# Patient Record
Sex: Male | Born: 1935 | Race: White | Hispanic: No | Smoking: Never smoker
Health system: Southern US, Community
[De-identification: ages and names within clinical notes are randomized; demographics above are authoritative.]

## PROBLEM LIST (undated history)

## (undated) DIAGNOSIS — I2510 Atherosclerotic heart disease of native coronary artery without angina pectoris: Secondary | ICD-10-CM

## (undated) DIAGNOSIS — I10 Essential (primary) hypertension: Secondary | ICD-10-CM

## (undated) DIAGNOSIS — C61 Malignant neoplasm of prostate: Secondary | ICD-10-CM

## (undated) DIAGNOSIS — N170 Acute kidney failure with tubular necrosis: Secondary | ICD-10-CM

## (undated) DIAGNOSIS — J1282 Pneumonia due to coronavirus disease 2019: Secondary | ICD-10-CM

## (undated) DIAGNOSIS — M109 Gout, unspecified: Secondary | ICD-10-CM

## (undated) DIAGNOSIS — N200 Calculus of kidney: Secondary | ICD-10-CM

## (undated) DIAGNOSIS — R32 Unspecified urinary incontinence: Secondary | ICD-10-CM

## (undated) DIAGNOSIS — N189 Chronic kidney disease, unspecified: Secondary | ICD-10-CM

## (undated) DIAGNOSIS — M5090 Cervical disc disorder, unspecified, unspecified cervical region: Secondary | ICD-10-CM

## (undated) DIAGNOSIS — Z955 Presence of coronary angioplasty implant and graft: Secondary | ICD-10-CM

## (undated) DIAGNOSIS — U071 COVID-19: Secondary | ICD-10-CM

## (undated) DIAGNOSIS — Z950 Presence of cardiac pacemaker: Secondary | ICD-10-CM

## (undated) HISTORY — DX: Presence of coronary angioplasty implant and graft: Z95.5

## (undated) HISTORY — DX: Cervical disc disorder, unspecified, unspecified cervical region: M50.90

## (undated) HISTORY — PX: SPIROMETRY: SHX456

## (undated) HISTORY — DX: Malignant neoplasm of prostate: C61

## (undated) HISTORY — DX: COVID-19: U07.1

## (undated) HISTORY — PX: SACROILIAC JOINT ARTHRODESIS: SUR61

## (undated) HISTORY — DX: Essential (primary) hypertension: I10

## (undated) HISTORY — PX: PROSTATECTOMY: SHX69

## (undated) HISTORY — PX: OTHER SURGICAL HISTORY: SHX169

## (undated) HISTORY — DX: Chronic kidney disease, unspecified: N18.9

## (undated) HISTORY — PX: ANKLE FRACTURE SURGERY: SHX122

## (undated) HISTORY — PX: COLONOSCOPY: SHX174

## (undated) HISTORY — PX: ROTATOR CUFF REPAIR: SHX139

## (undated) HISTORY — PX: EXPLORATORY LAPAROTOMY: SUR591

## (undated) HISTORY — DX: Pneumonia due to coronavirus disease 2019: J12.82

## (undated) HISTORY — DX: Unspecified urinary incontinence: R32

## (undated) HISTORY — DX: Presence of cardiac pacemaker: Z95.0

## (undated) HISTORY — DX: Gout, unspecified: M10.9

## (undated) HISTORY — DX: Calculus of kidney: N20.0

## (undated) HISTORY — DX: Acute kidney failure with tubular necrosis: N17.0

## (undated) HISTORY — DX: Atherosclerotic heart disease of native coronary artery without angina pectoris: I25.10

## (undated) HISTORY — PX: CERVICAL DISCECTOMY: SHX98

## (undated) HISTORY — PX: RADIOFREQUENCY ABLATION: SHX2290

---

## 2019-11-17 ENCOUNTER — Other Ambulatory Visit: Payer: Self-pay

## 2019-11-17 ENCOUNTER — Ambulatory Visit (INDEPENDENT_AMBULATORY_CARE_PROVIDER_SITE_OTHER): Payer: Medicare FFS | Admitting: Cardiology

## 2019-11-17 ENCOUNTER — Encounter: Payer: Self-pay | Admitting: Cardiology

## 2019-11-17 DIAGNOSIS — I10 Essential (primary) hypertension: Secondary | ICD-10-CM | POA: Diagnosis not present

## 2019-11-17 DIAGNOSIS — M109 Gout, unspecified: Secondary | ICD-10-CM | POA: Insufficient documentation

## 2019-11-17 DIAGNOSIS — D510 Vitamin B12 deficiency anemia due to intrinsic factor deficiency: Secondary | ICD-10-CM

## 2019-11-17 DIAGNOSIS — Z950 Presence of cardiac pacemaker: Secondary | ICD-10-CM | POA: Insufficient documentation

## 2019-11-17 DIAGNOSIS — K219 Gastro-esophageal reflux disease without esophagitis: Secondary | ICD-10-CM | POA: Insufficient documentation

## 2019-11-17 DIAGNOSIS — M1990 Unspecified osteoarthritis, unspecified site: Secondary | ICD-10-CM

## 2019-11-17 DIAGNOSIS — E7800 Pure hypercholesterolemia, unspecified: Secondary | ICD-10-CM | POA: Diagnosis not present

## 2019-11-17 NOTE — Patient Instructions (Signed)
Medication Instructions:  Your physician recommends that you continue on your current medications as directed. Please refer to the Current Medication list given to you today.  *If you need a refill on your cardiac medications before your next appointment, please call your pharmacy*  Lab Work: None.  If you have labs (blood work) drawn today and your tests are completely normal, you will receive your results only by: Marland Kitchen MyChart Message (if you have MyChart) OR . A paper copy in the mail If you have any lab test that is abnormal or we need to change your treatment, we will call you to review the results.  Testing/Procedures: None.   Follow-Up: At [facility], you and your health needs are our priority.  As part of our continuing mission to provide you with exceptional heart care, we have created designated Provider Care Teams.  These Care Teams include your primary Cardiologist (physician) and Advanced Practice Providers (APPs -  Physician Assistants and Nurse Practitioners) who all work together to provide you with the care you need, when you need it.  Your next appointment:   6 week(s)  The format for your next appointment:   In Person  Provider:   Jenne Campus, MD  Other Instructions   Please come to office Friday Feb. 19th at 10 am to have device interrogated.

## 2019-11-17 NOTE — Progress Notes (Signed)
Cardiology Consultation:    Date:  11/17/2019   ID:  Steven Rosario, DOB Sep 02, 1929, MRN [id number]  PCP:  Steven Sheriff, MD  Cardiologist:  Steven Campus, MD   Referring MD: Steven Sheriff, MD   No chief complaint on file. I need to be established with cardiologist service  History of Present Illness:    Steven Rosario is a 86 y.o. male who is being seen today for the evaluation of history of pacemaker at the request of Steven Rosario, Steven Blonder, MD.  Very nice gentleman who recently relocated from [place].  His wife of more than 60 years died 2 years ago since that time he is being very lonely decided to move into [place] to be closer to his son.  Cardiac wise seems to be doing well.  In 2018 he received a pacemaker he is not sure exactly why he got it he told me that Dr. Michela Rosario that he needed so he got it.  Since that time he seems to be doing well cardiac wise.  Denies having any myocardial infarction, no shortness of breath. Within last few months however he required 2 abdominal surgeries he is not sure exactly why the surgery was done but eventually he said he got significant portion of his small bowel removed.  He went to surgery with no major issues and seems to be doing well overall.  He purchased a house here in [place] and decided to leave here.  In spite of his advanced age 39 years old he is living alone.  His son is leaving for 1/2 miles away from his house.  History reviewed. No pertinent past medical history.  History reviewed. No pertinent surgical history.  Current Medications: Current Meds  Medication Sig  . allopurinol (ZYLOPRIM) 300 MG tablet Take 300 mg by mouth daily.  Marland Kitchen amiodarone (PACERONE) 200 MG tablet Take 200 mg by mouth daily.  Marland Kitchen amLODipine (NORVASC) 10 MG tablet Take 10 mg by mouth daily.  Marland Kitchen apixaban (ELIQUIS) 2.5 MG TABS tablet Take 2.5 mg by mouth 2 (two) times daily.  . Ascorbic Acid (VITA-C PO) Take 500 mg by mouth daily.  Marland Kitchen aspirin 81  MG EC tablet Take 81 mg by mouth daily.  Marland Kitchen atorvastatin (LIPITOR) 20 MG tablet Take 20 mg by mouth daily.  . bumetanide (BUMEX) 1 MG tablet Take 1 mg by mouth daily.  . Cholecalciferol (VITAMIN D-3 PO) Take 2,000 Units by mouth daily.  Marland Kitchen GLUCOSAMINE-CHONDROITIN PO Take 1 tablet by mouth in the morning and at bedtime. 5 mg twice a day  . metoprolol succinate (TOPROL-XL) 50 MG 24 hr tablet Take 50 mg by mouth daily. Take with or immediately following a meal.  . oxybutynin (DITROPAN-XL) 5 MG 24 hr tablet Take 5 mg by mouth at bedtime.  . pantoprazole (PROTONIX) 20 MG tablet Take 20 mg by mouth daily.  . vitamin B-12 (CYANOCOBALAMIN) 1000 MCG tablet Take 1,000 mcg by mouth daily.     Allergies:   Patient has no allergy information on record.   Social History   Socioeconomic History  . Marital status: Unknown    Spouse name: Not on file  . Number of children: Not on file  . Years of education: Not on file  . Highest education level: Not on file  Occupational History  . Not on file  Tobacco Use  . Smoking status: Never Smoker  . Smokeless tobacco: Never Used  Substance and Sexual Activity  . Alcohol  use: Not on file  . Drug use: Not on file  . Sexual activity: Not on file  Other Topics Concern  . Not on file  Social History Narrative  . Not on file   Social Determinants of Health   Financial Resource Strain:   . Difficulty of Paying Living Expenses: Not on file  Food Insecurity:   . Worried About Charity fundraiser in the Last Year: Not on file  . Ran Out of Food in the Last Year: Not on file  Transportation Needs:   . Lack of Transportation (Medical): Not on file  . Lack of Transportation (Non-Medical): Not on file  Physical Activity:   . Days of Exercise per Week: Not on file  . Minutes of Exercise per Session: Not on file  Stress:   . Feeling of Stress : Not on file  Social Connections:   . Frequency of Communication with Friends and Family: Not on file  . Frequency  of Social Gatherings with Friends and Family: Not on file  . Attends Religious Services: Not on file  . Active Member of Clubs or Organizations: Not on file  . Attends Archivist Meetings: Not on file  . Marital Status: Not on file     Family History: The patient's family history is not on file. ROS:   Please see the history of present illness.    All 14 point review of systems negative except as described per history of present illness.  EKGs/Labs/Other Studies Reviewed:    The following studies were reviewed today:   EKG:  EKG is  ordered today.  The ekg ordered today demonstrates normal sinus rhythm, prolonged PR interval, questionable atrial pacing.  Intraventricular conduction delay left bundle branch block type.  Recent Labs: No results found for requested labs within last 8760 hours.  Recent Lipid Panel No results found for: CHOL, TRIG, HDL, CHOLHDL, VLDL, LDLCALC, LDLDIRECT  Physical Exam:    VS:  BP 124/64   Pulse 61   Ht 5\' 10"  (1.778 m)   Wt 182 lb 9.6 oz (82.8 kg)   SpO2 97%   BMI 26.20 kg/m     Wt Readings from Last 3 Encounters:  11/17/19 182 lb 9.6 oz (82.8 kg)     GEN:  Well nourished, well developed in no acute distress HEENT: Normal NECK: No JVD; No carotid bruits LYMPHATICS: No lymphadenopathy CARDIAC: RRR, no murmurs, no rubs, no gallops RESPIRATORY:  Clear to auscultation without rales, wheezing or rhonchi, pacemaker located in the left infraclavicular area skin is intact on top of it. ABDOMEN: Soft, non-tender, non-distended MUSCULOSKELETAL:  No edema; No deformity  SKIN: Warm and dry NEUROLOGIC:  Alert and oriented x 3 PSYCHIATRIC:  Normal affect   ASSESSMENT:    1. Essential hypertension, benign   2. Hypercholesteremia   3. Pacemaker   4. Senile arthritis   5. Pernicious anemia    PLAN:    In order of problems listed above:  1. Pacemaker which is central device.  Implanted 2018.  We will make arrangements for pacemaker  rep to come and interrogate the device as well as set him up with our pacemaker clinic. 2. Essential hypertension blood pressure appears to be well controlled continue present management. 3. Swelling of lower extremities I will ask him to have an echocardiogram to assess left ventricle ejection fraction, however he said he most likely had echocardiogram done recently where she was in the hospital because of abdominal catastrophe that required  2 surgeries.  We will try to retrieve report of that.  If we will get it no need to repeat the test. 4. Dyslipidemia.  I will ask primary care physician to send me a copy of his fasting lipid profile.  That was a very long visit but it was a pleasure to talk to this nice gentleman.  Also spoke over the phone with his son named Maxximus as well and all questions were answered to their satisfaction.  Medication Adjustments/Labs and Tests Ordered: Current medicines are reviewed at length with the patient today.  Concerns regarding medicines are outlined above.  No orders of the defined types were placed in this encounter.  No orders of the defined types were placed in this encounter.   Signed, Park Liter, MD, [facility]. 11/17/2019 2:17 PM    [facility]

## 2019-11-19 ENCOUNTER — Telehealth: Payer: Self-pay | Admitting: Emergency Medicine

## 2019-11-19 NOTE — Telephone Encounter (Signed)
Called patient and let him know to post pone coming to office tomorrow morning for pacemaker interrogation due to weather. I let him know we will call him and let him know what time will work, he verbally understood.

## 2019-11-20 NOTE — Telephone Encounter (Signed)
Left message for patient to return call to come have his pacemaker interrogated today at 1 pm.

## 2019-12-31 ENCOUNTER — Telehealth: Payer: Self-pay | Admitting: Cardiology

## 2019-12-31 NOTE — Telephone Encounter (Signed)
Patient wanted to know if the office got the paperwork from Dr. Lianne Bushy (sp?)  in [place]. Please advise

## 2019-12-31 NOTE — Telephone Encounter (Signed)
Follow up     Patient is calling to see if we have received medical records from his doctor in [place].  He has an appt tomorrow and do not want to come if we have not received the office notes.  Please call and let him know

## 2020-01-01 ENCOUNTER — Ambulatory Visit (INDEPENDENT_AMBULATORY_CARE_PROVIDER_SITE_OTHER): Payer: Medicare FFS | Admitting: Cardiology

## 2020-01-01 ENCOUNTER — Other Ambulatory Visit: Payer: Self-pay

## 2020-01-01 ENCOUNTER — Encounter: Payer: Self-pay | Admitting: Cardiology

## 2020-01-01 VITALS — BP 132/70 | HR 71 | Ht 70.0 in | Wt 192.0 lb

## 2020-01-01 DIAGNOSIS — Z950 Presence of cardiac pacemaker: Secondary | ICD-10-CM

## 2020-01-01 DIAGNOSIS — E7800 Pure hypercholesterolemia, unspecified: Secondary | ICD-10-CM | POA: Diagnosis not present

## 2020-01-01 DIAGNOSIS — R0600 Dyspnea, unspecified: Secondary | ICD-10-CM

## 2020-01-01 DIAGNOSIS — I10 Essential (primary) hypertension: Secondary | ICD-10-CM

## 2020-01-01 DIAGNOSIS — R0609 Other forms of dyspnea: Secondary | ICD-10-CM

## 2020-01-01 NOTE — Progress Notes (Signed)
Cardiology Office Note:    Date:  01/01/2020   ID:  Steven Rosario, DOB 1928/10/10, MRN [id number]  PCP:  Angelina Sheriff, MD  Cardiologist:  Jenne Campus, MD    Referring MD: Angelina Sheriff, MD   No chief complaint on file. Doing well  History of Present Illness:    Steven Rosario is a 86 y.o. male with past medical history significant for prostatic implantation done about 3 years ago, essential hypertension.  He recently relocated from [place] to [place] after he is wife of 64 years died 3 years ago.  He was lonely at home during he decided to move back to the [place] where he sounds leads.  Cardiac wise he does have history of pacemaker however he is not sure exactly why.  We were so far unsuccessful in getting his records from his prior cardiologist.  We will continue our effort.  His device has been interrogated and functioning properly.  Also recently he ended up having 2 abdominal surgeries not sure exactly what was the reason for that and I do not have documentation for it in spite of multiple trials of getting it.  Comes today 2 months for follow-up.  He got his old house care is very happy with it he is very busy working they are getting the house ready.  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  And he is very passionate about this.  He is talking for about 20 minutes about that.  Denies having any unusual shortness of breath, he does have some swelling of lower extremities of the meantime.  No chest pain, no tightness, no pressure no burning in the chest.  History reviewed. No pertinent past medical history.  History reviewed. No pertinent surgical history.  Current Medications: Current Meds  Medication Sig  . allopurinol (ZYLOPRIM) 300 MG tablet Take 300 mg by mouth daily.  Marland Kitchen amiodarone (PACERONE) 200 MG tablet Take 200 mg by mouth daily.  Marland Kitchen amLODipine (NORVASC) 10 MG tablet Take 10 mg by mouth daily.  Marland Kitchen apixaban (ELIQUIS) 2.5 MG TABS tablet Take 2.5 mg by  mouth 2 (two) times daily.  . Ascorbic Acid (VITA-C PO) Take 500 mg by mouth daily.  Marland Kitchen aspirin 81 MG EC tablet Take 81 mg by mouth daily.  Marland Kitchen atorvastatin (LIPITOR) 20 MG tablet Take 20 mg by mouth daily.  . bumetanide (BUMEX) 1 MG tablet Take 1 mg by mouth daily.  . Cholecalciferol (VITAMIN D-3 PO) Take 2,000 Units by mouth daily.  Marland Kitchen GLUCOSAMINE-CHONDROITIN PO Take 1 tablet by mouth in the morning and at bedtime. 5 mg twice a day  . metoprolol succinate (TOPROL-XL) 50 MG 24 hr tablet Take 50 mg by mouth daily. Take with or immediately following a meal.  . oxybutynin (DITROPAN-XL) 5 MG 24 hr tablet Take 5 mg by mouth at bedtime.  . pantoprazole (PROTONIX) 20 MG tablet Take 20 mg by mouth daily.  . vitamin B-12 (CYANOCOBALAMIN) 1000 MCG tablet Take 1,000 mcg by mouth daily.     Allergies:   Patient has no allergy information on record.   Social History   Socioeconomic History  . Marital status: Unknown    Spouse name: Not on file  . Number of children: Not on file  . Years of education: Not on file  . Highest education level: Not on file  Occupational History  . Not on file  Tobacco Use  . Smoking status: Never Smoker  . Smokeless tobacco: Never  Used  Substance and Sexual Activity  . Alcohol use: Not on file  . Drug use: Not on file  . Sexual activity: Not on file  Other Topics Concern  . Not on file  Social History Narrative  . Not on file   Social Determinants of Health   Financial Resource Strain:   . Difficulty of Paying Living Expenses:   Food Insecurity:   . Worried About Charity fundraiser in the Last Year:   . Arboriculturist in the Last Year:   Transportation Needs:   . Film/video editor (Medical):   Marland Kitchen Lack of Transportation (Non-Medical):   Physical Activity:   . Days of Exercise per Week:   . Minutes of Exercise per Session:   Stress:   . Feeling of Stress :   Social Connections:   . Frequency of Communication with Friends and Family:   . Frequency  of Social Gatherings with Friends and Family:   . Attends Religious Services:   . Active Member of Clubs or Organizations:   . Attends Archivist Meetings:   Marland Kitchen Marital Status:      Family History: The patient's family history is not on file. ROS:   Please see the history of present illness.    All 14 point review of systems negative except as described per history of present illness  EKGs/Labs/Other Studies Reviewed:      Recent Labs: No results found for requested labs within last 8760 hours.  Recent Lipid Panel No results found for: CHOL, TRIG, HDL, CHOLHDL, VLDL, LDLCALC, LDLDIRECT  Physical Exam:    VS:  BP 132/70   Pulse 71   Ht 5\' 10"  (1.778 m)   Wt 192 lb (87.1 kg)   SpO2 99%   BMI 27.55 kg/m     Wt Readings from Last 3 Encounters:  01/01/20 192 lb (87.1 kg)  11/17/19 182 lb 9.6 oz (82.8 kg)     GEN:  Well nourished, well developed in no acute distress HEENT: Normal NECK: No JVD; No carotid bruits LYMPHATICS: No lymphadenopathy CARDIAC: RRR, no murmurs, no rubs, no gallops RESPIRATORY:  Clear to auscultation without rales, wheezing or rhonchi  ABDOMEN: Soft, non-tender, non-distended MUSCULOSKELETAL:  No edema; No deformity  SKIN: Warm and dry LOWER EXTREMITIES: no swelling NEUROLOGIC:  Alert and oriented x 3 PSYCHIATRIC:  Normal affect   ASSESSMENT:    1. Essential hypertension, benign   2. Hypercholesteremia   3. Pacemaker Raymond device implant date 08/23/2017   4. Dyspnea on exertion    PLAN:    In order of problems listed above:  1. Essential hypertension.  His blood pressures well controlled today 120/70.  We will continue present management. 2. Dyslipidemia.  I do have pain hemoglobin is elevated.  Will call primary care physician to get his fasting possible results. 3. I suspect he does have paroxysmal atrial fibrillation since he is taking amiodarone as well as Eliquis but again I have no documentation for it 4. Despite  exertion: Echocardiogram will be done to assess left ventricle ejection fraction.   Medication Adjustments/Labs and Tests Ordered: Current medicines are reviewed at length with the patient today.  Concerns regarding medicines are outlined above.  No orders of the defined types were placed in this encounter.  Medication changes: No orders of the defined types were placed in this encounter.   Signed, Steven Liter, MD, [facility] 01/01/2020 3:57 PM    Steven Rosario

## 2020-01-01 NOTE — Telephone Encounter (Signed)
Spoke with pt to inform him that we have not received paperwork from his doctor in [place]. Pt states that he was told at his last appt with Dr. Raliegh Ip on 2/16 that he would need records in order for anything to be done at his appt today with Dr Raliegh Ip at 3:35 pm. Pt states that he tried to contact his Doctor in [place] and he hoped we do the same.   Pt informed me that the doctor's name is Dr. Chevis Pretty and the clinic name is [facility]. Pt was unsure of the number but I was able to find the clinic while on the phone with pt to verify it was the right location. The phone # is [phone].  I have called the clinic and left a message for the nurse to fax over records to the [place] office and if they have any questions to give [place] a call back. I will try to reach out to the office and the patient again later today.

## 2020-01-01 NOTE — Patient Instructions (Signed)
Medication Instructions:  Your physician recommends that you continue on your current medications as directed. Please refer to the Current Medication list given to you today.  *If you need a refill on your cardiac medications before your next appointment, please call your pharmacy*   Lab Work: None ordered   If you have labs (blood work) drawn today and your tests are completely normal, you will receive your results only by: MyChart Message (if you have MyChart) OR A paper copy in the mail If you have any lab test that is abnormal or we need to change your treatment, we will call you to review the results.   Testing/Procedures: Your physician has requested that you have an echocardiogram. Echocardiography is a painless test that uses sound waves to create images of your heart. It provides your doctor with information about the size and shape of your heart and how well your heart's chambers and valves are working. This procedure takes approximately one hour. There are no restrictions for this procedure.    Follow-Up: At [facility], you and your health needs are our priority.  As part of our continuing mission to provide you with exceptional heart care, we have created designated Provider Care Teams.  These Care Teams include your primary Cardiologist (physician) and Advanced Practice Providers (APPs -  Physician Assistants and Nurse Practitioners) who all work together to provide you with the care you need, when you need it.  We recommend signing up for the patient portal called "MyChart".  Sign up information is provided on this After Visit Summary.  MyChart is used to connect with patients for Virtual Visits (Telemedicine).  Patients are able to view lab/test results, encounter notes, upcoming appointments, etc.  Non-urgent messages can be sent to your provider as well.   To learn more about what you can do with MyChart, go to [email or web address].com.    Your next appointment:   3  month(s)  The format for your next appointment:   In Person  Provider:   Robert Krasowski, MD   Other Instructions None   

## 2020-01-07 ENCOUNTER — Other Ambulatory Visit: Payer: Medicare FFS

## 2020-03-16 ENCOUNTER — Other Ambulatory Visit: Payer: Self-pay | Admitting: Orthopedic Surgery

## 2020-03-16 DIAGNOSIS — M48062 Spinal stenosis, lumbar region with neurogenic claudication: Secondary | ICD-10-CM

## 2020-03-17 ENCOUNTER — Other Ambulatory Visit (HOSPITAL_COMMUNITY): Payer: Self-pay | Admitting: Orthopedic Surgery

## 2020-03-17 DIAGNOSIS — M48062 Spinal stenosis, lumbar region with neurogenic claudication: Secondary | ICD-10-CM

## 2020-04-06 ENCOUNTER — Telehealth: Payer: Self-pay | Admitting: Cardiology

## 2020-04-06 DIAGNOSIS — Z950 Presence of cardiac pacemaker: Secondary | ICD-10-CM

## 2020-04-06 NOTE — Telephone Encounter (Signed)
Stanton Kidney from [facility] in [place] is calling requesting the patient receive a referral for the device clinic due to him not being seen in regards to it since he left their office and needing to establish care in regards to it. Please advise.

## 2020-04-06 NOTE — Telephone Encounter (Signed)
Mary from [facility] called because the patient has not established care with an EP doctor here. I let her know that as of 01/01/2020 phone note we have not received his records. She gave me three numbers to contact their medical records to get the ball rolling. She states someone needs called them in [phone], [phone], and [phone]. His Medical History Number [id number].  If Dr. Agustin Cree wants the pt to be seen by our EP doctors can he please put in a referral.

## 2020-04-06 NOTE — Telephone Encounter (Signed)
Yes, he needs to be seen by our EP

## 2020-04-07 ENCOUNTER — Other Ambulatory Visit: Payer: Self-pay | Admitting: Orthopedic Surgery

## 2020-04-07 DIAGNOSIS — M48062 Spinal stenosis, lumbar region with neurogenic claudication: Secondary | ICD-10-CM

## 2020-04-07 NOTE — Addendum Note (Signed)
Addended by: Ashok Norris on: 04/07/2020 03:04 PM   Modules accepted: Orders

## 2020-04-07 NOTE — Telephone Encounter (Signed)
Referral has been placed. 

## 2020-04-11 ENCOUNTER — Telehealth: Payer: Self-pay | Admitting: Nurse Practitioner

## 2020-04-11 NOTE — Telephone Encounter (Signed)
Phone call to patient to verify medication list and allergies for myelogram procedure. Pt aware he will not need to hold any medications for this procedure. Pre and post procedure instructions reviewed with pt. Pt verbalized understanding.  

## 2020-04-19 ENCOUNTER — Ambulatory Visit
Admission: RE | Admit: 2020-04-19 | Discharge: 2020-04-19 | Disposition: A | Discharge status: Home / Self Care | Payer: Medicare FFS | Source: Ambulatory Visit | Attending: Orthopedic Surgery | Admitting: Orthopedic Surgery

## 2020-04-19 ENCOUNTER — Other Ambulatory Visit: Payer: Self-pay

## 2020-04-19 DIAGNOSIS — M48062 Spinal stenosis, lumbar region with neurogenic claudication: Secondary | ICD-10-CM

## 2020-04-19 MED ORDER — IOPAMIDOL (ISOVUE-M 200) INJECTION 41%: 15.0000 mL | Freq: Once | INTRAMUSCULAR | Status: DC

## 2020-04-19 MED ORDER — ONDANSETRON HCL 4 MG/2ML IJ SOLN: 4.0000 mg | Freq: Four times a day (QID) | INTRAMUSCULAR | Status: DC | PRN

## 2020-04-19 NOTE — Discharge Instructions (Signed)
Myelogram Discharge Instructions  1. Go home and rest quietly for the next 24 hours.  It is important to lie flat for the next 24 hours.  Get up only to go to the restroom.  You may lie in the bed or on a couch on your back, your stomach, your left side or your right side.  You may have one pillow under your head.  You may have pillows between your knees while you are on your side or under your knees while you are on your back.  2. DO NOT drive today.  Recline the seat as far back as it will go, while still wearing your seat belt, on the way home.  3. You may get up to go to the bathroom as needed.  You may sit up for 15-20 minutes to eat.  You may resume your normal diet and medications unless otherwise indicated.  Drink plenty of extra fluids today and tomorrow.  4. The incidence of a spinal headache with nausea and/or vomiting is about 5% (one in 20 patients).  If you develop a headache, lie flat and drink plenty of fluids until the headache goes away.  Caffeinated beverages may be helpful.  If you develop severe nausea and vomiting or a headache that does not go away with flat bed rest, call [phone].  5. You may resume normal activities after your 24 hours of bed rest is over; however, do not exert yourself strongly or do any heavy lifting tomorrow.  6. Call your physician for a follow-up appointment.  

## 2020-05-16 ENCOUNTER — Ambulatory Visit (INDEPENDENT_AMBULATORY_CARE_PROVIDER_SITE_OTHER): Payer: Medicare FFS | Admitting: Cardiology

## 2020-05-16 ENCOUNTER — Encounter: Payer: Self-pay | Admitting: Cardiology

## 2020-05-16 ENCOUNTER — Other Ambulatory Visit: Payer: Self-pay

## 2020-05-16 VITALS — BP 140/68 | HR 61 | Ht 70.0 in | Wt 196.6 lb

## 2020-05-16 DIAGNOSIS — Z950 Presence of cardiac pacemaker: Secondary | ICD-10-CM

## 2020-05-16 DIAGNOSIS — I442 Atrioventricular block, complete: Secondary | ICD-10-CM | POA: Diagnosis not present

## 2020-05-16 DIAGNOSIS — I480 Paroxysmal atrial fibrillation: Secondary | ICD-10-CM | POA: Diagnosis not present

## 2020-05-16 LAB — CUP PACEART INCLINIC DEVICE CHECK
Battery Remaining Longevity: 112 mo
Battery Voltage: 2.99 V
Brady Statistic RA Percent Paced: 97 %
Brady Statistic RV Percent Paced: 50 %
Date Time Interrogation Session: 20210816150400
Implantable Lead Implant Date: 20181123
Implantable Lead Implant Date: 20181123
Implantable Lead Location: 753859
Implantable Lead Location: 753860
Implantable Pulse Generator Implant Date: 20181123
Lead Channel Impedance Value: 400 Ohm
Lead Channel Impedance Value: 425 Ohm
Lead Channel Pacing Threshold Amplitude: 0.75 V
Lead Channel Pacing Threshold Amplitude: 0.875 V
Lead Channel Pacing Threshold Pulse Width: 0.5 ms
Lead Channel Pacing Threshold Pulse Width: 0.5 ms
Lead Channel Sensing Intrinsic Amplitude: 12 mV
Lead Channel Sensing Intrinsic Amplitude: 4 mV
Lead Channel Setting Pacing Amplitude: 1 V
Lead Channel Setting Pacing Amplitude: 1.875
Lead Channel Setting Pacing Pulse Width: 0.5 ms
Lead Channel Setting Sensing Sensitivity: 2 mV
Pulse Gen Model: 2272
Pulse Gen Serial Number: 8957038

## 2020-05-16 NOTE — Patient Instructions (Signed)
Medication Instructions:  Your physician recommends that you continue on your current medications as directed. Please refer to the Current Medication list given to you today.  *If you need a refill on your cardiac medications before your next appointment, please call your pharmacy*   Lab Work: None ordered   Testing/Procedures: None ordered   Follow-Up: Remote monitoring is used to monitor your Pacemaker of ICD from home. This monitoring reduces the number of office visits required to check your device to one time per year. It allows [place] to keep an eye on the functioning of your device to ensure it is working properly. You are scheduled for a device check from home on 08/15/2020. You may send your transmission at any time that day. If you have a wireless device, the transmission will be sent automatically. After your physician reviews your transmission, you will receive a postcard with your next transmission date.  At [facility], you and your health needs are our priority.  As part of our continuing mission to provide you with exceptional heart care, we have created designated Provider Care Teams.  These Care Teams include your primary Cardiologist (physician) and Advanced Practice Providers (APPs -  Physician Assistants and Nurse Practitioners) who all work together to provide you with the care you need, when you need it.  We recommend signing up for the patient portal called "MyChart".  Sign up information is provided on this After Visit Summary.  MyChart is used to connect with patients for Virtual Visits (Telemedicine).  Patients are able to view lab/test results, encounter notes, upcoming appointments, etc.  Non-urgent messages can be sent to your provider as well.   To learn more about what you can do with MyChart, go to [email or web address].    Your next appointment:   1 year(s)  The format for your next appointment:   In Person  Provider:   Allegra Lai, MD   Thank you  for choosing [facility]!!   Trinidad Curet, RN [phone]    Other Instructions

## 2020-05-16 NOTE — Progress Notes (Signed)
Electrophysiology Office Note   Date:  05/17/2020   ID:  Steven Rosario, DOB [birth year], MRN [id number]  PCP:  Angelina Sheriff, MD  Cardiologist:  Agustin Cree Primary Electrophysiologist:  Anzal Bartnick Meredith Leeds, MD    Chief Complaint: pacemaker   History of Present Illness: Steven Rosario is a 86 y.o. male who is being seen today for the evaluation of pacemaker at the request of Park Liter, MD. Presenting today for electrophysiology evaluation.  He has a history significant for coronary artery disease status post stents x2 in 2012, paroxysmal atrial fibrillation on Eliquis, Saint Jude dual-chamber pacemaker implanted 08/23/2017 due to sick sinus syndrome and complete heart block, CKD.  He recently relocated from [place] to [place] after his wife was 72 years died 3 years ago.  Today, he denies symptoms of palpitations, chest pain, shortness of breath, orthopnea, PND, lower extremity edema, claudication, dizziness, presyncope, syncope, bleeding, or neurologic sequela. The patient is tolerating medications without difficulties.  He moved to the area November 2020.  Since then he has been doing well.  He has no complaints today.  He is able to do all of his daily activities without restriction.   Past Medical History:  Diagnosis Date   Acute kidney injury (AKI) with acute tubular necrosis (ATN) (HCC)    Cervical disc disease    Chronic kidney disease    Coronary artery disease    Gout    Hypertension    Nephrolithiasis    Pneumonia due to COVID-19 virus    Presence of stent in LAD coronary artery    Prostate cancer (HCC)    S/P placement of cardiac pacemaker    Urinary incontinence    Past Surgical History:  Procedure Laterality Date   ANKLE FRACTURE SURGERY Right    cardiac stents x2     caudal epidural injection     CERVICAL DISCECTOMY     COLONOSCOPY     EXPLORATORY LAPAROTOMY     lumbar facet joint injection Bilateral    medical  branch block Bilateral    PROSTATECTOMY     RADIOFREQUENCY ABLATION Bilateral    ROTATOR CUFF REPAIR Right    SACROILIAC JOINT ARTHRODESIS Right    SACROILIAC JOINT ARTHRODESIS Bilateral    sacroilliac joint inj     SPIROMETRY       Current Outpatient Medications  Medication Sig Dispense Refill   allopurinol (ZYLOPRIM) 300 MG tablet Take 300 mg by mouth daily.     amiodarone (PACERONE) 200 MG tablet Take 200 mg by mouth daily.     amLODipine (NORVASC) 10 MG tablet Take 10 mg by mouth daily.     apixaban (ELIQUIS) 2.5 MG TABS tablet Take 2.5 mg by mouth 2 (two) times daily.     Ascorbic Acid (VITA-C PO) Take 500 mg by mouth daily.     aspirin 81 MG EC tablet Take 81 mg by mouth daily.     atorvastatin (LIPITOR) 20 MG tablet Take 20 mg by mouth daily.     bumetanide (BUMEX) 1 MG tablet Take 1 mg by mouth daily.     Cholecalciferol (VITAMIN D-3 PO) Take 2,000 Units by mouth daily.     GLUCOSAMINE-CHONDROITIN PO Take 1 tablet by mouth in the morning and at bedtime. 5 mg twice a day     metoprolol succinate (TOPROL-XL) 50 MG 24 hr tablet Take 50 mg by mouth daily. Take with or immediately following a meal.     oxybutynin (DITROPAN-XL) 5 MG  24 hr tablet Take 5 mg by mouth at bedtime.     pantoprazole (PROTONIX) 20 MG tablet Take 20 mg by mouth daily.     vitamin B-12 (CYANOCOBALAMIN) 1000 MCG tablet Take 1,000 mcg by mouth daily.     acetaminophen (TYLENOL) 650 MG CR tablet Take 650 mg by mouth every 8 (eight) hours as needed for pain or fever.     Ferrous Sulfate 140 (45 Fe) MG TBCR Take 1 tablet by mouth 2 (two) times daily.     multivitamin (ONE-A-DAY MEN'S) TABS tablet Take 1 tablet by mouth daily.     nitroGLYCERIN (NITROSTAT) 0.4 MG SL tablet Place 0.4 mg under the tongue every 5 (five) minutes as needed for chest pain.     polyvinyl alcohol (LIQUIFILM TEARS) 1.4 % ophthalmic solution 1.4 drops as needed for dry eyes.     No current facility-administered  medications for this visit.    Allergies:   Patient has no known allergies.   Social History:  The patient  reports that he has never smoked. He has never used smokeless tobacco. He reports current alcohol use.   Family History:  The patient's family history includes Bone cancer in his father; CAD in his father; Diabetes in his mother; Heart disease in his sister; Heart failure in his mother; Lung cancer in his son; Obesity in his brother and mother.    ROS:  Please see the history of present illness.   Otherwise, review of systems is positive for none.   All other systems are reviewed and negative.    PHYSICAL EXAM: VS:  BP 140/68    Pulse 61    Ht 5\' 10"  (1.778 m)    Wt 196 lb 9.6 oz (89.2 kg)    SpO2 100%    BMI 28.21 kg/m  , BMI Body mass index is 28.21 kg/m. GEN: Well nourished, well developed, in no acute distress  HEENT: normal  Neck: no JVD, carotid bruits, or masses Cardiac: RRR; no murmurs, rubs, or gallops,no edema  Respiratory:  clear to auscultation bilaterally, normal work of breathing GI: soft, nontender, nondistended, + BS MS: no deformity or atrophy  Skin: warm and dry, device pocket is well healed Neuro:  Strength and sensation are intact Psych: euthymic mood, full affect  EKG:  EKG is ordered today. Personal review of the ekg ordered shows atrial paced, prolonged AV conduction, diffuse T wave inversions, intermittent ventricular pacing  Device interrogation is reviewed today in detail.  See PaceArt for details.   Recent Labs: No results found for requested labs within last 8760 hours.    Lipid Panel  No results found for: CHOL, TRIG, HDL, CHOLHDL, VLDL, LDLCALC, LDLDIRECT   Wt Readings from Last 3 Encounters:  05/16/20 196 lb 9.6 oz (89.2 kg)  01/01/20 192 lb (87.1 kg)  11/17/19 182 lb 9.6 oz (82.8 kg)      Other studies Reviewed: Additional studies/ records that were reviewed today include: TTE 08/06/2019 Review of the above records today  demonstrates: Overall normal systolic function with mild LVH Normal right ventricular size and function, mild RVH Aortic atherosclerosis   ASSESSMENT AND PLAN:  1.  Complete heart block: Status post Saint Jude dual-chamber pacemaker planted in 2018.  Device functioning appropriately.  No changes.  Notes from his prior cardiologist were reviewed.  2.  Atrial fibrillation: Currently on amiodarone and Eliquis.  Remains in sinus rhythm.  No changes.  3.  Coronary artery disease: Stents x2 in 2012.  No  current chest pain.  3.  Hypertension: Currently well controlled  Case discussed with primary cardiology  Current medicines are reviewed at length with the patient today.   The patient does not have concerns regarding his medicines.  The following changes were made today:  none  Labs/ tests ordered today include:  Orders Placed This Encounter  Procedures   CUP [place]   EKG 12-Lead     Disposition:   FU with Alandis Bluemel 1 year  Signed, Lakeitha Basques Meredith Leeds, MD  05/17/2020 7:49 AM     [address] [place] [address] [phone] (office) [phone] (fax)

## 2020-05-17 ENCOUNTER — Encounter: Payer: Self-pay | Admitting: Cardiology

## 2020-05-20 ENCOUNTER — Other Ambulatory Visit: Payer: Self-pay | Admitting: Cardiology

## 2020-05-25 ENCOUNTER — Telehealth: Payer: Self-pay | Admitting: *Deleted

## 2020-05-25 NOTE — Telephone Encounter (Signed)
Spoke with pt to advise that Merlin monitor is transmitting and that next scheduled transmission on 08/15/20 should be received automatically. Pt verbalizes understanding and appreciation of call.

## 2020-06-08 ENCOUNTER — Encounter: Payer: Self-pay | Admitting: Obstetrics and Gynecology

## 2020-06-10 ENCOUNTER — Other Ambulatory Visit: Payer: Self-pay | Admitting: Nephrology

## 2020-06-10 DIAGNOSIS — N1832 Chronic kidney disease, stage 3b: Secondary | ICD-10-CM

## 2020-06-14 ENCOUNTER — Telehealth: Payer: Self-pay | Admitting: Cardiology

## 2020-06-14 MED ORDER — AMIODARONE HCL 200 MG PO TABS: 200.0000 mg | ORAL_TABLET | Freq: Every day | ORAL | 0 refills | Status: DC

## 2020-06-14 MED ORDER — AMIODARONE HCL 200 MG PO TABS: 200.0000 mg | ORAL_TABLET | Freq: Every day | ORAL | 3 refills | Status: DC

## 2020-06-14 NOTE — Telephone Encounter (Signed)
Both refills for prescription sent to pharmacies.

## 2020-06-14 NOTE — Telephone Encounter (Signed)
Pt c/o medication issue:  1. Name of Medication: amiodarone (PACERONE) 200 MG tablet  2. How are you currently taking this medication (dosage and times per day)? As written  3. Are you having a reaction (difficulty breathing--STAT)? No  4. What is your medication issue? Patient is currently totally out of medication. Would like a 30 day supply to go to local pharmacy since he has no more medication left. Also would like a 90 day supply to go to mail order.

## 2020-06-16 ENCOUNTER — Ambulatory Visit
Admission: RE | Admit: 2020-06-16 | Discharge: 2020-06-16 | Disposition: A | Discharge status: Home / Self Care | Payer: Medicare FFS | Source: Ambulatory Visit | Attending: Nephrology | Admitting: Nephrology

## 2020-06-16 DIAGNOSIS — N1832 Chronic kidney disease, stage 3b: Secondary | ICD-10-CM

## 2020-07-12 ENCOUNTER — Other Ambulatory Visit: Payer: Self-pay | Admitting: Cardiology

## 2020-08-01 ENCOUNTER — Other Ambulatory Visit: Payer: Self-pay | Admitting: Nephrology

## 2020-08-01 DIAGNOSIS — R609 Edema, unspecified: Secondary | ICD-10-CM

## 2020-08-01 DIAGNOSIS — N281 Cyst of kidney, acquired: Secondary | ICD-10-CM

## 2020-08-01 DIAGNOSIS — N1832 Chronic kidney disease, stage 3b: Secondary | ICD-10-CM

## 2020-08-02 DIAGNOSIS — M48061 Spinal stenosis, lumbar region without neurogenic claudication: Secondary | ICD-10-CM | POA: Diagnosis not present

## 2020-08-03 DIAGNOSIS — Z79899 Other long term (current) drug therapy: Secondary | ICD-10-CM | POA: Diagnosis not present

## 2020-08-03 DIAGNOSIS — F4321 Adjustment disorder with depressed mood: Secondary | ICD-10-CM | POA: Diagnosis not present

## 2020-08-03 DIAGNOSIS — Z8546 Personal history of malignant neoplasm of prostate: Secondary | ICD-10-CM | POA: Diagnosis not present

## 2020-08-03 DIAGNOSIS — R413 Other amnesia: Secondary | ICD-10-CM | POA: Diagnosis not present

## 2020-08-03 DIAGNOSIS — N3941 Urge incontinence: Secondary | ICD-10-CM | POA: Diagnosis not present

## 2020-08-03 DIAGNOSIS — K5909 Other constipation: Secondary | ICD-10-CM | POA: Diagnosis not present

## 2020-08-05 ENCOUNTER — Ambulatory Visit
Admission: RE | Admit: 2020-08-05 | Discharge: 2020-08-05 | Disposition: A | Discharge status: Home / Self Care | Payer: Medicare FFS | Source: Ambulatory Visit | Attending: Nephrology | Admitting: Nephrology

## 2020-08-05 ENCOUNTER — Ambulatory Visit
Admission: RE | Admit: 2020-08-05 | Discharge: 2020-08-05 | Disposition: A | Discharge status: Home / Self Care | Payer: Medicare PPO | Source: Ambulatory Visit | Attending: Nephrology | Admitting: Nephrology

## 2020-08-05 DIAGNOSIS — R609 Edema, unspecified: Secondary | ICD-10-CM

## 2020-08-05 DIAGNOSIS — N281 Cyst of kidney, acquired: Secondary | ICD-10-CM

## 2020-08-05 DIAGNOSIS — N1832 Chronic kidney disease, stage 3b: Secondary | ICD-10-CM

## 2020-08-05 DIAGNOSIS — R600 Localized edema: Secondary | ICD-10-CM | POA: Diagnosis not present

## 2020-08-05 DIAGNOSIS — N189 Chronic kidney disease, unspecified: Secondary | ICD-10-CM | POA: Diagnosis not present

## 2020-08-15 ENCOUNTER — Ambulatory Visit (INDEPENDENT_AMBULATORY_CARE_PROVIDER_SITE_OTHER): Payer: Medicare PPO

## 2020-08-15 DIAGNOSIS — I442 Atrioventricular block, complete: Secondary | ICD-10-CM | POA: Diagnosis not present

## 2020-08-15 LAB — CUP PACEART REMOTE DEVICE CHECK
Battery Remaining Longevity: 116 mo
Battery Remaining Percentage: 95.5 %
Battery Voltage: 2.98 V
Brady Statistic AP VP Percent: 98 %
Brady Statistic AP VS Percent: 1 %
Brady Statistic AS VP Percent: 1.1 %
Brady Statistic AS VS Percent: 1 %
Brady Statistic RA Percent Paced: 98 %
Brady Statistic RV Percent Paced: 99 %
Date Time Interrogation Session: 20211115020012
Implantable Lead Implant Date: 20181123
Implantable Lead Implant Date: 20181123
Implantable Lead Location: 753859
Implantable Lead Location: 753860
Implantable Pulse Generator Implant Date: 20181123
Lead Channel Impedance Value: 400 Ohm
Lead Channel Impedance Value: 430 Ohm
Lead Channel Pacing Threshold Amplitude: 0.625 V
Lead Channel Pacing Threshold Amplitude: 0.75 V
Lead Channel Pacing Threshold Pulse Width: 0.5 ms
Lead Channel Pacing Threshold Pulse Width: 0.5 ms
Lead Channel Sensing Intrinsic Amplitude: 12 mV
Lead Channel Sensing Intrinsic Amplitude: 3.1 mV
Lead Channel Setting Pacing Amplitude: 0.875
Lead Channel Setting Pacing Amplitude: 1.75 V
Lead Channel Setting Pacing Pulse Width: 0.5 ms
Lead Channel Setting Sensing Sensitivity: 2 mV
Pulse Gen Model: 2272
Pulse Gen Serial Number: 8957038

## 2020-08-16 NOTE — Progress Notes (Signed)
Remote pacemaker transmission.   

## 2020-09-01 DIAGNOSIS — F4321 Adjustment disorder with depressed mood: Secondary | ICD-10-CM | POA: Diagnosis not present

## 2020-09-01 DIAGNOSIS — N3941 Urge incontinence: Secondary | ICD-10-CM | POA: Diagnosis not present

## 2020-09-01 DIAGNOSIS — R413 Other amnesia: Secondary | ICD-10-CM | POA: Diagnosis not present

## 2020-09-01 DIAGNOSIS — K5909 Other constipation: Secondary | ICD-10-CM | POA: Diagnosis not present

## 2020-09-05 DIAGNOSIS — M48062 Spinal stenosis, lumbar region with neurogenic claudication: Secondary | ICD-10-CM | POA: Diagnosis not present

## 2020-10-04 DIAGNOSIS — N3941 Urge incontinence: Secondary | ICD-10-CM | POA: Diagnosis not present

## 2020-10-04 DIAGNOSIS — F4321 Adjustment disorder with depressed mood: Secondary | ICD-10-CM | POA: Diagnosis not present

## 2020-10-04 DIAGNOSIS — Z8546 Personal history of malignant neoplasm of prostate: Secondary | ICD-10-CM | POA: Diagnosis not present

## 2020-10-14 DIAGNOSIS — M1990 Unspecified osteoarthritis, unspecified site: Secondary | ICD-10-CM | POA: Diagnosis not present

## 2020-10-14 DIAGNOSIS — Z0000 Encounter for general adult medical examination without abnormal findings: Secondary | ICD-10-CM | POA: Diagnosis not present

## 2020-10-14 DIAGNOSIS — F331 Major depressive disorder, recurrent, moderate: Secondary | ICD-10-CM | POA: Diagnosis not present

## 2020-11-14 ENCOUNTER — Ambulatory Visit (INDEPENDENT_AMBULATORY_CARE_PROVIDER_SITE_OTHER): Payer: Medicare PPO

## 2020-11-14 DIAGNOSIS — I129 Hypertensive chronic kidney disease with stage 1 through stage 4 chronic kidney disease, or unspecified chronic kidney disease: Secondary | ICD-10-CM | POA: Diagnosis not present

## 2020-11-14 DIAGNOSIS — R609 Edema, unspecified: Secondary | ICD-10-CM | POA: Diagnosis not present

## 2020-11-14 DIAGNOSIS — D631 Anemia in chronic kidney disease: Secondary | ICD-10-CM | POA: Diagnosis not present

## 2020-11-14 DIAGNOSIS — N2581 Secondary hyperparathyroidism of renal origin: Secondary | ICD-10-CM | POA: Diagnosis not present

## 2020-11-14 DIAGNOSIS — N281 Cyst of kidney, acquired: Secondary | ICD-10-CM | POA: Diagnosis not present

## 2020-11-14 DIAGNOSIS — E785 Hyperlipidemia, unspecified: Secondary | ICD-10-CM | POA: Diagnosis not present

## 2020-11-14 DIAGNOSIS — N1832 Chronic kidney disease, stage 3b: Secondary | ICD-10-CM | POA: Diagnosis not present

## 2020-11-14 DIAGNOSIS — I442 Atrioventricular block, complete: Secondary | ICD-10-CM | POA: Diagnosis not present

## 2020-11-16 LAB — CUP PACEART REMOTE DEVICE CHECK
Battery Remaining Longevity: 114 mo
Battery Remaining Percentage: 95.5 %
Battery Voltage: 2.98 V
Brady Statistic AP VP Percent: 99 %
Brady Statistic AP VS Percent: 1 %
Brady Statistic AS VP Percent: 1 %
Brady Statistic AS VS Percent: 1 %
Brady Statistic RA Percent Paced: 99 %
Brady Statistic RV Percent Paced: 99 %
Date Time Interrogation Session: 20220214224140
Implantable Lead Implant Date: 20181123
Implantable Lead Implant Date: 20181123
Implantable Lead Location: 753859
Implantable Lead Location: 753860
Implantable Pulse Generator Implant Date: 20181123
Lead Channel Impedance Value: 390 Ohm
Lead Channel Impedance Value: 430 Ohm
Lead Channel Pacing Threshold Amplitude: 0.625 V
Lead Channel Pacing Threshold Amplitude: 0.875 V
Lead Channel Pacing Threshold Pulse Width: 0.5 ms
Lead Channel Pacing Threshold Pulse Width: 0.5 ms
Lead Channel Sensing Intrinsic Amplitude: 12 mV
Lead Channel Sensing Intrinsic Amplitude: 2.4 mV
Lead Channel Setting Pacing Amplitude: 0.875
Lead Channel Setting Pacing Amplitude: 1.875
Lead Channel Setting Pacing Pulse Width: 0.5 ms
Lead Channel Setting Sensing Sensitivity: 2 mV
Pulse Gen Model: 2272
Pulse Gen Serial Number: 8957038

## 2020-11-17 NOTE — Progress Notes (Signed)
Remote pacemaker transmission.   

## 2020-12-19 DIAGNOSIS — M47816 Spondylosis without myelopathy or radiculopathy, lumbar region: Secondary | ICD-10-CM | POA: Diagnosis not present

## 2020-12-20 ENCOUNTER — Telehealth: Payer: Self-pay

## 2020-12-20 NOTE — Telephone Encounter (Signed)
Medical records request sent to [facility]

## 2020-12-26 ENCOUNTER — Telehealth: Payer: Self-pay

## 2021-01-13 ENCOUNTER — Emergency Department (HOSPITAL_COMMUNITY): Payer: Medicare PPO

## 2021-01-13 ENCOUNTER — Emergency Department (HOSPITAL_COMMUNITY)
Admission: EM | Admit: 2021-01-13 | Discharge: 2021-01-13 | Disposition: A | Discharge status: Home / Self Care | Payer: Medicare PPO | Attending: Emergency Medicine | Admitting: Emergency Medicine

## 2021-01-13 DIAGNOSIS — Z950 Presence of cardiac pacemaker: Secondary | ICD-10-CM | POA: Diagnosis not present

## 2021-01-13 DIAGNOSIS — Z8616 Personal history of COVID-19: Secondary | ICD-10-CM | POA: Diagnosis not present

## 2021-01-13 DIAGNOSIS — S20212A Contusion of left front wall of thorax, initial encounter: Secondary | ICD-10-CM | POA: Insufficient documentation

## 2021-01-13 DIAGNOSIS — Z8546 Personal history of malignant neoplasm of prostate: Secondary | ICD-10-CM | POA: Insufficient documentation

## 2021-01-13 DIAGNOSIS — I129 Hypertensive chronic kidney disease with stage 1 through stage 4 chronic kidney disease, or unspecified chronic kidney disease: Secondary | ICD-10-CM | POA: Diagnosis not present

## 2021-01-13 DIAGNOSIS — Z7982 Long term (current) use of aspirin: Secondary | ICD-10-CM | POA: Diagnosis not present

## 2021-01-13 DIAGNOSIS — R410 Disorientation, unspecified: Secondary | ICD-10-CM | POA: Insufficient documentation

## 2021-01-13 DIAGNOSIS — Z7901 Long term (current) use of anticoagulants: Secondary | ICD-10-CM | POA: Insufficient documentation

## 2021-01-13 DIAGNOSIS — N189 Chronic kidney disease, unspecified: Secondary | ICD-10-CM | POA: Insufficient documentation

## 2021-01-13 DIAGNOSIS — Z79899 Other long term (current) drug therapy: Secondary | ICD-10-CM | POA: Diagnosis not present

## 2021-01-13 DIAGNOSIS — I2510 Atherosclerotic heart disease of native coronary artery without angina pectoris: Secondary | ICD-10-CM | POA: Insufficient documentation

## 2021-01-13 DIAGNOSIS — W19XXXA Unspecified fall, initial encounter: Secondary | ICD-10-CM | POA: Insufficient documentation

## 2021-01-13 DIAGNOSIS — I6523 Occlusion and stenosis of bilateral carotid arteries: Secondary | ICD-10-CM | POA: Diagnosis not present

## 2021-01-13 DIAGNOSIS — Y92008 Other place in unspecified non-institutional (private) residence as the place of occurrence of the external cause: Secondary | ICD-10-CM | POA: Diagnosis not present

## 2021-01-13 DIAGNOSIS — I10 Essential (primary) hypertension: Secondary | ICD-10-CM | POA: Diagnosis not present

## 2021-01-13 DIAGNOSIS — I6782 Cerebral ischemia: Secondary | ICD-10-CM | POA: Diagnosis not present

## 2021-01-13 DIAGNOSIS — R0789 Other chest pain: Secondary | ICD-10-CM | POA: Diagnosis not present

## 2021-01-13 DIAGNOSIS — S0990XA Unspecified injury of head, initial encounter: Secondary | ICD-10-CM | POA: Diagnosis not present

## 2021-01-13 DIAGNOSIS — S0011XA Contusion of right eyelid and periocular area, initial encounter: Secondary | ICD-10-CM | POA: Insufficient documentation

## 2021-01-13 DIAGNOSIS — J9811 Atelectasis: Secondary | ICD-10-CM | POA: Diagnosis not present

## 2021-01-13 DIAGNOSIS — G319 Degenerative disease of nervous system, unspecified: Secondary | ICD-10-CM | POA: Diagnosis not present

## 2021-01-13 LAB — CBC WITH DIFFERENTIAL/PLATELET
Abs Immature Granulocytes: 0.05 10*3/uL (ref 0.00–0.07)
Basophils Absolute: 0.1 10*3/uL (ref 0.0–0.1)
Basophils Relative: 1 %
Eosinophils Absolute: 0.3 10*3/uL (ref 0.0–0.5)
Eosinophils Relative: 3 %
HCT: 42.4 % (ref 39.0–52.0)
Hemoglobin: 13.7 g/dL (ref 13.0–17.0)
Immature Granulocytes: 1 %
Lymphocytes Relative: 28 %
Lymphs Abs: 2.7 10*3/uL (ref 0.7–4.0)
MCH: 30.9 pg (ref 26.0–34.0)
MCHC: 32.3 g/dL (ref 30.0–36.0)
MCV: 95.7 fL (ref 80.0–100.0)
Monocytes Absolute: 0.9 10*3/uL (ref 0.1–1.0)
Monocytes Relative: 10 %
Neutro Abs: 5.7 10*3/uL (ref 1.7–7.7)
Neutrophils Relative %: 57 %
Platelets: 182 10*3/uL (ref 150–400)
RBC: 4.43 MIL/uL (ref 4.22–5.81)
RDW: 14.6 % (ref 11.5–15.5)
WBC: 9.7 10*3/uL (ref 4.0–10.5)
nRBC: 0 % (ref 0.0–0.2)

## 2021-01-13 LAB — COMPREHENSIVE METABOLIC PANEL
ALT: 17 U/L (ref 0–44)
AST: 29 U/L (ref 15–41)
Albumin: 3.9 g/dL (ref 3.5–5.0)
Alkaline Phosphatase: 93 U/L (ref 38–126)
Anion gap: 11 (ref 5–15)
BUN: 50 mg/dL — ABNORMAL HIGH (ref 8–23)
CO2: 22 mmol/L (ref 22–32)
Calcium: 9.1 mg/dL (ref 8.9–10.3)
Chloride: 109 mmol/L (ref 98–111)
Creatinine, Ser: 2.5 mg/dL — ABNORMAL HIGH (ref 0.61–1.24)
GFR, Estimated: 24 mL/min — ABNORMAL LOW (ref >60)
Glucose, Bld: 116 mg/dL — ABNORMAL HIGH (ref 70–99)
Potassium: 4.2 mmol/L (ref 3.5–5.1)
Sodium: 142 mmol/L (ref 135–145)
Total Bilirubin: 0.5 mg/dL (ref 0.3–1.2)
Total Protein: 6.6 g/dL (ref 6.5–8.1)

## 2021-01-13 LAB — URINALYSIS, ROUTINE W REFLEX MICROSCOPIC
Bacteria, UA: NONE SEEN
Bilirubin Urine: NEGATIVE
Glucose, UA: NEGATIVE mg/dL
Hgb urine dipstick: NEGATIVE
Ketones, ur: NEGATIVE mg/dL
Leukocytes,Ua: NEGATIVE
Nitrite: NEGATIVE
Protein, ur: 30 mg/dL — AB
Specific Gravity, Urine: 1.012 (ref 1.005–1.030)
pH: 5 (ref 5.0–8.0)

## 2021-01-13 NOTE — Discharge Instructions (Addendum)
Please follow-up with your primary care provider first the next week, as scheduled.  Regarding your intermittent delirium, I suspect that your lack of sleep and regular diet are contributing factors, but perhaps there is also a degree of polypharmacy or adverse effects from newly started venlafaxine.  Please discuss this further with your primary care provider.  Regarding your falls, please have a low threshold to return to the ED should you sustain another fall.  You are particularly high risk given that you are on blood thinners.  Fortunately your work-up here in the ED today was reassuring.  While your x-ray did not show a rib fracture, x-rays have low sensitivity for detecting fractures and your exam is consistent with rib contusion versus rib fracture.  Please return to the ED should you develop cough, fevers, shortness of breath, or increased work of breathing.  I have been ordered physical therapy and home health nursing to assist you with your gait instability and help mitigate your risk of future falls.  You should receive a phone call with further instructions soon.  Return to the ER or seek immediate medical attention for any new or worsening symptoms.

## 2021-01-13 NOTE — ED Provider Notes (Signed)
[facility] EMERGENCY DEPARTMENT Provider Note   CSN: [id number] Arrival date & time: 01/13/21  9702     History Chief Complaint  Patient presents with  . Fall    Steven Rosario is a 87 y.o. 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 Curt Bears with [facility] who presents to the ED after sustaining a fall.  On my examination, patient is companied by his son who is at bedside.  He states that he uses a cane to ambulate, but has been unsteady on his feet for months.  He moved here from [place] 3 years ago to live closer to his son after his wife passed away.  He is admittedly depressed and sees a grief counselor regularly.  His son is concerned because 6 weeks ago he was started on venlafaxine and since then has been having progressively worsening intermittent delirium.  However, patient also states that he does not sleep well at all as he stays up late talking to his wife.  His son is convinced that his unsteadiness and fatigue symptoms are at least partially attributable to the fact that he is not eating and drinking well.  He states that after he eats a large meal, he will perk right up.  However, he refuses to learn how to cook and is adamant about not going to SNF or ALF.    Patient did have a fall 2 to 3 days ago, ground-level in the living room that he attributes to his unsteadiness.  He denies any presyncopal prodrome or LOC.  He also denies any numbness, weakness, or any other neurologic deficits.  He does have mild bruising above his right eye and endorses pain over his left anterolateral chest wall.  He denies any abdominal pain, difficulty breathing, recent illness or infection, fevers or chills, cough or shortness of breath, nausea or vomiting, urinary symptoms, or changes in his bowel habits.   HPI     Past  Medical History:  Diagnosis Date  . Acute kidney injury (AKI) with acute tubular necrosis (ATN) (HCC)   . Cervical disc disease   . Chronic kidney disease   . Coronary artery disease   . Gout   . Hypertension   . Nephrolithiasis   . Pneumonia due to COVID-19 virus   . Presence of stent in LAD coronary artery   . Prostate cancer ([place])   . S/P placement of cardiac pacemaker   . Urinary incontinence     Patient Active Problem List   Diagnosis Date Noted  . Dyspnea on exertion 01/01/2020  . Essential hypertension, benign 11/17/2019  . Hypercholesteremia 11/17/2019  . Pacemaker [place] device implant date 08/23/2017 11/17/2019  . Senile arthritis 11/17/2019  . Esophageal reflux 11/17/2019  . Pernicious anemia 11/17/2019  . Gout, unspecified 11/17/2019    Past Surgical History:  Procedure Laterality Date  . ANKLE FRACTURE SURGERY Right   . cardiac stents x2    . caudal epidural injection    . CERVICAL DISCECTOMY    . COLONOSCOPY    . EXPLORATORY LAPAROTOMY    . lumbar facet joint injection Bilateral   . medical branch block Bilateral   . PROSTATECTOMY    . RADIOFREQUENCY ABLATION Bilateral   . ROTATOR CUFF REPAIR Right   . SACROILIAC JOINT ARTHRODESIS Right   . SACROILIAC JOINT ARTHRODESIS Bilateral   .  sacroilliac joint inj    . SPIROMETRY         Family History  Problem Relation Age of Onset  . Diabetes Mother   . Obesity Mother   . Heart failure Mother   . CAD Father   . Bone cancer Father   . Heart disease Sister   . Obesity Brother   . Lung cancer Son     Social History   Tobacco Use  . Smoking status: Never Smoker  . Smokeless tobacco: Never Used  Substance Use Topics  . Alcohol use: Yes    Comment: very rare, once per year    Home Medications Prior to Admission medications   Medication Sig Start Date End Date Taking? Authorizing Provider  acetaminophen (TYLENOL) 650 MG CR tablet Take 650 mg by mouth every 8 (eight) hours as needed for pain  or fever.   Yes [provider]  allopurinol (ZYLOPRIM) 300 MG tablet Take 300 mg by mouth daily.   Yes [provider]  amiodarone (PACERONE) 200 MG tablet Take 1 tablet (200 mg total) by mouth daily. 06/14/20  Yes Camnitz, Will Hassell Done, MD  amLODipine (NORVASC) 10 MG tablet Take 10 mg by mouth daily.   Yes [provider]  apixaban (ELIQUIS) 2.5 MG TABS tablet Take 2.5 mg by mouth 2 (two) times daily.   Yes [provider]  Ascorbic Acid (VITA-C PO) Take 500 mg by mouth daily.   Yes [provider]  aspirin 81 MG EC tablet Take 81 mg by mouth daily.   Yes [provider]  atorvastatin (LIPITOR) 20 MG tablet Take 20 mg by mouth daily.   Yes [provider]  bumetanide (BUMEX) 1 MG tablet Take 1 mg by mouth daily.   Yes [provider]  Cholecalciferol (VITAMIN D-3 PO) Take 2,000 Units by mouth daily.   Yes [provider]  Ferrous Sulfate 140 (45 Fe) MG TBCR Take 140 mg by mouth daily.   Yes [provider]  gabapentin (NEURONTIN) 300 MG capsule Take 300 mg by mouth in the morning and at bedtime. 11/14/20  Yes [provider]  GLUCOSAMINE-CHONDROITIN PO Take 1 tablet by mouth in the morning and at bedtime. 5 mg twice a day   Yes [provider]  metoprolol succinate (TOPROL-XL) 50 MG 24 hr tablet Take 50 mg by mouth daily. Take with or immediately following a meal.   Yes [provider]  Multiple Vitamins-Minerals (VITEYES CLASSIC ADVANCED PO) Take 1 tablet by mouth daily.   Yes [provider]  multivitamin (ONE-A-DAY MEN'S) TABS tablet Take 1 tablet by mouth daily.   Yes [provider]  pantoprazole (PROTONIX) 20 MG tablet Take 20 mg by mouth daily.   Yes [provider]  polycarbophil (FIBERCON) 625 MG tablet Take 625 mg by mouth in the morning and at bedtime.   Yes [provider]  solifenacin (VESICARE) 5 MG tablet Take 5 mg by mouth daily.  09/01/20  Yes [provider]  venlafaxine XR (EFFEXOR-XR) 150 MG 24 hr capsule Take 150 mg by mouth at bedtime. 11/22/20  Yes [provider]  vitamin B-12 (CYANOCOBALAMIN) 1000 MCG tablet Take 1,000 mcg by mouth daily.   Yes [provider]    Allergies    Patient has no known allergies.  Review of Systems   Review of Systems  All other systems reviewed and are negative.   Physical Exam Updated Vital Signs BP (!) 160/67   Pulse  61   Temp 97.8 F (36.6 C) (Oral)   Resp 17   SpO2 98%   Physical Exam Vitals and nursing note reviewed. Exam conducted with a chaperone present.  Constitutional:      General: He is not in acute distress.    Appearance: He is not toxic-appearing.  HENT:     Head: Normocephalic.     Comments: Mild ecchymoses over right eyebrow.  No significant swelling or tenderness.  No palpable skull defects Eyes:     General: No scleral icterus.    Extraocular Movements: Extraocular movements intact.     Conjunctiva/sclera: Conjunctivae normal.     Pupils: Pupils are equal, round, and reactive to light.     Comments: PERRL and EOM intact.  No nystagmus.  No entrapment.  Neck:     Comments: No midline cervical tenderness.  ROM intact. Cardiovascular:     Rate and Rhythm: Normal rate and regular rhythm.     Pulses: Normal pulses.  Pulmonary:     Effort: Pulmonary effort is normal. No respiratory distress.     Comments: Breath sounds intact bilaterally.  Moderate anterolateral left-sided chest wall tenderness to palpation.  Mild ecchymoses noted. Chest:     Chest wall: Tenderness present.  Abdominal:     General: Abdomen is flat. There is no distension.     Palpations: Abdomen is soft.     Tenderness: There is no abdominal tenderness. There is no guarding.     Comments: No areas of abdominal tenderness.  Soft, nondistended.  Musculoskeletal:     Cervical back: Normal range of motion. No rigidity.  Skin:    General: Skin is dry.   Neurological:     General: No focal deficit present.     Mental Status: He is alert and oriented to person, place, and time.     GCS: GCS eye subscore is 4. GCS verbal subscore is 5. GCS motor subscore is 6.     Comments: Alert and oriented x3.  CN II to XII grossly intact.  No focal deficits.  Moves all extremities with symmetric strength.  Sensation intact and symmetric throughout.   Psychiatric:        Mood and Affect: Mood normal.        Behavior: Behavior normal.        Thought Content: Thought content normal.     ED Results / Procedures / Treatments   Labs (all labs ordered are listed, but only abnormal results are displayed) Labs Reviewed  URINALYSIS, ROUTINE W REFLEX MICROSCOPIC - Abnormal; Notable for the following components:      Result Value   Protein, ur 30 (*)    All other components within normal limits  COMPREHENSIVE METABOLIC PANEL - Abnormal; Notable for the following components:   Glucose, Bld 116 (*)    BUN 50 (*)    Creatinine, Ser 2.50 (*)    GFR, Estimated 24 (*)    All other components within normal limits  CBC WITH DIFFERENTIAL/PLATELET    EKG EKG Interpretation  Date/Time:  Friday January 13 2021 09:52:42 EDT Ventricular Rate:  64 PR Interval:  66 QRS Duration: 177 QT Interval:  520 QTC Calculation: 537 R Axis:   -77 Text Interpretation: Sinus rhythm Short PR interval Nonspecific IVCD with LAD Left ventricular hypertrophy Confirmed by Thamas Jaegers ([id number]) on 01/13/2021 11:30:23 AM   Radiology DG Ribs Unilateral W/Chest Left  Result Date: 01/13/2021 CLINICAL DATA:  [place] 2 days ago, LEFT rib pain, history hypertension  EXAM: LEFT RIBS AND CHEST - 3+ VIEW COMPARISON:  None FINDINGS: LEFT subclavian sequential transvenous pacemaker leads project at RIGHT atrium and RIGHT ventricle. Normal heart size, mediastinal contours, and pulmonary vascularity. Minimal LEFT basilar atelectasis. Lungs clear. No pulmonary infiltrate, pleural effusion, or  pneumothorax. BBs placed at site of symptoms lower LEFT ribs. No definite rib fracture or bone destruction. IMPRESSION: Minimal LEFT basilar atelectasis. Otherwise negative exam. Electronically Signed   By: Lavonia Dana M.D.   On: 01/13/2021 11:19   CT Head Wo Contrast  Result Date: 01/13/2021 CLINICAL DATA:  Minor head trauma, several recent falls at home EXAM: CT HEAD WITHOUT CONTRAST TECHNIQUE: Contiguous axial images were obtained from the base of the skull through the vertex without intravenous contrast. Sagittal and coronal MPR images reconstructed from axial data set. COMPARISON:  None FINDINGS: Brain: Generalized atrophy. Normal ventricular morphology. No midline shift or mass effect. Small vessel chronic ischemic changes of deep cerebral white matter. No intracranial hemorrhage, mass lesion, evidence of acute infarction, or extra-axial fluid collection. Vascular: No hyperdense vessels. Atherosclerotic calcification of internal carotid arteries at skull base Skull: Intact Sinuses/Orbits: Clear Other: N/A IMPRESSION: Atrophy with small vessel chronic ischemic changes of deep cerebral white matter. No acute intracranial abnormalities. Electronically Signed   By: Lavonia Dana M.D.   On: 01/13/2021 11:25    Procedures Procedures   Medications Ordered in ED Medications - No data to display  ED Course  I have reviewed the triage vital signs and the nursing notes.  Pertinent labs & imaging results that were available during my care of the patient were reviewed by me and considered in my medical decision making (see chart for details).  Clinical Course as of 01/13/21 1444  Fri Jan 13, 2021  1406 DG Ribs Unilateral W/Chest Left [JH]    Clinical Course User Index [JH] Almyra Free Greggory Brandy, MD   MDM Rules/Calculators/A&P                          Steven Rosario was evaluated in Emergency Department on 01/13/2021 for the symptoms described in the history of present illness. He was evaluated in the  context of the global COVID-19 pandemic, which necessitated consideration that the patient might be at risk for infection with the SARS-CoV-2 virus that causes COVID-19. Institutional protocols and algorithms that pertain to the evaluation of patients at risk for COVID-19 are in a state of rapid change based on information released by regulatory bodies including the CDC and federal and state organizations. These policies and algorithms were followed during the patient's care in the ED.  I personally reviewed patient's medical chart and all notes from triage and staff during today's encounter. I have also ordered and reviewed all labs and imaging that I felt to be medically necessary in the evaluation of this patient's complaints and with consideration of their physical exam. If needed, translation services were available and utilized.   While son states that patient has been intermittently delirious, he is alert and oriented x3 on my examination.  He is answering questions appropriately and appears capable of making his own decision making.  He admits that he has been unsteady on his feet for the past few months which has led to multiple trauma or falls.  He is on Eliquis twice daily which renders this is particularly dangerous risk.  He has been attempting to use only a cane, but is agreeable to using a assistive walker.  His  son at bedside plans to bring him to a DME store after today's ED encounter.  I will place face-to-face home health aide and physical therapy consult to help with gait disturbance and to mitigate risk for falls at home.  He is adamant about not going to an assisted living facility or skilled nursing facility.  Son suspects that polypharmacy and new venlafaxine medication is contributing to his delirium and reported nightmares.  However, it is also noted that he is not sleeping well at night because he is depressed from passing of his wife 3 years ago.  Son also states that his eating is  sporadic which may also be contributing factor.  Patient's exam is largely benign.  There is significant amount of tenderness over left anterolateral chest wall.  While chest x-ray was negative for fracture, I suspect that he may have a small nondisplaced fracture.  However, there does not appear to be any complication on chest x-ray and do not feel as though CT is emergently warranted.  This fall was sustained a few days ago and he is breathing without any increased difficulty or oxygen requirement.  Laboratory work-up is unremarkable and his vital signs have been stable here in the ER.  He does have creatinine elevated 2.5 with GFR reduced to 24, but CKD noted and no priors with which to compare.  UA is nonconcerning for infection.  Denies urinary symptoms.  I feel as though it is appropriate to discharge patient home with the face-to-face orders, as mentioned.  He already has a follow-up appointment scheduled with his primary care provider first thing next week.  I do not feel as though further work-up is warranted.  I discussed case with Dr. Almyra Free who agrees with the assessment and plan.   ED return precautions discussed.  Patient voices understanding and is agreeable to the plan.  Final Clinical Impression(s) / ED Diagnoses Final diagnoses:  Fall, initial encounter    Rx / DC Orders ED Discharge Orders         [place]        01/13/21 1420    Face-to-face encounter (required for Medicare/Medicaid patients)       Comments: I Krista Blue certify that this patient is under my care and that I, or a nurse practitioner or physician's assistant working with me, had a face-to-face encounter that meets the physician face-to-face encounter requirements with this patient on 01/13/2021. The encounter with the patient was in whole, or in part for the following medical condition(s) which is the primary reason for home health care (List medical condition): Falls, gait instability   01/13/21  1420           Reita Chard 01/13/21 1444    Luna Fuse, MD 01/13/21 1454

## 2021-01-13 NOTE — ED Triage Notes (Signed)
Pt bib family with complaints of ribcage pain after falling a few days ago. Pt endorses falling face first. Denies LOC. Pt on eliquis. Bruising noted to R eyebrow L ribcage and L elbow. Son reports concerns with venlafaxine.

## 2021-01-16 DIAGNOSIS — Z9181 History of falling: Secondary | ICD-10-CM | POA: Diagnosis not present

## 2021-01-16 DIAGNOSIS — Z6830 Body mass index (BMI) 30.0-30.9, adult: Secondary | ICD-10-CM | POA: Diagnosis not present

## 2021-01-16 DIAGNOSIS — M1990 Unspecified osteoarthritis, unspecified site: Secondary | ICD-10-CM | POA: Diagnosis not present

## 2021-01-16 DIAGNOSIS — F331 Major depressive disorder, recurrent, moderate: Secondary | ICD-10-CM | POA: Diagnosis not present

## 2021-01-16 DIAGNOSIS — Z87898 Personal history of other specified conditions: Secondary | ICD-10-CM | POA: Diagnosis not present

## 2021-01-16 DIAGNOSIS — R410 Disorientation, unspecified: Secondary | ICD-10-CM | POA: Diagnosis not present

## 2021-02-13 ENCOUNTER — Ambulatory Visit (INDEPENDENT_AMBULATORY_CARE_PROVIDER_SITE_OTHER): Payer: Medicare PPO

## 2021-02-13 DIAGNOSIS — I442 Atrioventricular block, complete: Secondary | ICD-10-CM

## 2021-02-14 DIAGNOSIS — M48062 Spinal stenosis, lumbar region with neurogenic claudication: Secondary | ICD-10-CM | POA: Diagnosis not present

## 2021-02-14 LAB — CUP PACEART REMOTE DEVICE CHECK
Battery Remaining Longevity: 113 mo
Battery Remaining Percentage: 95.5 %
Battery Voltage: 2.98 V
Brady Statistic AP VP Percent: 98 %
Brady Statistic AP VS Percent: 1 %
Brady Statistic AS VP Percent: 1.7 %
Brady Statistic AS VS Percent: 1 %
Brady Statistic RA Percent Paced: 98 %
Brady Statistic RV Percent Paced: 99 %
Date Time Interrogation Session: 20220516022649
Implantable Lead Implant Date: 20181123
Implantable Lead Implant Date: 20181123
Implantable Lead Location: 753859
Implantable Lead Location: 753860
Implantable Pulse Generator Implant Date: 20181123
Lead Channel Impedance Value: 390 Ohm
Lead Channel Impedance Value: 410 Ohm
Lead Channel Pacing Threshold Amplitude: 0.625 V
Lead Channel Pacing Threshold Amplitude: 0.75 V
Lead Channel Pacing Threshold Pulse Width: 0.5 ms
Lead Channel Pacing Threshold Pulse Width: 0.5 ms
Lead Channel Sensing Intrinsic Amplitude: 1.5 mV
Lead Channel Sensing Intrinsic Amplitude: 12 mV
Lead Channel Setting Pacing Amplitude: 0.875
Lead Channel Setting Pacing Amplitude: 1.75 V
Lead Channel Setting Pacing Pulse Width: 0.5 ms
Lead Channel Setting Sensing Sensitivity: 2 mV
Pulse Gen Model: 2272
Pulse Gen Serial Number: 8957038

## 2021-02-16 DIAGNOSIS — Z8546 Personal history of malignant neoplasm of prostate: Secondary | ICD-10-CM | POA: Diagnosis not present

## 2021-02-16 DIAGNOSIS — F4321 Adjustment disorder with depressed mood: Secondary | ICD-10-CM | POA: Diagnosis not present

## 2021-02-16 DIAGNOSIS — N3941 Urge incontinence: Secondary | ICD-10-CM | POA: Diagnosis not present

## 2021-03-07 DIAGNOSIS — M48061 Spinal stenosis, lumbar region without neurogenic claudication: Secondary | ICD-10-CM | POA: Diagnosis not present

## 2021-03-07 NOTE — Progress Notes (Signed)
Remote pacemaker transmission.   

## 2021-03-20 DIAGNOSIS — N1832 Chronic kidney disease, stage 3b: Secondary | ICD-10-CM | POA: Diagnosis not present

## 2021-03-20 DIAGNOSIS — D631 Anemia in chronic kidney disease: Secondary | ICD-10-CM | POA: Diagnosis not present

## 2021-03-20 DIAGNOSIS — N189 Chronic kidney disease, unspecified: Secondary | ICD-10-CM | POA: Diagnosis not present

## 2021-03-20 DIAGNOSIS — R609 Edema, unspecified: Secondary | ICD-10-CM | POA: Diagnosis not present

## 2021-03-20 DIAGNOSIS — N281 Cyst of kidney, acquired: Secondary | ICD-10-CM | POA: Diagnosis not present

## 2021-03-20 DIAGNOSIS — I129 Hypertensive chronic kidney disease with stage 1 through stage 4 chronic kidney disease, or unspecified chronic kidney disease: Secondary | ICD-10-CM | POA: Diagnosis not present

## 2021-03-20 DIAGNOSIS — N2581 Secondary hyperparathyroidism of renal origin: Secondary | ICD-10-CM | POA: Diagnosis not present

## 2021-03-20 DIAGNOSIS — W19XXXA Unspecified fall, initial encounter: Secondary | ICD-10-CM | POA: Diagnosis not present

## 2021-03-28 DIAGNOSIS — M48062 Spinal stenosis, lumbar region with neurogenic claudication: Secondary | ICD-10-CM | POA: Diagnosis not present

## 2021-04-04 DIAGNOSIS — M48061 Spinal stenosis, lumbar region without neurogenic claudication: Secondary | ICD-10-CM | POA: Diagnosis not present

## 2021-04-11 DIAGNOSIS — M48062 Spinal stenosis, lumbar region with neurogenic claudication: Secondary | ICD-10-CM | POA: Diagnosis not present

## 2021-04-17 DIAGNOSIS — R600 Localized edema: Secondary | ICD-10-CM | POA: Diagnosis not present

## 2021-04-17 DIAGNOSIS — H01001 Unspecified blepharitis right upper eyelid: Secondary | ICD-10-CM | POA: Diagnosis not present

## 2021-04-17 DIAGNOSIS — M542 Cervicalgia: Secondary | ICD-10-CM | POA: Diagnosis not present

## 2021-04-17 DIAGNOSIS — M25551 Pain in right hip: Secondary | ICD-10-CM | POA: Diagnosis not present

## 2021-04-17 DIAGNOSIS — Z6831 Body mass index (BMI) 31.0-31.9, adult: Secondary | ICD-10-CM | POA: Diagnosis not present

## 2021-04-17 DIAGNOSIS — M25461 Effusion, right knee: Secondary | ICD-10-CM | POA: Diagnosis not present

## 2021-04-20 DIAGNOSIS — R351 Nocturia: Secondary | ICD-10-CM | POA: Diagnosis not present

## 2021-04-20 DIAGNOSIS — N3941 Urge incontinence: Secondary | ICD-10-CM | POA: Diagnosis not present

## 2021-04-20 DIAGNOSIS — R350 Frequency of micturition: Secondary | ICD-10-CM | POA: Diagnosis not present

## 2021-04-22 ENCOUNTER — Emergency Department (HOSPITAL_COMMUNITY)
Admission: EM | Admit: 2021-04-22 | Discharge: 2021-04-22 | Disposition: A | Discharge status: Home / Self Care | Payer: Medicare PPO | Attending: Emergency Medicine | Admitting: Emergency Medicine

## 2021-04-22 ENCOUNTER — Other Ambulatory Visit: Payer: Self-pay | Admitting: Internal Medicine

## 2021-04-22 ENCOUNTER — Emergency Department (HOSPITAL_COMMUNITY): Payer: Medicare PPO

## 2021-04-22 ENCOUNTER — Other Ambulatory Visit: Payer: Self-pay

## 2021-04-22 DIAGNOSIS — S199XXA Unspecified injury of neck, initial encounter: Secondary | ICD-10-CM | POA: Diagnosis present

## 2021-04-22 DIAGNOSIS — M25551 Pain in right hip: Secondary | ICD-10-CM | POA: Diagnosis not present

## 2021-04-22 DIAGNOSIS — Y9289 Other specified places as the place of occurrence of the external cause: Secondary | ICD-10-CM | POA: Insufficient documentation

## 2021-04-22 DIAGNOSIS — Z950 Presence of cardiac pacemaker: Secondary | ICD-10-CM | POA: Insufficient documentation

## 2021-04-22 DIAGNOSIS — I10 Essential (primary) hypertension: Secondary | ICD-10-CM | POA: Insufficient documentation

## 2021-04-22 DIAGNOSIS — M542 Cervicalgia: Secondary | ICD-10-CM | POA: Diagnosis not present

## 2021-04-22 DIAGNOSIS — Z7901 Long term (current) use of anticoagulants: Secondary | ICD-10-CM | POA: Insufficient documentation

## 2021-04-22 DIAGNOSIS — S0990XA Unspecified injury of head, initial encounter: Secondary | ICD-10-CM | POA: Diagnosis not present

## 2021-04-22 DIAGNOSIS — R519 Headache, unspecified: Secondary | ICD-10-CM | POA: Insufficient documentation

## 2021-04-22 DIAGNOSIS — W19XXXA Unspecified fall, initial encounter: Secondary | ICD-10-CM

## 2021-04-22 DIAGNOSIS — I2510 Atherosclerotic heart disease of native coronary artery without angina pectoris: Secondary | ICD-10-CM | POA: Diagnosis not present

## 2021-04-22 DIAGNOSIS — W010XXA Fall on same level from slipping, tripping and stumbling without subsequent striking against object, initial encounter: Secondary | ICD-10-CM | POA: Insufficient documentation

## 2021-04-22 DIAGNOSIS — S161XXA Strain of muscle, fascia and tendon at neck level, initial encounter: Secondary | ICD-10-CM | POA: Diagnosis not present

## 2021-04-22 DIAGNOSIS — Z8546 Personal history of malignant neoplasm of prostate: Secondary | ICD-10-CM | POA: Insufficient documentation

## 2021-04-22 DIAGNOSIS — Z8616 Personal history of COVID-19: Secondary | ICD-10-CM | POA: Diagnosis not present

## 2021-04-22 DIAGNOSIS — Z7982 Long term (current) use of aspirin: Secondary | ICD-10-CM | POA: Insufficient documentation

## 2021-04-22 DIAGNOSIS — Z79899 Other long term (current) drug therapy: Secondary | ICD-10-CM | POA: Diagnosis not present

## 2021-04-22 LAB — COMPREHENSIVE METABOLIC PANEL
ALT: 16 U/L (ref 0–44)
AST: 23 U/L (ref 15–41)
Albumin: 3.5 g/dL (ref 3.5–5.0)
Alkaline Phosphatase: 101 U/L (ref 38–126)
Anion gap: 9 (ref 5–15)
BUN: 28 mg/dL — ABNORMAL HIGH (ref 8–23)
CO2: 24 mmol/L (ref 22–32)
Calcium: 9.1 mg/dL (ref 8.9–10.3)
Chloride: 108 mmol/L (ref 98–111)
Creatinine, Ser: 1.93 mg/dL — ABNORMAL HIGH (ref 0.61–1.24)
GFR, Estimated: 32 mL/min — ABNORMAL LOW (ref >60)
Glucose, Bld: 111 mg/dL — ABNORMAL HIGH (ref 70–99)
Potassium: 4.5 mmol/L (ref 3.5–5.1)
Sodium: 141 mmol/L (ref 135–145)
Total Bilirubin: 1 mg/dL (ref 0.3–1.2)
Total Protein: 6.4 g/dL — ABNORMAL LOW (ref 6.5–8.1)

## 2021-04-22 LAB — CBC WITH DIFFERENTIAL/PLATELET
Abs Immature Granulocytes: 0.03 10*3/uL (ref 0.00–0.07)
Basophils Absolute: 0.1 10*3/uL (ref 0.0–0.1)
Basophils Relative: 1 %
Eosinophils Absolute: 0.2 10*3/uL (ref 0.0–0.5)
Eosinophils Relative: 2 %
HCT: 39.5 % (ref 39.0–52.0)
Hemoglobin: 12.7 g/dL — ABNORMAL LOW (ref 13.0–17.0)
Immature Granulocytes: 0 %
Lymphocytes Relative: 30 %
Lymphs Abs: 2.4 10*3/uL (ref 0.7–4.0)
MCH: 31.5 pg (ref 26.0–34.0)
MCHC: 32.2 g/dL (ref 30.0–36.0)
MCV: 98 fL (ref 80.0–100.0)
Monocytes Absolute: 0.8 10*3/uL (ref 0.1–1.0)
Monocytes Relative: 10 %
Neutro Abs: 4.5 10*3/uL (ref 1.7–7.7)
Neutrophils Relative %: 57 %
Platelets: 178 10*3/uL (ref 150–400)
RBC: 4.03 MIL/uL — ABNORMAL LOW (ref 4.22–5.81)
RDW: 14.7 % (ref 11.5–15.5)
WBC: 8 10*3/uL (ref 4.0–10.5)
nRBC: 0 % (ref 0.0–0.2)

## 2021-04-22 MED ORDER — LIDOCAINE 5 % EX PTCH: 1.0000 | MEDICATED_PATCH | CUTANEOUS | 0 refills | Status: DC

## 2021-04-22 NOTE — ED Triage Notes (Signed)
Pt reports mechanical fall on Wednesday, tripping over his robe when trying to hang it up. Pt denies LOC, just states he felt dizzy afterwards but not prior to fall. On Eliquis. Endorses generalized pain. Ambulatory since fall.

## 2021-04-22 NOTE — ED Provider Notes (Signed)
[facility] EMERGENCY DEPARTMENT Provider Note   CSN: [id number] Arrival date & time: 04/22/21  5784     History Chief Complaint  Patient presents with   Lytle Michaels    Steven Rosario is a 87 y.o. male with a PMHx of CAD, CKD, hypertension, and cervical disc disease presenting to the ED after a mechanical fall that occurred 3 days ago while on Eliquis.  Patient just informed his son today that he had this fall and his son then brought him into the ED.  Patient is unsure if he hit his head.  He states that he was taking off his robe and stepped on it and fell. Denies any dizziness or lightheadedness at the time of the fall. Denies LOC but is ultimately unsure if he had a syncopal episode. Right now he is having right-sided hip, right neck, and head pain. The patient has been taking Tylenol for his pain which has been helping him.  Pain is worse with neck movement. He states that his neck pain started about a week ago after a long car ride to and from [place]. His neck has felt stiff since then and worsened after he fell. Patient denies any fevers, chills, chest pain, shortness of breath, abdominal pain, nausea, vomiting, diarrhea, melena, hematochezia, dysuria, hematuria, or any other symptoms at this time.  He does note that his right leg is chronically weak.  He is able to walk with the assistance of his walker, however he is frustrated by this weakness.  Patient has informed his primary care and orthopedic doctor about this and he is not a candidate for any sort of orthopedic surgery due to his age.  The history is provided by the patient and a relative.  Fall This is a new problem. The current episode started more than 2 days ago. Associated symptoms include headaches. Pertinent negatives include no chest pain, no abdominal pain and no shortness of breath. Nothing aggravates the symptoms. The symptoms are relieved by acetaminophen. He has tried acetaminophen for the symptoms.       Past Medical History:  Diagnosis Date   Acute kidney injury (AKI) with acute tubular necrosis (ATN) (HCC)    Cervical disc disease    Chronic kidney disease    Coronary artery disease    Gout    Hypertension    Nephrolithiasis    Pneumonia due to COVID-19 virus    Presence of stent in LAD coronary artery    Prostate cancer (HCC)    S/P placement of cardiac pacemaker    Urinary incontinence     Patient Active Problem List   Diagnosis Date Noted   Dyspnea on exertion 01/01/2020   Essential hypertension, benign 11/17/2019   Hypercholesteremia 11/17/2019   Pacemaker Saint Jude device implant date 08/23/2017 11/17/2019   Senile arthritis 11/17/2019   Esophageal reflux 11/17/2019   Pernicious anemia 11/17/2019   Gout, unspecified 11/17/2019    Past Surgical History:  Procedure Laterality Date   ANKLE FRACTURE SURGERY Right    cardiac stents x2     caudal epidural injection     CERVICAL DISCECTOMY     COLONOSCOPY     EXPLORATORY LAPAROTOMY     lumbar facet joint injection Bilateral    medical branch block Bilateral    PROSTATECTOMY     RADIOFREQUENCY ABLATION Bilateral    ROTATOR CUFF REPAIR Right    SACROILIAC JOINT ARTHRODESIS Right    SACROILIAC JOINT ARTHRODESIS Bilateral    sacroilliac joint inj  SPIROMETRY         Family History  Problem Relation Age of Onset   Diabetes Mother    Obesity Mother    Heart failure Mother    CAD Father    Bone cancer Father    Heart disease Sister    Obesity Brother    Lung cancer Son     Social History   Tobacco Use   Smoking status: Never   Smokeless tobacco: Never  Substance Use Topics   Alcohol use: Yes    Comment: very rare, once per year    Home Medications Prior to Admission medications   Medication Sig Start Date End Date Taking? Authorizing Provider  acetaminophen (TYLENOL) 650 MG CR tablet Take 650 mg by mouth every 6 (six) hours.   Yes [provider]  allopurinol (ZYLOPRIM) 300 MG  tablet Take 300 mg by mouth daily.   Yes [provider]  amiodarone (PACERONE) 200 MG tablet Take 1 tablet (200 mg total) by mouth daily. 06/14/20  Yes Camnitz, Will Hassell Done, MD  amLODipine (NORVASC) 10 MG tablet Take 10 mg by mouth daily.   Yes [provider]  apixaban (ELIQUIS) 2.5 MG TABS tablet Take 2.5 mg by mouth 2 (two) times daily.   Yes [provider]  Ascorbic Acid (VITA-C PO) Take 500 mg by mouth daily.   Yes [provider]  aspirin 81 MG EC tablet Take 81 mg by mouth daily.   Yes [provider]  atorvastatin (LIPITOR) 20 MG tablet Take 20 mg by mouth daily.   Yes [provider]  bumetanide (BUMEX) 1 MG tablet Take 1 mg by mouth daily.   Yes [provider]  Cholecalciferol (VITAMIN D-3 PO) Take 2,000 Units by mouth 2 (two) times daily.   Yes [provider]  ferrous sulfate 324 MG TBEC Take 324 mg by mouth daily with breakfast.   Yes [provider]  gabapentin (NEURONTIN) 300 MG capsule Take 300 mg by mouth in the morning and at bedtime. 11/14/20  Yes [provider]  GLUCOSAMINE-CHONDROITIN PO Take 1 tablet by mouth in the morning and at bedtime. 5 mg twice a day   Yes [provider]  metoprolol succinate (TOPROL-XL) 50 MG 24 hr tablet Take 50 mg by mouth daily. Take with or immediately following a meal.   Yes [provider]  Multiple Vitamins-Minerals (VITEYES CLASSIC ADVANCED PO) Take 1 tablet by mouth daily.   Yes [provider]  multivitamin (ONE-A-DAY MEN'S) TABS tablet Take 1 tablet by mouth daily.   Yes [provider]  pantoprazole (PROTONIX) 20 MG tablet Take 20 mg by mouth daily.   Yes [provider]  polycarbophil (FIBERCON) 625 MG tablet Take 625 mg by mouth in the morning and at bedtime.   Yes [provider]  venlafaxine XR (EFFEXOR-XR) 150 MG 24 hr capsule Take 150 mg by mouth at bedtime. 11/22/20  Yes [provider]  vitamin B-12 (CYANOCOBALAMIN) 1000 MCG tablet Take 1,000 mcg by mouth daily.   Yes [provider]    Allergies    Patient has no known allergies.  Review of Systems   Review of Systems  Constitutional:  Negative for chills and fever.  HENT:  Negative for nosebleeds, sore throat and tinnitus.   Respiratory:  Negative for cough and shortness of breath.   Cardiovascular:  Positive for leg swelling. Negative for chest pain.  Gastrointestinal:  Negative for abdominal pain, blood in stool,  constipation, diarrhea, nausea and vomiting.  Genitourinary:  Negative for dysuria and hematuria.  Musculoskeletal:  Positive for myalgias and neck pain. Negative for back pain.  Neurological:  Positive for weakness and headaches. Negative for light-headedness.   Physical Exam Updated Vital Signs BP (!) 150/70   Pulse 60   Temp 97.9 F (36.6 C) (Oral)   Resp 15   SpO2 100%   Physical Exam Constitutional:      General: He is not in acute distress.    Appearance: Normal appearance.  HENT:     Head: Normocephalic and atraumatic.  Eyes:     Pupils: Pupils are equal, round, and reactive to light.  Neck:     Comments: Decreased neck rotation to the right and decreased flexion of neck  Cardiovascular:     Rate and Rhythm: Normal rate and regular rhythm.     Pulses: Normal pulses.     Heart sounds: Normal heart sounds. No murmur heard. Pulmonary:     Effort: Pulmonary effort is normal. No respiratory distress.     Breath sounds: Normal breath sounds. No wheezing, rhonchi or rales.  Abdominal:     General: Bowel sounds are normal. There is no distension.     Palpations: Abdomen is soft.     Tenderness: There is no abdominal tenderness.  Musculoskeletal:     Comments: 1+ bilateral lower extremity edema  Neurological:     Mental Status: He is alert and oriented to person, place, and time. Mental status is at baseline.     Cranial Nerves: No cranial nerve deficit.     Sensory: No  sensory deficit.     Motor: Weakness present.     Comments: 5/5 Left lower extremity strength, 3/5 right lower extremity strength (chronic)    ED Results / Procedures / Treatments   Labs (all labs ordered are listed, but only abnormal results are displayed) Labs Reviewed  CBC WITH DIFFERENTIAL/PLATELET - Abnormal; Notable for the following components:      Result Value   RBC 4.03 (*)    Hemoglobin 12.7 (*)    All other components within normal limits  COMPREHENSIVE METABOLIC PANEL - Abnormal; Notable for the following components:   Glucose, Bld 111 (*)    BUN 28 (*)    Creatinine, Ser 1.93 (*)    Total Protein 6.4 (*)    GFR, Estimated 32 (*)    All other components within normal limits    EKG None  Radiology CT Head Wo Contrast  Result Date: 04/22/2021 CLINICAL DATA:  Head trauma, minor. Head and right-sided neck pain since fall 4 days ago. EXAM: CT HEAD WITHOUT CONTRAST CT CERVICAL SPINE WITHOUT CONTRAST TECHNIQUE: Multidetector CT imaging of the head and cervical spine was performed following the standard protocol without intravenous contrast. Multiplanar CT image reconstructions of the cervical spine were also generated. COMPARISON:  None. FINDINGS: CT HEAD FINDINGS Brain: No evidence for acute hemorrhage, mass lesion, midline shift, hydrocephalus or large infarct. Mild low-density in the periventricular white matter is suggestive for chronic changes. Age-appropriate atrophy. Vascular: No hyperdense vessel or unexpected calcification. Skull: Normal. Negative for fracture or focal lesion. Sinuses/Orbits: Mild mucosal thickening in the ethmoid air cells. Otherwise, the visualized paranasal sinuses are clear. Other: None. CT CERVICAL SPINE FINDINGS Alignment: Normal. Skull base and vertebrae: No acute fracture. No primary bone lesion or focal pathologic process. Soft tissues and spinal canal: No prevertebral fluid or swelling. No visible canal hematoma. Disc levels: Disc space narrowing  at  C3-C4. Disc space narrowing at C5-C6 and C6-C7. Foraminal narrowing at C3-C4, particularly on the left side. Right foraminal narrowing at C5-C6. Mild left foraminal narrowing at C6-C7. Upper chest: Partial visualization of the left chest cardiac device. Visualized lungs are clear without a pneumothorax. Other: None IMPRESSION: 1. No acute intracranial abnormality. 2. No acute abnormality in the cervical spine. 3. Multilevel degenerative changes in cervical spine. Electronically Signed   By: Markus Daft M.D.   On: 04/22/2021 09:54   CT Cervical Spine Wo Contrast  Result Date: 04/22/2021 CLINICAL DATA:  Head trauma, minor. Head and right-sided neck pain since fall 4 days ago. EXAM: CT HEAD WITHOUT CONTRAST CT CERVICAL SPINE WITHOUT CONTRAST TECHNIQUE: Multidetector CT imaging of the head and cervical spine was performed following the standard protocol without intravenous contrast. Multiplanar CT image reconstructions of the cervical spine were also generated. COMPARISON:  None. FINDINGS: CT HEAD FINDINGS Brain: No evidence for acute hemorrhage, mass lesion, midline shift, hydrocephalus or large infarct. Mild low-density in the periventricular white matter is suggestive for chronic changes. Age-appropriate atrophy. Vascular: No hyperdense vessel or unexpected calcification. Skull: Normal. Negative for fracture or focal lesion. Sinuses/Orbits: Mild mucosal thickening in the ethmoid air cells. Otherwise, the visualized paranasal sinuses are clear. Other: None. CT CERVICAL SPINE FINDINGS Alignment: Normal. Skull base and vertebrae: No acute fracture. No primary bone lesion or focal pathologic process. Soft tissues and spinal canal: No prevertebral fluid or swelling. No visible canal hematoma. Disc levels: Disc space narrowing at C3-C4. Disc space narrowing at C5-C6 and C6-C7. Foraminal narrowing at C3-C4, particularly on the left side. Right foraminal narrowing at C5-C6. Mild left foraminal narrowing at C6-C7. Upper  chest: Partial visualization of the left chest cardiac device. Visualized lungs are clear without a pneumothorax. Other: None IMPRESSION: 1. No acute intracranial abnormality. 2. No acute abnormality in the cervical spine. 3. Multilevel degenerative changes in cervical spine. Electronically Signed   By: Markus Daft M.D.   On: 04/22/2021 09:54   DG Hip Unilat W or Wo Pelvis 2-3 Views Right  Result Date: 04/22/2021 CLINICAL DATA:  Right hip pain after a fall on Wednesday. EXAM: DG HIP (WITH OR WITHOUT PELVIS) 2-3V RIGHT COMPARISON:  None. FINDINGS: There is no evidence of hip fracture or dislocation. There is no evidence of arthropathy or other focal bone abnormality. Generalized osteopenia. Postoperative pelvis. IMPRESSION: No acute finding. Electronically Signed   By: Monte Fantasia M.D.   On: 04/22/2021 09:26    Procedures Procedures   Medications Ordered in ED Medications - No data to display  ED Course  I have reviewed the triage vital signs and the nursing notes.  Pertinent labs & imaging results that were available during my care of the patient were reviewed by me and considered in my medical decision making (see chart for details).    MDM Rules/Calculators/A&P                           Patient is a 87 yo male presenting 3 days after a mechanical fall in which he is unsure if he hit his head. Patient is on Eliquis and is just complaining of R neck, R hip, and head pain. CT head showed no acute intracranial abnormality, CT neck showing no acute abnormality in the cervical spine except for multilevel degenerative changes in the cervical spine. Patient's neck pain is muscular in nature and is improved with Tylenol use. Patient advised to follow up with  PCP and ortho regarding PT. Patient is stable to be discharged home. Will send home with lidoderm patches.   Final Clinical Impression(s) / ED Diagnoses Final diagnoses:  Strain of neck muscle, initial encounter  Fall, initial encounter     Rx / DC Orders ED Discharge Orders     None        Dorethea Clan, DO 04/22/21 1229    Tegeler, Gwenyth Allegra, MD 04/23/21 [phone]

## 2021-04-22 NOTE — ED Notes (Signed)
Patient verbalizes understanding of discharge instructions. Opportunity for questioning and answers were provided. Pt discharged from ED. 

## 2021-04-22 NOTE — ED Provider Notes (Signed)
Emergency Medicine Provider Triage Evaluation Note  Steven Rosario , a 87 y.o. male  was evaluated in triage.  Pt complains of mechanical fall on Wednesday.  Does not know if he hit his head.  Complaining of pain in his right hip, right side neck, and head.  Pain is worsening.  He is on Eliquis.  Review of Systems  Positive: Ha, neck pain, hips pain Negative: Cp  Physical Exam  BP (!) 148/69   Pulse (!) 59   Temp 97.9 F (36.6 C)   Resp 16   SpO2 100%  Gen:   Awake, no distress   Resp:  Normal effort  MSK:   Tenderness palpation of right hip.  Tenderness palpation of midline and right side C-spine. Other:  No ttp of head and no external signs of head trauma  Medical Decision Making  Medically screening exam initiated at 8:49 AM.  Appropriate orders placed.  Steven Rosario was informed that the remainder of the evaluation will be completed by another provider, this initial triage assessment does not replace that evaluation, and the importance of remaining in the ED until their evaluation is complete.  Ct head/neck, dg hip ordered. C collar ordered 2/2 neck pain   Steven Heidelberg, PA-C 04/22/21 7741    Tegeler, Gwenyth Allegra, MD 04/22/21 [phone]

## 2021-04-25 DIAGNOSIS — M1990 Unspecified osteoarthritis, unspecified site: Secondary | ICD-10-CM | POA: Diagnosis not present

## 2021-04-25 DIAGNOSIS — Z6830 Body mass index (BMI) 30.0-30.9, adult: Secondary | ICD-10-CM | POA: Diagnosis not present

## 2021-04-25 DIAGNOSIS — R269 Unspecified abnormalities of gait and mobility: Secondary | ICD-10-CM | POA: Diagnosis not present

## 2021-04-25 DIAGNOSIS — R600 Localized edema: Secondary | ICD-10-CM | POA: Diagnosis not present

## 2021-04-25 DIAGNOSIS — I10 Essential (primary) hypertension: Secondary | ICD-10-CM | POA: Diagnosis not present

## 2021-05-01 DIAGNOSIS — R2689 Other abnormalities of gait and mobility: Secondary | ICD-10-CM | POA: Diagnosis not present

## 2021-05-01 DIAGNOSIS — M542 Cervicalgia: Secondary | ICD-10-CM | POA: Diagnosis not present

## 2021-05-01 DIAGNOSIS — M62552 Muscle wasting and atrophy, not elsewhere classified, left thigh: Secondary | ICD-10-CM | POA: Diagnosis not present

## 2021-05-01 DIAGNOSIS — M62551 Muscle wasting and atrophy, not elsewhere classified, right thigh: Secondary | ICD-10-CM | POA: Diagnosis not present

## 2021-05-01 DIAGNOSIS — M4003 Postural kyphosis, cervicothoracic region: Secondary | ICD-10-CM | POA: Diagnosis not present

## 2021-05-15 ENCOUNTER — Ambulatory Visit (INDEPENDENT_AMBULATORY_CARE_PROVIDER_SITE_OTHER): Payer: Medicare PPO

## 2021-05-15 DIAGNOSIS — I442 Atrioventricular block, complete: Secondary | ICD-10-CM

## 2021-05-15 LAB — CUP PACEART REMOTE DEVICE CHECK
Battery Remaining Longevity: 68 mo
Battery Remaining Percentage: 62 %
Battery Voltage: 2.98 V
Brady Statistic AP VP Percent: 98 %
Brady Statistic AP VS Percent: 1 %
Brady Statistic AS VP Percent: 1.6 %
Brady Statistic AS VS Percent: 1 %
Brady Statistic RA Percent Paced: 98 %
Brady Statistic RV Percent Paced: 99 %
Date Time Interrogation Session: 20220815020016
Implantable Lead Implant Date: 20181123
Implantable Lead Implant Date: 20181123
Implantable Lead Location: 753859
Implantable Lead Location: 753860
Implantable Pulse Generator Implant Date: 20181123
Lead Channel Impedance Value: 380 Ohm
Lead Channel Impedance Value: 410 Ohm
Lead Channel Pacing Threshold Amplitude: 0.75 V
Lead Channel Pacing Threshold Amplitude: 0.75 V
Lead Channel Pacing Threshold Pulse Width: 0.5 ms
Lead Channel Pacing Threshold Pulse Width: 0.5 ms
Lead Channel Sensing Intrinsic Amplitude: 1.8 mV
Lead Channel Sensing Intrinsic Amplitude: 12 mV
Lead Channel Setting Pacing Amplitude: 1 V
Lead Channel Setting Pacing Amplitude: 1.75 V
Lead Channel Setting Pacing Pulse Width: 0.5 ms
Lead Channel Setting Sensing Sensitivity: 2 mV
Pulse Gen Model: 2272
Pulse Gen Serial Number: 8957038

## 2021-05-29 ENCOUNTER — Other Ambulatory Visit: Payer: Self-pay | Admitting: Cardiology

## 2021-06-02 NOTE — Progress Notes (Signed)
Remote pacemaker transmission.   

## 2021-06-08 DIAGNOSIS — N3941 Urge incontinence: Secondary | ICD-10-CM | POA: Diagnosis not present

## 2021-06-19 DIAGNOSIS — M62552 Muscle wasting and atrophy, not elsewhere classified, left thigh: Secondary | ICD-10-CM | POA: Diagnosis not present

## 2021-06-19 DIAGNOSIS — M4003 Postural kyphosis, cervicothoracic region: Secondary | ICD-10-CM | POA: Diagnosis not present

## 2021-06-19 DIAGNOSIS — M542 Cervicalgia: Secondary | ICD-10-CM | POA: Diagnosis not present

## 2021-06-19 DIAGNOSIS — M62551 Muscle wasting and atrophy, not elsewhere classified, right thigh: Secondary | ICD-10-CM | POA: Diagnosis not present

## 2021-06-19 DIAGNOSIS — R2689 Other abnormalities of gait and mobility: Secondary | ICD-10-CM | POA: Diagnosis not present

## 2021-06-21 DIAGNOSIS — N281 Cyst of kidney, acquired: Secondary | ICD-10-CM | POA: Diagnosis not present

## 2021-06-21 DIAGNOSIS — N2581 Secondary hyperparathyroidism of renal origin: Secondary | ICD-10-CM | POA: Diagnosis not present

## 2021-06-21 DIAGNOSIS — M62551 Muscle wasting and atrophy, not elsewhere classified, right thigh: Secondary | ICD-10-CM | POA: Diagnosis not present

## 2021-06-21 DIAGNOSIS — M542 Cervicalgia: Secondary | ICD-10-CM | POA: Diagnosis not present

## 2021-06-21 DIAGNOSIS — N1832 Chronic kidney disease, stage 3b: Secondary | ICD-10-CM | POA: Diagnosis not present

## 2021-06-21 DIAGNOSIS — R2689 Other abnormalities of gait and mobility: Secondary | ICD-10-CM | POA: Diagnosis not present

## 2021-06-21 DIAGNOSIS — M4003 Postural kyphosis, cervicothoracic region: Secondary | ICD-10-CM | POA: Diagnosis not present

## 2021-06-21 DIAGNOSIS — D631 Anemia in chronic kidney disease: Secondary | ICD-10-CM | POA: Diagnosis not present

## 2021-06-21 DIAGNOSIS — I129 Hypertensive chronic kidney disease with stage 1 through stage 4 chronic kidney disease, or unspecified chronic kidney disease: Secondary | ICD-10-CM | POA: Diagnosis not present

## 2021-06-21 DIAGNOSIS — M62552 Muscle wasting and atrophy, not elsewhere classified, left thigh: Secondary | ICD-10-CM | POA: Diagnosis not present

## 2021-06-23 DIAGNOSIS — M4003 Postural kyphosis, cervicothoracic region: Secondary | ICD-10-CM | POA: Diagnosis not present

## 2021-06-23 DIAGNOSIS — M542 Cervicalgia: Secondary | ICD-10-CM | POA: Diagnosis not present

## 2021-06-23 DIAGNOSIS — M62552 Muscle wasting and atrophy, not elsewhere classified, left thigh: Secondary | ICD-10-CM | POA: Diagnosis not present

## 2021-06-23 DIAGNOSIS — R2689 Other abnormalities of gait and mobility: Secondary | ICD-10-CM | POA: Diagnosis not present

## 2021-06-23 DIAGNOSIS — M62551 Muscle wasting and atrophy, not elsewhere classified, right thigh: Secondary | ICD-10-CM | POA: Diagnosis not present

## 2021-06-26 DIAGNOSIS — M62551 Muscle wasting and atrophy, not elsewhere classified, right thigh: Secondary | ICD-10-CM | POA: Diagnosis not present

## 2021-06-26 DIAGNOSIS — M542 Cervicalgia: Secondary | ICD-10-CM | POA: Diagnosis not present

## 2021-06-26 DIAGNOSIS — M62552 Muscle wasting and atrophy, not elsewhere classified, left thigh: Secondary | ICD-10-CM | POA: Diagnosis not present

## 2021-06-26 DIAGNOSIS — M4003 Postural kyphosis, cervicothoracic region: Secondary | ICD-10-CM | POA: Diagnosis not present

## 2021-06-26 DIAGNOSIS — R2689 Other abnormalities of gait and mobility: Secondary | ICD-10-CM | POA: Diagnosis not present

## 2021-06-28 DIAGNOSIS — M4003 Postural kyphosis, cervicothoracic region: Secondary | ICD-10-CM | POA: Diagnosis not present

## 2021-06-28 DIAGNOSIS — R2689 Other abnormalities of gait and mobility: Secondary | ICD-10-CM | POA: Diagnosis not present

## 2021-06-28 DIAGNOSIS — M62551 Muscle wasting and atrophy, not elsewhere classified, right thigh: Secondary | ICD-10-CM | POA: Diagnosis not present

## 2021-06-28 DIAGNOSIS — M62552 Muscle wasting and atrophy, not elsewhere classified, left thigh: Secondary | ICD-10-CM | POA: Diagnosis not present

## 2021-06-28 DIAGNOSIS — M542 Cervicalgia: Secondary | ICD-10-CM | POA: Diagnosis not present

## 2021-07-03 DIAGNOSIS — M62552 Muscle wasting and atrophy, not elsewhere classified, left thigh: Secondary | ICD-10-CM | POA: Diagnosis not present

## 2021-07-03 DIAGNOSIS — M4003 Postural kyphosis, cervicothoracic region: Secondary | ICD-10-CM | POA: Diagnosis not present

## 2021-07-03 DIAGNOSIS — R2689 Other abnormalities of gait and mobility: Secondary | ICD-10-CM | POA: Diagnosis not present

## 2021-07-03 DIAGNOSIS — M542 Cervicalgia: Secondary | ICD-10-CM | POA: Diagnosis not present

## 2021-07-03 DIAGNOSIS — M62551 Muscle wasting and atrophy, not elsewhere classified, right thigh: Secondary | ICD-10-CM | POA: Diagnosis not present

## 2021-07-05 DIAGNOSIS — M62551 Muscle wasting and atrophy, not elsewhere classified, right thigh: Secondary | ICD-10-CM | POA: Diagnosis not present

## 2021-07-05 DIAGNOSIS — M542 Cervicalgia: Secondary | ICD-10-CM | POA: Diagnosis not present

## 2021-07-05 DIAGNOSIS — M62552 Muscle wasting and atrophy, not elsewhere classified, left thigh: Secondary | ICD-10-CM | POA: Diagnosis not present

## 2021-07-05 DIAGNOSIS — M4003 Postural kyphosis, cervicothoracic region: Secondary | ICD-10-CM | POA: Diagnosis not present

## 2021-07-05 DIAGNOSIS — R2689 Other abnormalities of gait and mobility: Secondary | ICD-10-CM | POA: Diagnosis not present

## 2021-07-07 DIAGNOSIS — M4003 Postural kyphosis, cervicothoracic region: Secondary | ICD-10-CM | POA: Diagnosis not present

## 2021-07-07 DIAGNOSIS — M62551 Muscle wasting and atrophy, not elsewhere classified, right thigh: Secondary | ICD-10-CM | POA: Diagnosis not present

## 2021-07-07 DIAGNOSIS — M62552 Muscle wasting and atrophy, not elsewhere classified, left thigh: Secondary | ICD-10-CM | POA: Diagnosis not present

## 2021-07-07 DIAGNOSIS — M542 Cervicalgia: Secondary | ICD-10-CM | POA: Diagnosis not present

## 2021-07-07 DIAGNOSIS — R2689 Other abnormalities of gait and mobility: Secondary | ICD-10-CM | POA: Diagnosis not present

## 2021-07-10 DIAGNOSIS — R2689 Other abnormalities of gait and mobility: Secondary | ICD-10-CM | POA: Diagnosis not present

## 2021-07-10 DIAGNOSIS — M62551 Muscle wasting and atrophy, not elsewhere classified, right thigh: Secondary | ICD-10-CM | POA: Diagnosis not present

## 2021-07-10 DIAGNOSIS — M62552 Muscle wasting and atrophy, not elsewhere classified, left thigh: Secondary | ICD-10-CM | POA: Diagnosis not present

## 2021-07-10 DIAGNOSIS — M542 Cervicalgia: Secondary | ICD-10-CM | POA: Diagnosis not present

## 2021-07-10 DIAGNOSIS — M4003 Postural kyphosis, cervicothoracic region: Secondary | ICD-10-CM | POA: Diagnosis not present

## 2021-07-12 DIAGNOSIS — M542 Cervicalgia: Secondary | ICD-10-CM | POA: Diagnosis not present

## 2021-07-12 DIAGNOSIS — M62552 Muscle wasting and atrophy, not elsewhere classified, left thigh: Secondary | ICD-10-CM | POA: Diagnosis not present

## 2021-07-12 DIAGNOSIS — M62551 Muscle wasting and atrophy, not elsewhere classified, right thigh: Secondary | ICD-10-CM | POA: Diagnosis not present

## 2021-07-12 DIAGNOSIS — M4003 Postural kyphosis, cervicothoracic region: Secondary | ICD-10-CM | POA: Diagnosis not present

## 2021-07-12 DIAGNOSIS — R2689 Other abnormalities of gait and mobility: Secondary | ICD-10-CM | POA: Diagnosis not present

## 2021-07-14 DIAGNOSIS — R2689 Other abnormalities of gait and mobility: Secondary | ICD-10-CM | POA: Diagnosis not present

## 2021-07-14 DIAGNOSIS — M4003 Postural kyphosis, cervicothoracic region: Secondary | ICD-10-CM | POA: Diagnosis not present

## 2021-07-14 DIAGNOSIS — M62551 Muscle wasting and atrophy, not elsewhere classified, right thigh: Secondary | ICD-10-CM | POA: Diagnosis not present

## 2021-07-14 DIAGNOSIS — M542 Cervicalgia: Secondary | ICD-10-CM | POA: Diagnosis not present

## 2021-07-14 DIAGNOSIS — M62552 Muscle wasting and atrophy, not elsewhere classified, left thigh: Secondary | ICD-10-CM | POA: Diagnosis not present

## 2021-07-16 NOTE — Progress Notes (Signed)
Electrophysiology Office Note   Date:  07/17/2021   ID:  Steven Rosario, DOB September 13, 1929, MRN [id number]  PCP:  Steven Sheriff, MD  Cardiologist:  Steven Rosario Primary Electrophysiologist:  Samariah Hokenson Steven Leeds, MD    Chief Complaint: pacemaker   History of Present Illness: Steven Rosario is a 87 y.o. male who is being seen today for the evaluation of pacemaker at the request of Steven Sheriff, MD. Presenting today for electrophysiology evaluation.  He has a history significant for coronary artery disease status post stents x2 in 2012, paroxysmal atrial fibrillation on Eliquis, Saint Jude dual-chamber pacemaker implanted 08/23/2017 due to sick sinus syndrome and complete heart block, and CKD.  He is relocated from [place] to [place] after his wife of 11 years died.  Today, denies symptoms of palpitations, chest pain, shortness of breath, orthopnea, PND, lower extremity edema, claudication, dizziness, presyncope, syncope, bleeding, or neurologic sequela. The patient is tolerating medications without difficulties.  Since being seen he has done well.  He has had no chest pain or shortness of breath.  His only issue is blood pressure.  His primary physician has been trying to make blood pressure adjustments, but has been having trouble getting it under good control.  Past Medical History:  Diagnosis Date   Acute kidney injury (AKI) with acute tubular necrosis (ATN) (HCC)    Cervical disc disease    Chronic kidney disease    Coronary artery disease    Gout    Hypertension    Nephrolithiasis    Pneumonia due to COVID-19 virus    Presence of stent in LAD coronary artery    Prostate cancer (HCC)    S/P placement of cardiac pacemaker    Urinary incontinence    Past Surgical History:  Procedure Laterality Date   ANKLE FRACTURE SURGERY Right    cardiac stents x2     caudal epidural injection     CERVICAL DISCECTOMY     COLONOSCOPY     EXPLORATORY LAPAROTOMY     lumbar  facet joint injection Bilateral    medical branch block Bilateral    PROSTATECTOMY     RADIOFREQUENCY ABLATION Bilateral    ROTATOR CUFF REPAIR Right    SACROILIAC JOINT ARTHRODESIS Right    SACROILIAC JOINT ARTHRODESIS Bilateral    sacroilliac joint inj     SPIROMETRY       Current Outpatient Medications  Medication Sig Dispense Refill   acetaminophen (TYLENOL) 650 MG CR tablet Take 650 mg by mouth every 6 (six) hours.     allopurinol (ZYLOPRIM) 300 MG tablet Take 300 mg by mouth daily.     amiodarone (PACERONE) 200 MG tablet Take 1 tablet (200 mg total) by mouth daily. Please keep upcoming appt in October 2022 with Dr. Curt Bears before anymore refills. Thank you 90 tablet 0   amLODipine (NORVASC) 10 MG tablet Take 10 mg by mouth daily.     apixaban (ELIQUIS) 2.5 MG TABS tablet Take 2.5 mg by mouth 2 (two) times daily.     Ascorbic Acid (VITA-C PO) Take 500 mg by mouth daily.     aspirin 81 MG EC tablet Take 81 mg by mouth daily.     atorvastatin (LIPITOR) 20 MG tablet Take 20 mg by mouth daily.     bumetanide (BUMEX) 1 MG tablet Take 1 mg by mouth daily.     carvedilol (COREG) 12.5 MG tablet Take 1 tablet (12.5 mg total) by mouth 2 (two) times daily.  60 tablet 11   Cholecalciferol (VITAMIN D-3 PO) Take 2,000 Units by mouth 2 (two) times daily.     ferrous sulfate 324 MG TBEC Take 324 mg by mouth daily with breakfast.     gabapentin (NEURONTIN) 300 MG capsule Take 300 mg by mouth in the morning and at bedtime.     GLUCOSAMINE-CHONDROITIN PO Take 1 tablet by mouth in the morning and at bedtime. 5 mg twice a day     lidocaine (LIDODERM) 5 % Place 1 patch onto the skin daily. Remove & Discard patch within 12 hours or as directed by MD 15 patch 0   multivitamin (ONE-A-DAY MEN'S) TABS tablet Take 1 tablet by mouth daily.     pantoprazole (PROTONIX) 20 MG tablet Take 20 mg by mouth daily.     polycarbophil (FIBERCON) 625 MG tablet Take 625 mg by mouth in the morning and at bedtime.      venlafaxine XR (EFFEXOR-XR) 150 MG 24 hr capsule Take 150 mg by mouth at bedtime.     vitamin B-12 (CYANOCOBALAMIN) 1000 MCG tablet Take 1,000 mcg by mouth daily.     No current facility-administered medications for this visit.    Allergies:   Patient has no known allergies.   Social History:  The patient  reports that he has never smoked. He has never used smokeless tobacco. He reports current alcohol use.   Family History:  The patient's family history includes Bone cancer in his father; CAD in his father; Diabetes in his mother; Heart disease in his sister; Heart failure in his mother; Lung cancer in his son; Obesity in his brother and mother.   ROS:  Please see the history of present illness.   Otherwise, review of systems is positive for none.   All other systems are reviewed and negative.   PHYSICAL EXAM: VS:  BP (!) 164/82   Pulse 60   Ht 5\' 7"  (1.702 m)   Wt 204 lb 6.4 oz (92.7 kg)   SpO2 96%   BMI 32.01 kg/m  , BMI Body mass index is 32.01 kg/m. GEN: Well nourished, well developed, in no acute distress  HEENT: normal  Neck: no JVD, carotid bruits, or masses Cardiac: RRR; no murmurs, rubs, or gallops,no edema  Respiratory:  clear to auscultation bilaterally, normal work of breathing GI: soft, nontender, nondistended, + BS MS: no deformity or atrophy  Skin: warm and dry, device site well healed Neuro:  Strength and sensation are intact Psych: euthymic mood, full affect  EKG:  EKG is ordered today. Personal review of the ekg ordered shows AV paced  Personal review of the device interrogation today. Results in [place]: 04/22/2021: ALT 16; BUN 28; Creatinine, Ser 1.93; Hemoglobin 12.7; Platelets 178; Potassium 4.5; Sodium 141    Lipid Panel  No results found for: CHOL, TRIG, HDL, CHOLHDL, VLDL, LDLCALC, LDLDIRECT   Wt Readings from Last 3 Encounters:  07/17/21 204 lb 6.4 oz (92.7 kg)  05/16/20 196 lb 9.6 oz (89.2 kg)  01/01/20 192 lb (87.1 kg)       Other studies Reviewed: Additional studies/ records that were reviewed today include: TTE 08/06/2019 Review of the above records today demonstrates: Overall normal systolic function with mild LVH Normal right ventricular size and function, mild RVH Aortic atherosclerosis   ASSESSMENT AND PLAN:  1.  Complete heart block: Status post Saint Jude dual-chamber pacemaker implanted in 2018.  Device functioning appropriately.  He is conducting today.  We Brenin Heidelberger switch  him to MVP.  2.  Atrial fibrillation: Currently on amiodarone 200 mg daily, Eliquis 2.5 mg twice daily.  CHA2DS2-VASc of at least 4.  High risk medication monitoring via ECG.  We Laurajean Hosek check labs today for amiodarone monitoring.  Patient remains in sinus rhythm.  No changes at this time.  3.  Coronary artery disease: Status post stents x2 in 2012.  No current chest pain.  4.  Hypertension: Blood pressure is elevated today.  He is on quite a few blood pressure medications including low-dose metoprolol.  We Jaylene Schrom stop his metoprolol and start him on carvedilol 12.5 mg twice daily.  This can be further adjusted by his PCP.   Current medicines are reviewed at length with the patient today.   The patient does not have concerns regarding his medicines.  The following changes were made today: Stop metoprolol start carvedilol  Labs/ tests ordered today include:  Orders Placed This Encounter  Procedures   TSH   Hepatic function panel   TSH   Hepatic function panel   EKG 12-Lead      Disposition:   FU with Joleigh Mineau 1 year  Signed, Zenovia Justman Steven Leeds, MD  07/17/2021 9:49 AM     [facility] [address] [place]  [id number] [phone] (office) [phone] (fax)

## 2021-07-17 ENCOUNTER — Ambulatory Visit (INDEPENDENT_AMBULATORY_CARE_PROVIDER_SITE_OTHER): Payer: Medicare PPO | Admitting: Cardiology

## 2021-07-17 ENCOUNTER — Encounter: Payer: Self-pay | Admitting: Cardiology

## 2021-07-17 ENCOUNTER — Other Ambulatory Visit: Payer: Self-pay

## 2021-07-17 VITALS — BP 164/82 | HR 60 | Ht 67.0 in | Wt 204.4 lb

## 2021-07-17 DIAGNOSIS — I442 Atrioventricular block, complete: Secondary | ICD-10-CM | POA: Diagnosis not present

## 2021-07-17 DIAGNOSIS — Z79899 Other long term (current) drug therapy: Secondary | ICD-10-CM

## 2021-07-17 DIAGNOSIS — I480 Paroxysmal atrial fibrillation: Secondary | ICD-10-CM | POA: Diagnosis not present

## 2021-07-17 MED ORDER — CARVEDILOL 12.5 MG PO TABS: 12.5000 mg | ORAL_TABLET | Freq: Two times a day (BID) | ORAL | 11 refills | Status: DC

## 2021-07-17 NOTE — Patient Instructions (Addendum)
Medication Instructions:  Your physician has recommended you make the following change in your medication:  STOP Toprol START Carvedilol 12.5 mg twice daily  *If you need a refill on your cardiac medications before your next appointment, please call your pharmacy*   Lab Work: Today: TSH & LFTs If you have labs (blood work) drawn today and your tests are completely normal, you will receive your results only by: [place] (if you have MyChart) OR A paper copy in the mail If you have any lab test that is abnormal or we need to change your treatment, we will call you to review the results.   Testing/Procedures: None ordered   Follow-Up: At [facility], you and your health needs are our priority.  As part of our continuing mission to provide you with exceptional heart care, we have created designated Provider Care Teams.  These Care Teams include your primary Cardiologist (physician) and Advanced Practice Providers (APPs -  Physician Assistants and Nurse Practitioners) who all work together to provide you with the care you need, when you need it.  Remote monitoring is used to monitor your Pacemaker or ICD from home. This monitoring reduces the number of office visits required to check your device to one time per year. It allows [place] to keep an eye on the functioning of your device to ensure it is working properly. You are scheduled for a device check from home on 08/14/2021. You may send your transmission at any time that day. If you have a wireless device, the transmission will be sent automatically. After your physician reviews your transmission, you will receive a postcard with your next transmission date.   Please return in 6 months for Amiodarone surveillance lab work.  A recall letter will be mailed to you to remind you of getting this blood work done.   Your next appointment:   1 year(s)  The format for your next appointment:   In Person  Provider:   Allegra Lai,  MD   Thank you for choosing [place]!!   Trinidad Curet, RN [phone]    Other Instructions

## 2021-07-18 LAB — HEPATIC FUNCTION PANEL
ALT: 10 IU/L (ref 0–44)
AST: 20 IU/L (ref 0–40)
Albumin: 4.2 g/dL (ref 3.5–4.6)
Alkaline Phosphatase: 134 IU/L — ABNORMAL HIGH (ref 44–121)
Bilirubin Total: 0.6 mg/dL (ref 0.0–1.2)
Bilirubin, Direct: 0.23 mg/dL (ref 0.00–0.40)
Total Protein: 6.2 g/dL (ref 6.0–8.5)

## 2021-07-18 LAB — TSH: TSH: 1.83 u[IU]/mL (ref 0.450–4.500)

## 2021-08-14 ENCOUNTER — Ambulatory Visit (INDEPENDENT_AMBULATORY_CARE_PROVIDER_SITE_OTHER): Payer: Medicare PPO

## 2021-08-14 DIAGNOSIS — I442 Atrioventricular block, complete: Secondary | ICD-10-CM | POA: Diagnosis not present

## 2021-08-14 LAB — CUP PACEART REMOTE DEVICE CHECK
Battery Remaining Longevity: 66 mo
Battery Remaining Percentage: 59 %
Battery Voltage: 2.98 V
Brady Statistic AP VP Percent: 99 %
Brady Statistic AP VS Percent: 1 %
Brady Statistic AS VP Percent: 1 %
Brady Statistic AS VS Percent: 1 %
Brady Statistic RA Percent Paced: 99 %
Brady Statistic RV Percent Paced: 99 %
Date Time Interrogation Session: 20221114020017
Implantable Lead Implant Date: 20181123
Implantable Lead Implant Date: 20181123
Implantable Lead Location: 753859
Implantable Lead Location: 753860
Implantable Pulse Generator Implant Date: 20181123
Lead Channel Impedance Value: 410 Ohm
Lead Channel Impedance Value: 410 Ohm
Lead Channel Pacing Threshold Amplitude: 0.75 V
Lead Channel Pacing Threshold Amplitude: 0.875 V
Lead Channel Pacing Threshold Pulse Width: 0.5 ms
Lead Channel Pacing Threshold Pulse Width: 0.5 ms
Lead Channel Sensing Intrinsic Amplitude: 12 mV
Lead Channel Sensing Intrinsic Amplitude: 2.8 mV
Lead Channel Setting Pacing Amplitude: 1 V
Lead Channel Setting Pacing Amplitude: 1.875
Lead Channel Setting Pacing Pulse Width: 0.5 ms
Lead Channel Setting Sensing Sensitivity: 2 mV
Pulse Gen Model: 2272
Pulse Gen Serial Number: 8957038

## 2021-08-15 DIAGNOSIS — N3941 Urge incontinence: Secondary | ICD-10-CM | POA: Diagnosis not present

## 2021-08-15 DIAGNOSIS — R350 Frequency of micturition: Secondary | ICD-10-CM | POA: Diagnosis not present

## 2021-08-21 NOTE — Progress Notes (Signed)
Remote pacemaker transmission.   

## 2021-10-19 DIAGNOSIS — Z0000 Encounter for general adult medical examination without abnormal findings: Secondary | ICD-10-CM | POA: Diagnosis not present

## 2021-10-19 DIAGNOSIS — I10 Essential (primary) hypertension: Secondary | ICD-10-CM | POA: Diagnosis not present

## 2021-10-19 DIAGNOSIS — M1990 Unspecified osteoarthritis, unspecified site: Secondary | ICD-10-CM | POA: Diagnosis not present

## 2021-10-19 DIAGNOSIS — M25551 Pain in right hip: Secondary | ICD-10-CM | POA: Diagnosis not present

## 2021-10-19 DIAGNOSIS — M25552 Pain in left hip: Secondary | ICD-10-CM | POA: Diagnosis not present

## 2021-10-19 DIAGNOSIS — Z6830 Body mass index (BMI) 30.0-30.9, adult: Secondary | ICD-10-CM | POA: Diagnosis not present

## 2021-10-25 ENCOUNTER — Other Ambulatory Visit: Payer: Self-pay

## 2021-10-25 MED ORDER — AMIODARONE HCL 200 MG PO TABS: 200.0000 mg | ORAL_TABLET | Freq: Every day | ORAL | 2 refills | Status: DC

## 2021-11-13 ENCOUNTER — Ambulatory Visit (INDEPENDENT_AMBULATORY_CARE_PROVIDER_SITE_OTHER): Payer: Medicare PPO

## 2021-11-13 DIAGNOSIS — I442 Atrioventricular block, complete: Secondary | ICD-10-CM

## 2021-11-13 LAB — CUP PACEART REMOTE DEVICE CHECK
Battery Remaining Longevity: 62 mo
Battery Remaining Percentage: 56 %
Battery Voltage: 2.98 V
Brady Statistic AP VP Percent: 95 %
Brady Statistic AP VS Percent: 1 %
Brady Statistic AS VP Percent: 2.3 %
Brady Statistic AS VS Percent: 2.5 %
Brady Statistic RA Percent Paced: 95 %
Brady Statistic RV Percent Paced: 97 %
Date Time Interrogation Session: 20230213020013
Implantable Lead Implant Date: 20181123
Implantable Lead Implant Date: 20181123
Implantable Lead Location: 753859
Implantable Lead Location: 753860
Implantable Pulse Generator Implant Date: 20181123
Lead Channel Impedance Value: 390 Ohm
Lead Channel Impedance Value: 400 Ohm
Lead Channel Pacing Threshold Amplitude: 0.75 V
Lead Channel Pacing Threshold Amplitude: 0.875 V
Lead Channel Pacing Threshold Pulse Width: 0.5 ms
Lead Channel Pacing Threshold Pulse Width: 0.5 ms
Lead Channel Sensing Intrinsic Amplitude: 12 mV
Lead Channel Sensing Intrinsic Amplitude: 2.1 mV
Lead Channel Setting Pacing Amplitude: 1 V
Lead Channel Setting Pacing Amplitude: 1.875
Lead Channel Setting Pacing Pulse Width: 0.5 ms
Lead Channel Setting Sensing Sensitivity: 2 mV
Pulse Gen Model: 2272
Pulse Gen Serial Number: 8957038

## 2021-11-15 NOTE — Progress Notes (Signed)
Remote pacemaker transmission.   

## 2021-11-27 DIAGNOSIS — Z79899 Other long term (current) drug therapy: Secondary | ICD-10-CM | POA: Diagnosis not present

## 2021-11-27 DIAGNOSIS — E7800 Pure hypercholesterolemia, unspecified: Secondary | ICD-10-CM | POA: Diagnosis not present

## 2021-12-03 IMAGING — CT CT HEAD W/O CM
3 of 4 series · 15 of 47 positions shown, 18 images · non-contrast
Comparison: None.

CLINICAL DATA: Head trauma, minor. Head and right-sided neck pain
since fall 4 days ago.

EXAM:
CT HEAD WITHOUT CONTRAST
CT CERVICAL SPINE WITHOUT CONTRAST
TECHNIQUE: Multidetector CT imaging of the head and cervical spine was
performed following the standard protocol without intravenous
contrast. Multiplanar CT image reconstructions of the cervical spine
were also generated.

[Series 3: head 2.0 h70h · axial · 0.43mm/px · z∈[-139,-11]mm · 9 of 82 slices shown, 12 images]
[im 9/82  brain]
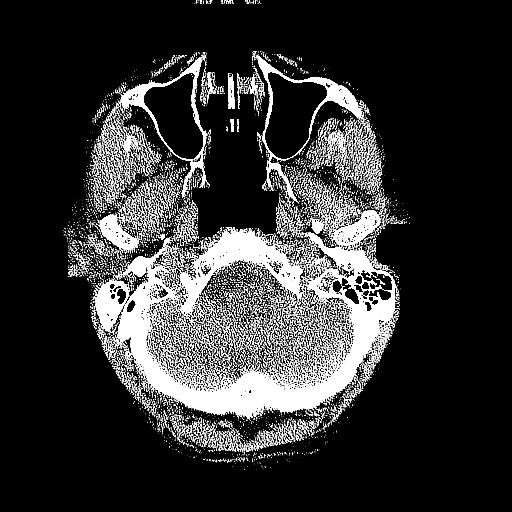
[im 9/82  bone]
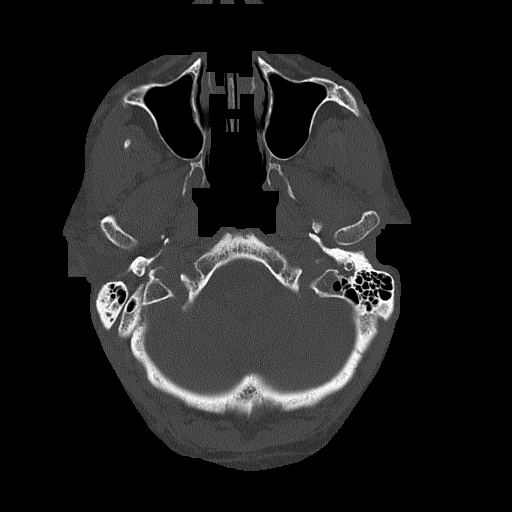
[im 17/82  brain]
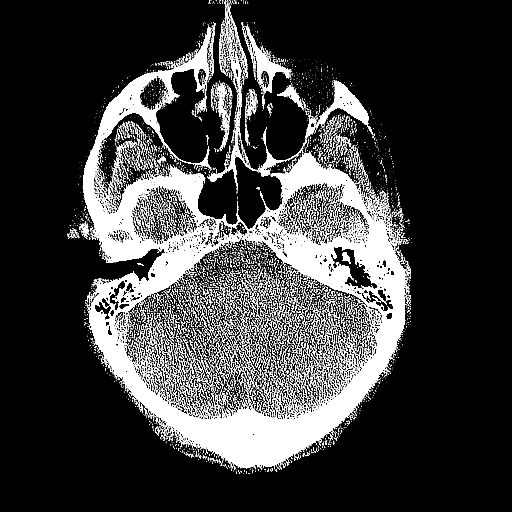
[im 25/82  brain]
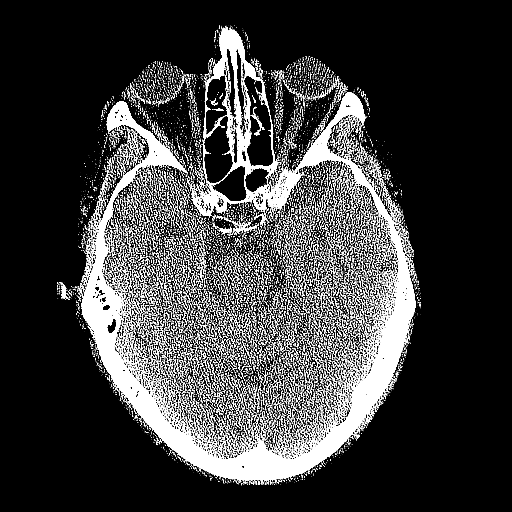
[im 33/82  brain]
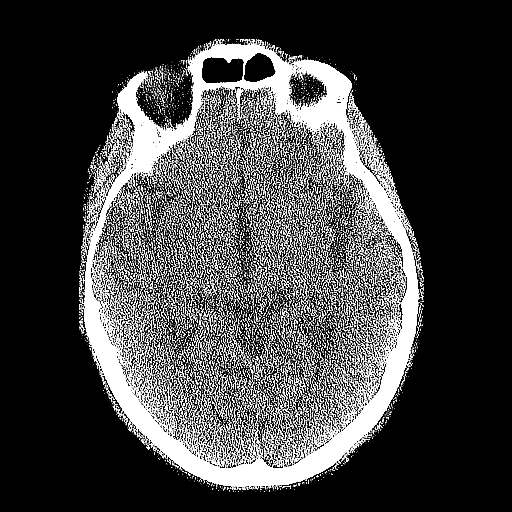
[im 41/82  brain]
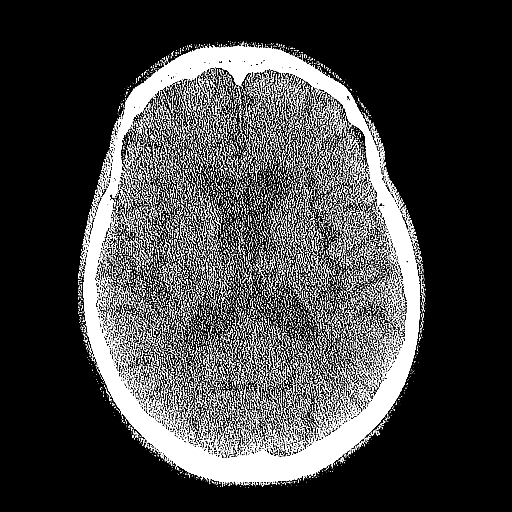
[im 41/82  bone]
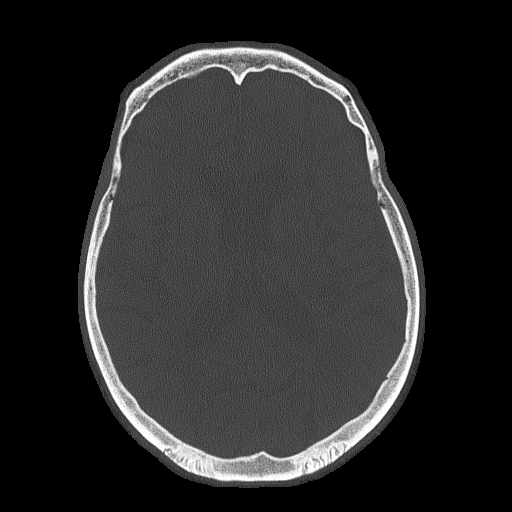
[im 49/82  brain]
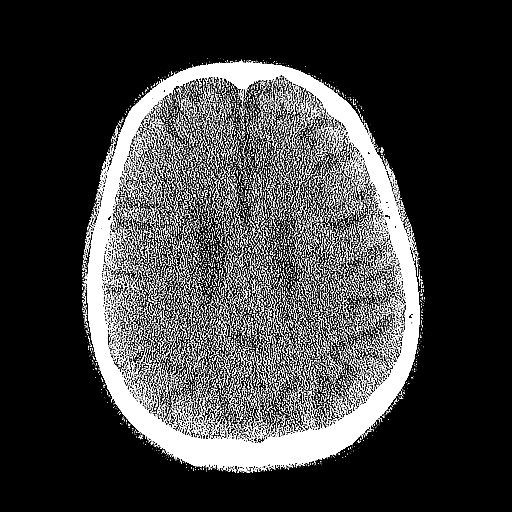
[im 57/82  brain]
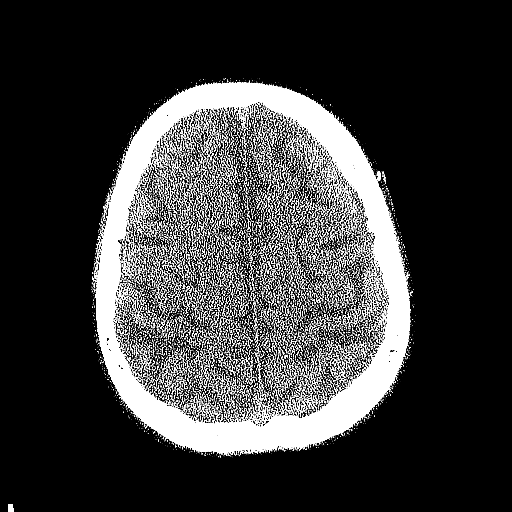
[im 65/82  brain]
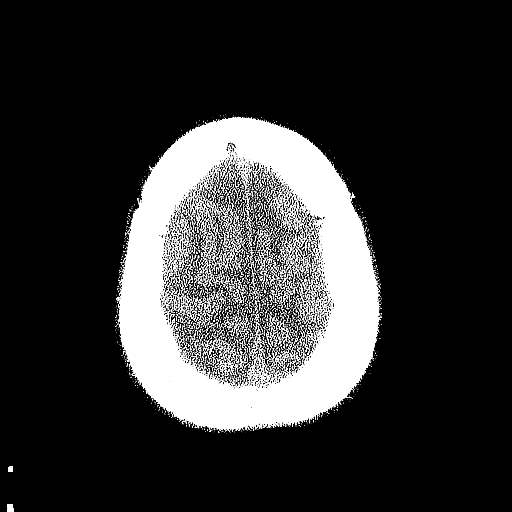
[im 73/82  brain]
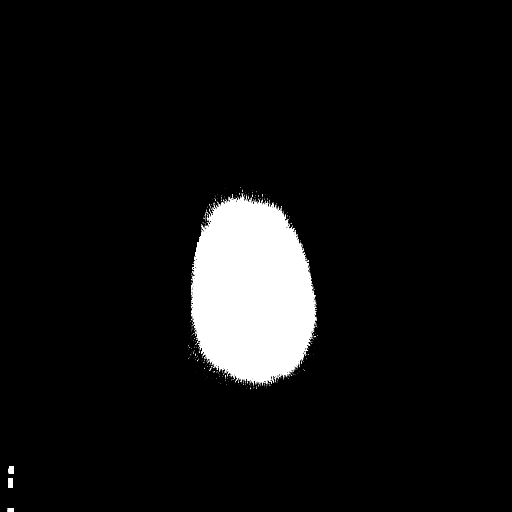
[im 73/82  bone]
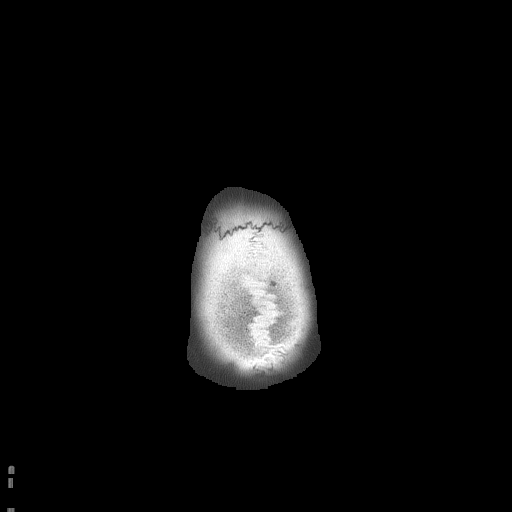

[Series 5: head 3.0 mpr cor · coronal · 0.34mm/px · 3 of 71 slices shown]
[im 24/71  brain]
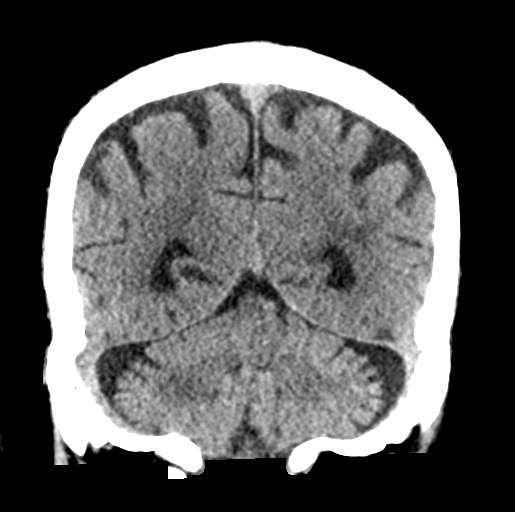
[im 32/71  brain]
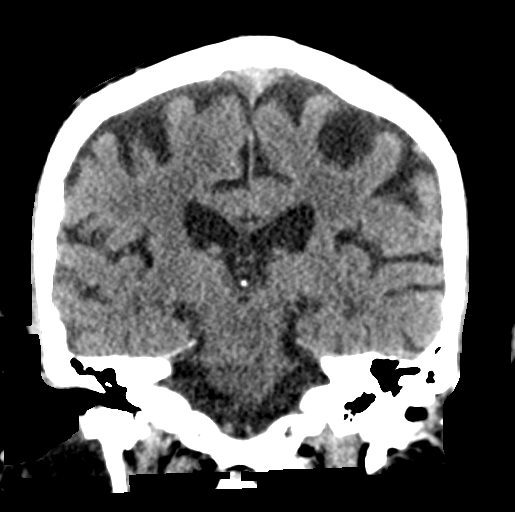
[im 39/71  brain]
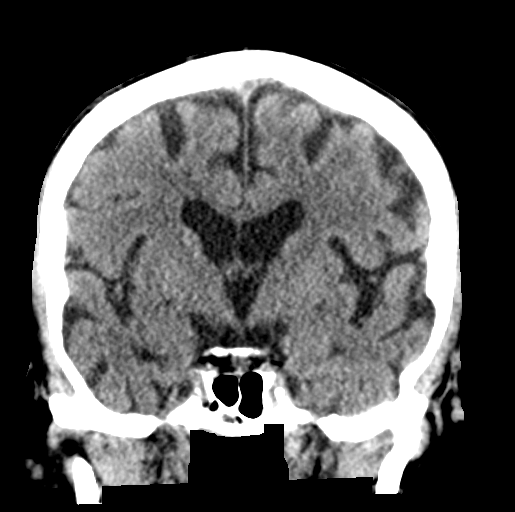

[Series 6: head 3.0 mpr sag · sagittal · 0.32mm/px · 3 of 57 slices shown]
[im 19/57  brain]
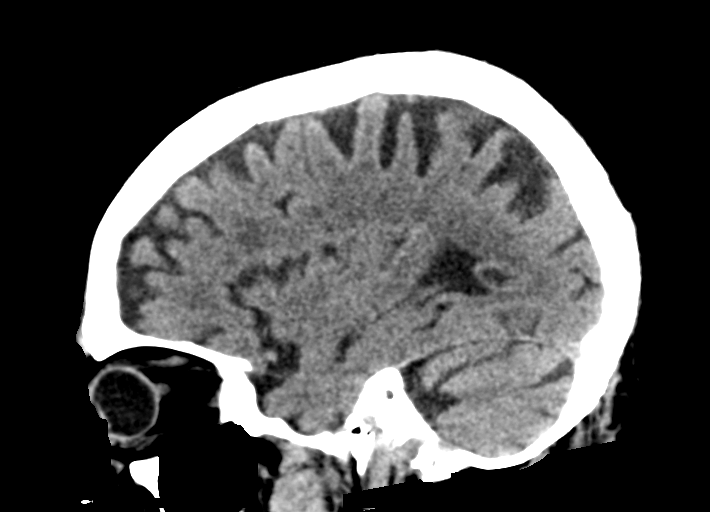
[im 29/57  brain]
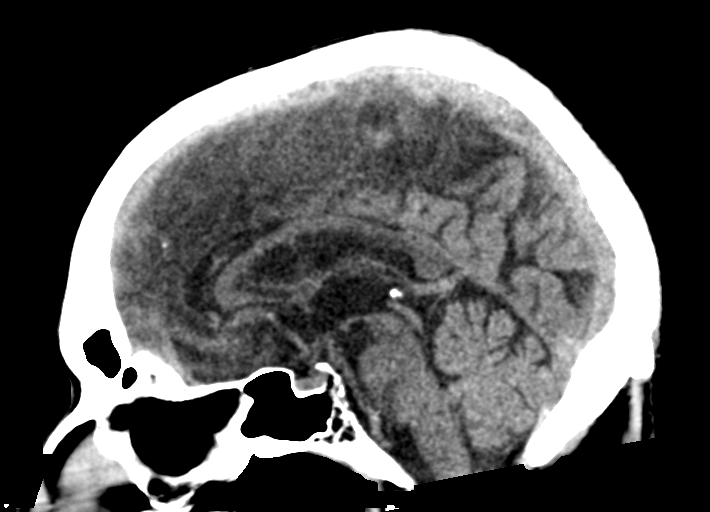
[im 38/57  brain]
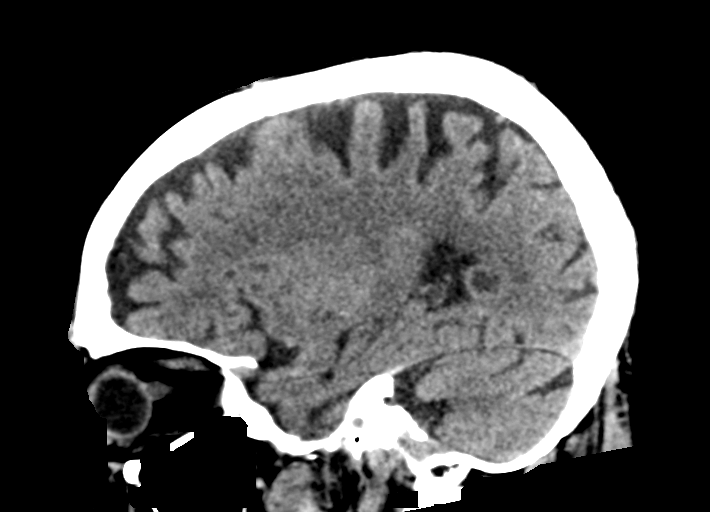

[15 of 47 positions shown; findings below may reference images not displayed]

FINDINGS: CT HEAD FINDINGS

Brain: No evidence for acute hemorrhage, mass lesion, midline shift,
hydrocephalus or large infarct. Mild low-density in the
periventricular white matter is suggestive for chronic changes.
Age-appropriate atrophy.

Vascular: No hyperdense vessel or unexpected calcification.

Skull: Normal. Negative for fracture or focal lesion.

Sinuses/Orbits: Mild mucosal thickening in the ethmoid air cells.
Otherwise, the visualized paranasal sinuses are clear.

Other: None.

CT CERVICAL SPINE FINDINGS

Alignment: Normal.

Skull base and vertebrae: No acute fracture. No primary bone lesion
or focal pathologic process.

Soft tissues and spinal canal: No prevertebral fluid or swelling. No
visible canal hematoma.

Disc levels: Disc space narrowing at C3-C4. Disc space narrowing at
C5-C6 and C6-C7. Foraminal narrowing at C3-C4, particularly on the
left side. Right foraminal narrowing at C5-C6. Mild left foraminal
narrowing at C6-C7.

Upper chest: Partial visualization of the left chest cardiac device.
Visualized lungs are clear without a pneumothorax.

Other: None
IMPRESSION: 1. No acute intracranial abnormality.
2. No acute abnormality in the cervical spine.
3. Multilevel degenerative changes in cervical spine.

## 2021-12-03 IMAGING — CR DG HIP (WITH OR WITHOUT PELVIS) 2-3V*R*
3 series · 3 of 3 positions shown · non-contrast
Comparison: None.

CLINICAL DATA: Right hip pain after a fall on [REDACTED].

EXAM:
DG HIP (WITH OR WITHOUT PELVIS) 2-3V RIGHT

[pelvis ap]
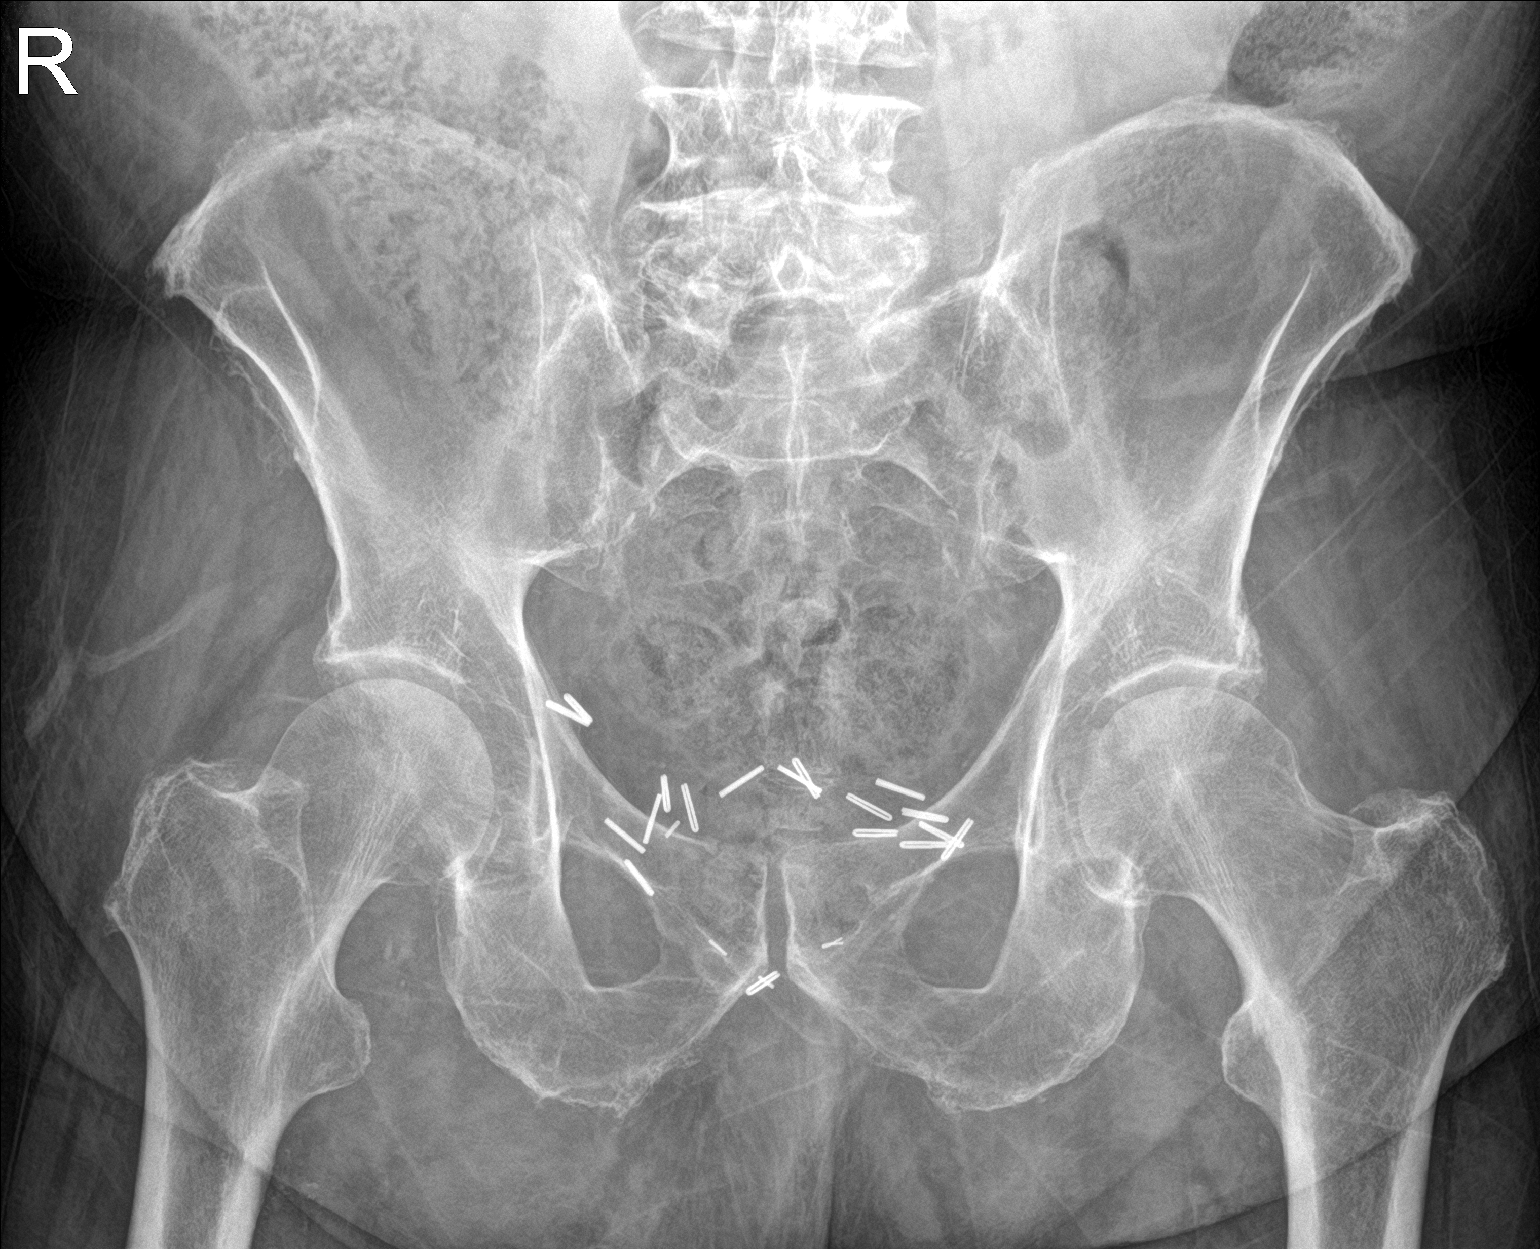

[hip ap]
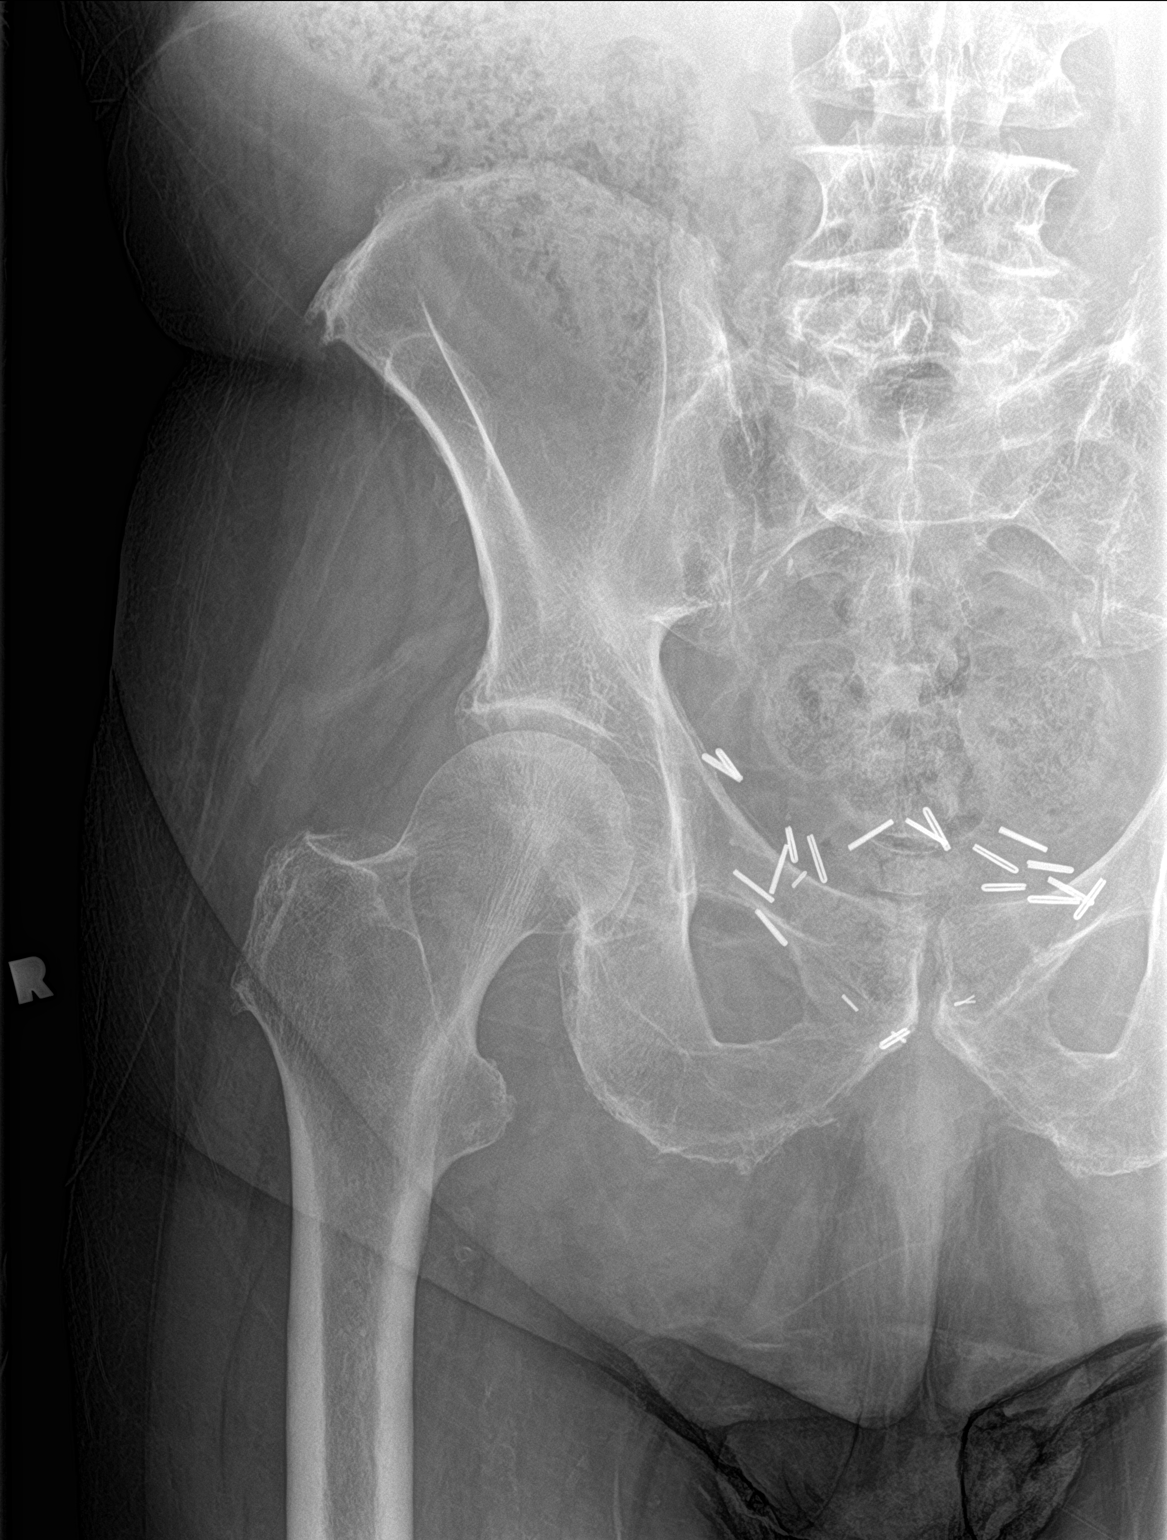

[hip lat]
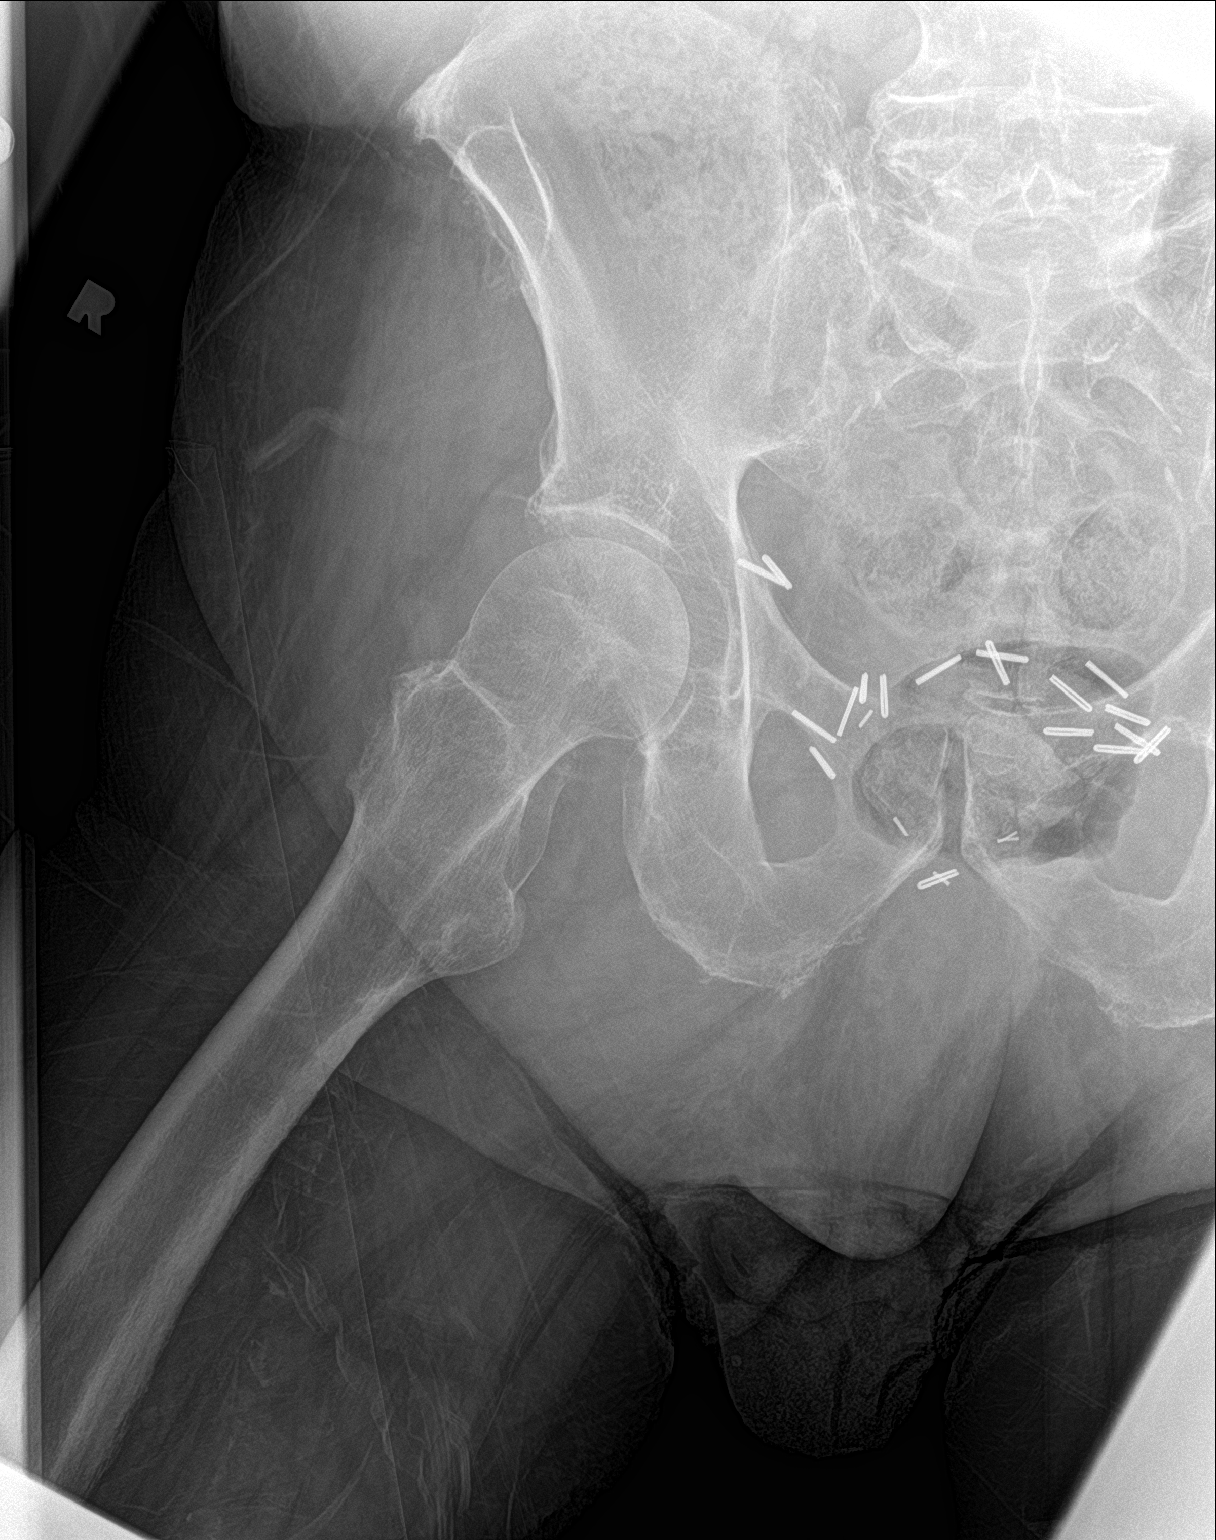

[3 of 3 positions shown; findings below may reference images not displayed]

FINDINGS: There is no evidence of hip fracture or dislocation. There is no
evidence of arthropathy or other focal bone abnormality. Generalized
osteopenia. Postoperative pelvis.
IMPRESSION: No acute finding.

## 2021-12-07 DIAGNOSIS — I129 Hypertensive chronic kidney disease with stage 1 through stage 4 chronic kidney disease, or unspecified chronic kidney disease: Secondary | ICD-10-CM | POA: Diagnosis not present

## 2021-12-07 DIAGNOSIS — D631 Anemia in chronic kidney disease: Secondary | ICD-10-CM | POA: Diagnosis not present

## 2021-12-07 DIAGNOSIS — N1832 Chronic kidney disease, stage 3b: Secondary | ICD-10-CM | POA: Diagnosis not present

## 2021-12-07 DIAGNOSIS — N2581 Secondary hyperparathyroidism of renal origin: Secondary | ICD-10-CM | POA: Diagnosis not present

## 2021-12-07 DIAGNOSIS — N281 Cyst of kidney, acquired: Secondary | ICD-10-CM | POA: Diagnosis not present

## 2021-12-07 DIAGNOSIS — R609 Edema, unspecified: Secondary | ICD-10-CM | POA: Diagnosis not present

## 2021-12-20 DIAGNOSIS — J209 Acute bronchitis, unspecified: Secondary | ICD-10-CM | POA: Diagnosis not present

## 2021-12-20 DIAGNOSIS — Z20828 Contact with and (suspected) exposure to other viral communicable diseases: Secondary | ICD-10-CM | POA: Diagnosis not present

## 2021-12-20 DIAGNOSIS — J019 Acute sinusitis, unspecified: Secondary | ICD-10-CM | POA: Diagnosis not present

## 2021-12-20 DIAGNOSIS — R059 Cough, unspecified: Secondary | ICD-10-CM | POA: Diagnosis not present

## 2022-02-01 DIAGNOSIS — H903 Sensorineural hearing loss, bilateral: Secondary | ICD-10-CM | POA: Diagnosis not present

## 2022-02-01 DIAGNOSIS — H6123 Impacted cerumen, bilateral: Secondary | ICD-10-CM | POA: Diagnosis not present

## 2022-02-12 ENCOUNTER — Ambulatory Visit (INDEPENDENT_AMBULATORY_CARE_PROVIDER_SITE_OTHER): Payer: Medicare HMO

## 2022-02-12 DIAGNOSIS — I442 Atrioventricular block, complete: Secondary | ICD-10-CM

## 2022-02-13 LAB — CUP PACEART REMOTE DEVICE CHECK
Battery Remaining Longevity: 58 mo
Battery Remaining Percentage: 53 %
Battery Voltage: 2.98 V
Brady Statistic AP VP Percent: 97 %
Brady Statistic AP VS Percent: 1 %
Brady Statistic AS VP Percent: 1.6 %
Brady Statistic AS VS Percent: 1.4 %
Brady Statistic RA Percent Paced: 97 %
Brady Statistic RV Percent Paced: 98 %
Date Time Interrogation Session: 20230515051516
Implantable Lead Implant Date: 20181123
Implantable Lead Implant Date: 20181123
Implantable Lead Location: 753859
Implantable Lead Location: 753860
Implantable Pulse Generator Implant Date: 20181123
Lead Channel Impedance Value: 400 Ohm
Lead Channel Impedance Value: 400 Ohm
Lead Channel Pacing Threshold Amplitude: 0.75 V
Lead Channel Pacing Threshold Amplitude: 0.75 V
Lead Channel Pacing Threshold Pulse Width: 0.5 ms
Lead Channel Pacing Threshold Pulse Width: 0.5 ms
Lead Channel Sensing Intrinsic Amplitude: 1.3 mV
Lead Channel Sensing Intrinsic Amplitude: 12 mV
Lead Channel Setting Pacing Amplitude: 1 V
Lead Channel Setting Pacing Amplitude: 1.75 V
Lead Channel Setting Pacing Pulse Width: 0.5 ms
Lead Channel Setting Sensing Sensitivity: 2 mV
Pulse Gen Model: 2272
Pulse Gen Serial Number: 8957038

## 2022-03-01 NOTE — Progress Notes (Signed)
Remote pacemaker transmission.   

## 2022-03-21 DIAGNOSIS — N1832 Chronic kidney disease, stage 3b: Secondary | ICD-10-CM | POA: Diagnosis not present

## 2022-03-26 DIAGNOSIS — Z961 Presence of intraocular lens: Secondary | ICD-10-CM | POA: Diagnosis not present

## 2022-03-26 DIAGNOSIS — H353132 Nonexudative age-related macular degeneration, bilateral, intermediate dry stage: Secondary | ICD-10-CM | POA: Diagnosis not present

## 2022-03-26 DIAGNOSIS — H524 Presbyopia: Secondary | ICD-10-CM | POA: Diagnosis not present

## 2022-03-26 DIAGNOSIS — H52203 Unspecified astigmatism, bilateral: Secondary | ICD-10-CM | POA: Diagnosis not present

## 2022-03-29 DIAGNOSIS — N1832 Chronic kidney disease, stage 3b: Secondary | ICD-10-CM | POA: Diagnosis not present

## 2022-03-29 DIAGNOSIS — R609 Edema, unspecified: Secondary | ICD-10-CM | POA: Diagnosis not present

## 2022-03-29 DIAGNOSIS — N2581 Secondary hyperparathyroidism of renal origin: Secondary | ICD-10-CM | POA: Diagnosis not present

## 2022-03-29 DIAGNOSIS — N281 Cyst of kidney, acquired: Secondary | ICD-10-CM | POA: Diagnosis not present

## 2022-03-29 DIAGNOSIS — I129 Hypertensive chronic kidney disease with stage 1 through stage 4 chronic kidney disease, or unspecified chronic kidney disease: Secondary | ICD-10-CM | POA: Diagnosis not present

## 2022-03-29 DIAGNOSIS — D631 Anemia in chronic kidney disease: Secondary | ICD-10-CM | POA: Diagnosis not present

## 2022-03-29 DIAGNOSIS — N179 Acute kidney failure, unspecified: Secondary | ICD-10-CM | POA: Diagnosis not present

## 2022-04-03 ENCOUNTER — Encounter (HOSPITAL_COMMUNITY): Payer: Self-pay | Admitting: Emergency Medicine

## 2022-04-03 ENCOUNTER — Emergency Department (HOSPITAL_COMMUNITY): Payer: Medicare HMO

## 2022-04-03 ENCOUNTER — Other Ambulatory Visit: Payer: Self-pay

## 2022-04-03 ENCOUNTER — Inpatient Hospital Stay (HOSPITAL_COMMUNITY)
Admission: EM | Admit: 2022-04-03 | Discharge: 2022-04-06 | DRG: 291 | Disposition: A | Discharge status: Home / Self Care | Payer: Medicare HMO | Attending: Internal Medicine | Admitting: Internal Medicine

## 2022-04-03 DIAGNOSIS — R0602 Shortness of breath: Secondary | ICD-10-CM | POA: Diagnosis not present

## 2022-04-03 DIAGNOSIS — J810 Acute pulmonary edema: Secondary | ICD-10-CM | POA: Diagnosis present

## 2022-04-03 DIAGNOSIS — I509 Heart failure, unspecified: Secondary | ICD-10-CM

## 2022-04-03 DIAGNOSIS — R778 Other specified abnormalities of plasma proteins: Secondary | ICD-10-CM

## 2022-04-03 DIAGNOSIS — Z66 Do not resuscitate: Secondary | ICD-10-CM | POA: Diagnosis not present

## 2022-04-03 DIAGNOSIS — I480 Paroxysmal atrial fibrillation: Secondary | ICD-10-CM | POA: Diagnosis present

## 2022-04-03 DIAGNOSIS — E785 Hyperlipidemia, unspecified: Secondary | ICD-10-CM | POA: Diagnosis present

## 2022-04-03 DIAGNOSIS — I10 Essential (primary) hypertension: Secondary | ICD-10-CM | POA: Diagnosis not present

## 2022-04-03 DIAGNOSIS — N189 Chronic kidney disease, unspecified: Secondary | ICD-10-CM | POA: Diagnosis not present

## 2022-04-03 DIAGNOSIS — Z79899 Other long term (current) drug therapy: Secondary | ICD-10-CM

## 2022-04-03 DIAGNOSIS — J9601 Acute respiratory failure with hypoxia: Secondary | ICD-10-CM | POA: Diagnosis present

## 2022-04-03 DIAGNOSIS — I495 Sick sinus syndrome: Secondary | ICD-10-CM | POA: Diagnosis present

## 2022-04-03 DIAGNOSIS — Z801 Family history of malignant neoplasm of trachea, bronchus and lung: Secondary | ICD-10-CM | POA: Diagnosis not present

## 2022-04-03 DIAGNOSIS — I130 Hypertensive heart and chronic kidney disease with heart failure and stage 1 through stage 4 chronic kidney disease, or unspecified chronic kidney disease: Principal | ICD-10-CM | POA: Diagnosis present

## 2022-04-03 DIAGNOSIS — I2510 Atherosclerotic heart disease of native coronary artery without angina pectoris: Secondary | ICD-10-CM | POA: Diagnosis present

## 2022-04-03 DIAGNOSIS — N179 Acute kidney failure, unspecified: Secondary | ICD-10-CM | POA: Diagnosis not present

## 2022-04-03 DIAGNOSIS — D631 Anemia in chronic kidney disease: Secondary | ICD-10-CM | POA: Diagnosis present

## 2022-04-03 DIAGNOSIS — Z955 Presence of coronary angioplasty implant and graft: Secondary | ICD-10-CM | POA: Diagnosis not present

## 2022-04-03 DIAGNOSIS — Z8249 Family history of ischemic heart disease and other diseases of the circulatory system: Secondary | ICD-10-CM | POA: Diagnosis not present

## 2022-04-03 DIAGNOSIS — N184 Chronic kidney disease, stage 4 (severe): Secondary | ICD-10-CM | POA: Diagnosis present

## 2022-04-03 DIAGNOSIS — M109 Gout, unspecified: Secondary | ICD-10-CM | POA: Diagnosis present

## 2022-04-03 DIAGNOSIS — J90 Pleural effusion, not elsewhere classified: Secondary | ICD-10-CM | POA: Diagnosis not present

## 2022-04-03 DIAGNOSIS — R062 Wheezing: Secondary | ICD-10-CM | POA: Diagnosis not present

## 2022-04-03 DIAGNOSIS — T502X5A Adverse effect of carbonic-anhydrase inhibitors, benzothiadiazides and other diuretics, initial encounter: Secondary | ICD-10-CM | POA: Diagnosis present

## 2022-04-03 DIAGNOSIS — Z7901 Long term (current) use of anticoagulants: Secondary | ICD-10-CM | POA: Diagnosis not present

## 2022-04-03 DIAGNOSIS — Z8701 Personal history of pneumonia (recurrent): Secondary | ICD-10-CM | POA: Diagnosis not present

## 2022-04-03 DIAGNOSIS — Z7982 Long term (current) use of aspirin: Secondary | ICD-10-CM | POA: Diagnosis not present

## 2022-04-03 DIAGNOSIS — J9811 Atelectasis: Secondary | ICD-10-CM | POA: Diagnosis not present

## 2022-04-03 DIAGNOSIS — Z8616 Personal history of COVID-19: Secondary | ICD-10-CM | POA: Diagnosis not present

## 2022-04-03 DIAGNOSIS — I5041 Acute combined systolic (congestive) and diastolic (congestive) heart failure: Secondary | ICD-10-CM | POA: Diagnosis not present

## 2022-04-03 DIAGNOSIS — I5033 Acute on chronic diastolic (congestive) heart failure: Secondary | ICD-10-CM | POA: Diagnosis not present

## 2022-04-03 DIAGNOSIS — R0902 Hypoxemia: Secondary | ICD-10-CM | POA: Diagnosis not present

## 2022-04-03 DIAGNOSIS — Z8546 Personal history of malignant neoplasm of prostate: Secondary | ICD-10-CM | POA: Diagnosis not present

## 2022-04-03 DIAGNOSIS — I5043 Acute on chronic combined systolic (congestive) and diastolic (congestive) heart failure: Secondary | ICD-10-CM | POA: Diagnosis not present

## 2022-04-03 DIAGNOSIS — Z833 Family history of diabetes mellitus: Secondary | ICD-10-CM

## 2022-04-03 DIAGNOSIS — Z950 Presence of cardiac pacemaker: Secondary | ICD-10-CM

## 2022-04-03 LAB — BASIC METABOLIC PANEL
Anion gap: 12 (ref 5–15)
BUN: 36 mg/dL — ABNORMAL HIGH (ref 8–23)
CO2: 21 mmol/L — ABNORMAL LOW (ref 22–32)
Calcium: 8.8 mg/dL — ABNORMAL LOW (ref 8.9–10.3)
Chloride: 110 mmol/L (ref 98–111)
Creatinine, Ser: 2.52 mg/dL — ABNORMAL HIGH (ref 0.61–1.24)
GFR, Estimated: 24 mL/min — ABNORMAL LOW (ref >60)
Glucose, Bld: 169 mg/dL — ABNORMAL HIGH (ref 70–99)
Potassium: 4.6 mmol/L (ref 3.5–5.1)
Sodium: 143 mmol/L (ref 135–145)

## 2022-04-03 LAB — CBC WITH DIFFERENTIAL/PLATELET
Abs Immature Granulocytes: 0.04 10*3/uL (ref 0.00–0.07)
Basophils Absolute: 0.1 10*3/uL (ref 0.0–0.1)
Basophils Relative: 1 %
Eosinophils Absolute: 0.2 10*3/uL (ref 0.0–0.5)
Eosinophils Relative: 2 %
HCT: 36.9 % — ABNORMAL LOW (ref 39.0–52.0)
Hemoglobin: 11.8 g/dL — ABNORMAL LOW (ref 13.0–17.0)
Immature Granulocytes: 1 %
Lymphocytes Relative: 15 %
Lymphs Abs: 1.3 10*3/uL (ref 0.7–4.0)
MCH: 31.4 pg (ref 26.0–34.0)
MCHC: 32 g/dL (ref 30.0–36.0)
MCV: 98.1 fL (ref 80.0–100.0)
Monocytes Absolute: 0.6 10*3/uL (ref 0.1–1.0)
Monocytes Relative: 7 %
Neutro Abs: 6.6 10*3/uL (ref 1.7–7.7)
Neutrophils Relative %: 74 %
Platelets: 194 10*3/uL (ref 150–400)
RBC: 3.76 MIL/uL — ABNORMAL LOW (ref 4.22–5.81)
RDW: 16 % — ABNORMAL HIGH (ref 11.5–15.5)
WBC: 8.8 10*3/uL (ref 4.0–10.5)
nRBC: 0 % (ref 0.0–0.2)

## 2022-04-03 LAB — BRAIN NATRIURETIC PEPTIDE: B Natriuretic Peptide: 1524.2 pg/mL — ABNORMAL HIGH (ref 0.0–100.0)

## 2022-04-03 LAB — TROPONIN I (HIGH SENSITIVITY)
Troponin I (High Sensitivity): 46 ng/L — ABNORMAL HIGH (ref <18)
Troponin I (High Sensitivity): 47 ng/L — ABNORMAL HIGH (ref <18)

## 2022-04-03 LAB — SARS CORONAVIRUS 2 BY RT PCR: SARS Coronavirus 2 by RT PCR: NEGATIVE

## 2022-04-03 MED ORDER — FUROSEMIDE 10 MG/ML IJ SOLN
60.0000 mg | Freq: Two times a day (BID) | INTRAMUSCULAR | Status: DC
  Administered 2022-04-03 – 2022-04-05 (×4): 60 mg via INTRAVENOUS
  Filled 2022-04-03 (×4): qty 6

## 2022-04-03 MED ORDER — AMLODIPINE BESYLATE 5 MG PO TABS
5.0000 mg | ORAL_TABLET | Freq: Every day | ORAL | Status: DC
  Administered 2022-04-03 – 2022-04-06 (×4): 5 mg via ORAL
  Filled 2022-04-03 (×4): qty 1

## 2022-04-03 MED ORDER — FUROSEMIDE 10 MG/ML IJ SOLN
80.0000 mg | Freq: Once | INTRAMUSCULAR | Status: AC
  Administered 2022-04-03: 80 mg via INTRAVENOUS
  Filled 2022-04-03: qty 8

## 2022-04-03 MED ORDER — SODIUM CHLORIDE 0.9% FLUSH
3.0000 mL | Freq: Two times a day (BID) | INTRAVENOUS | Status: DC
  Administered 2022-04-03 – 2022-04-06 (×7): 3 mL via INTRAVENOUS

## 2022-04-03 MED ORDER — HYDRALAZINE HCL 50 MG PO TABS
50.0000 mg | ORAL_TABLET | Freq: Two times a day (BID) | ORAL | Status: DC
  Administered 2022-04-03 – 2022-04-05 (×5): 50 mg via ORAL
  Filled 2022-04-03 (×5): qty 1

## 2022-04-03 MED ORDER — PANTOPRAZOLE SODIUM 20 MG PO TBEC
20.0000 mg | DELAYED_RELEASE_TABLET | Freq: Every day | ORAL | Status: DC
  Administered 2022-04-03 – 2022-04-06 (×4): 20 mg via ORAL
  Filled 2022-04-03 (×4): qty 1

## 2022-04-03 MED ORDER — ATORVASTATIN CALCIUM 10 MG PO TABS
20.0000 mg | ORAL_TABLET | Freq: Every day | ORAL | Status: DC
  Administered 2022-04-03 – 2022-04-06 (×4): 20 mg via ORAL
  Filled 2022-04-03 (×4): qty 2

## 2022-04-03 MED ORDER — SENNA 8.6 MG PO TABS
1.0000 | ORAL_TABLET | Freq: Every day | ORAL | Status: DC | PRN
  Administered 2022-04-05 – 2022-04-06 (×2): 8.6 mg via ORAL
  Filled 2022-04-03 (×2): qty 1

## 2022-04-03 MED ORDER — APIXABAN 2.5 MG PO TABS
2.5000 mg | ORAL_TABLET | Freq: Two times a day (BID) | ORAL | Status: DC
  Administered 2022-04-03 – 2022-04-06 (×7): 2.5 mg via ORAL
  Filled 2022-04-03 (×10): qty 1

## 2022-04-03 MED ORDER — ACETAMINOPHEN 650 MG RE SUPP: 650.0000 mg | Freq: Four times a day (QID) | RECTAL | Status: DC | PRN

## 2022-04-03 MED ORDER — AMIODARONE HCL 200 MG PO TABS
200.0000 mg | ORAL_TABLET | Freq: Every day | ORAL | Status: DC
  Administered 2022-04-03 – 2022-04-06 (×4): 200 mg via ORAL
  Filled 2022-04-03 (×4): qty 1

## 2022-04-03 MED ORDER — ACETAMINOPHEN 325 MG PO TABS: 650.0000 mg | ORAL_TABLET | Freq: Four times a day (QID) | ORAL | Status: DC | PRN

## 2022-04-03 MED ORDER — ALUM & MAG HYDROXIDE-SIMETH 200-200-20 MG/5ML PO SUSP
30.0000 mL | ORAL | Status: DC | PRN
  Administered 2022-04-03: 30 mL via ORAL

## 2022-04-03 NOTE — H&P (Signed)
History and Physical    Steven Rosario [id number]:[id number] DOB: 1929/04/11 DOA: 04/03/2022  PCP: Angelina Sheriff, MD   Patient coming from: Home   Chief Complaint: SOB   HPI: Steven Rosario is a pleasant 88 y.o. male with medical history significant for sick sinus syndrome with pacer, paroxysmal atrial fibrillation on Eliquis, CAD, hypertension, and CKD stage IV, now presenting to the emergency department with worsening shortness of breath.  Patient reports 3 days of progressive shortness of breath.  He lives alone, drives, and was able to mow his grass and trim shrubs yesterday, but was having some difficulty due to dyspnea.  Shortness of breath continued to worsen, particularly when he was trying to sleep last night, prompting him to call his son who called EMS.  He denies any chest pain.  His cough has been mainly nonproductive.  Has not noticed much change in his chronic bilateral lower extremity swelling.  States that he recently had his diuretic reduced, and was also started on a new medication but does not recall the name.  He was saturating 86% on room air with EMS, started on supplemental oxygen and treated with albuterol, and brought into the ED.  ED Course: Upon arrival to the ED, patient is found to be afebrile and saturating well on 3 L/min of supplemental oxygen.  EKG features a paced rhythm.  Chest x-ray notable for interstitial edema and bilateral pleural effusions.  Chemistry panel with creatinine 2.52.  Troponin was 46 and then 47.  BNP elevated 1524.  Patient was treated with 80 mg IV Lasix in the ED and put out 600 mL within the first hour.  Review of Systems:  All other systems reviewed and apart from HPI, are negative.  Past Medical History:  Diagnosis Date   Acute kidney injury (AKI) with acute tubular necrosis (ATN) (HCC)    Cervical disc disease    Chronic kidney disease    Coronary artery disease    Gout    Hypertension    Nephrolithiasis    Pneumonia due to COVID-19  virus    Presence of stent in LAD coronary artery    Prostate cancer (HCC)    S/P placement of cardiac pacemaker    Urinary incontinence     Past Surgical History:  Procedure Laterality Date   ANKLE FRACTURE SURGERY Right    cardiac stents x2     caudal epidural injection     CERVICAL DISCECTOMY     COLONOSCOPY     EXPLORATORY LAPAROTOMY     lumbar facet joint injection Bilateral    medical branch block Bilateral    PROSTATECTOMY     RADIOFREQUENCY ABLATION Bilateral    ROTATOR CUFF REPAIR Right    SACROILIAC JOINT ARTHRODESIS Right    SACROILIAC JOINT ARTHRODESIS Bilateral    sacroilliac joint inj     SPIROMETRY      Social History:   reports that he has never smoked. He has never used smokeless tobacco. He reports current alcohol use. No history on file for drug use.  No Known Allergies  Family History  Problem Relation Age of Onset   Diabetes Mother    Obesity Mother    Heart failure Mother    CAD Father    Bone cancer Father    Heart disease Sister    Obesity Brother    Lung cancer Son      Prior to Admission medications   Medication Sig Start Date End Date Taking? Authorizing Provider  acetaminophen (TYLENOL) 650 MG CR tablet Take 650 mg by mouth every 6 (six) hours.    [provider]  allopurinol (ZYLOPRIM) 300 MG tablet Take 300 mg by mouth daily.    [provider]  amiodarone (PACERONE) 200 MG tablet Take 1 tablet (200 mg total) by mouth daily. 10/25/21   Camnitz, Will Hassell Done, MD  amLODipine (NORVASC) 10 MG tablet Take 10 mg by mouth daily.    [provider]  apixaban (ELIQUIS) 2.5 MG TABS tablet Take 2.5 mg by mouth 2 (two) times daily.    [provider]  Ascorbic Acid (VITA-C PO) Take 500 mg by mouth daily.    [provider]  aspirin 81 MG EC tablet Take 81 mg by mouth daily.    [provider]  atorvastatin (LIPITOR) 20 MG tablet Take 20 mg by mouth daily.    [provider]  bumetanide  (BUMEX) 1 MG tablet Take 1 mg by mouth daily.    [provider]  carvedilol (COREG) 12.5 MG tablet Take 1 tablet (12.5 mg total) by mouth 2 (two) times daily. 07/17/21   Camnitz, Ocie Doyne, MD  Cholecalciferol (VITAMIN D-3 PO) Take 2,000 Units by mouth 2 (two) times daily.    [provider]  ferrous sulfate 324 MG TBEC Take 324 mg by mouth daily with breakfast.    [provider]  gabapentin (NEURONTIN) 300 MG capsule Take 300 mg by mouth in the morning and at bedtime. 11/14/20   [provider]  GLUCOSAMINE-CHONDROITIN PO Take 1 tablet by mouth in the morning and at bedtime. 5 mg twice a day    [provider]  lidocaine (LIDODERM) 5 % Place 1 patch onto the skin daily. Remove & Discard patch within 12 hours or as directed by MD 04/22/21   Dorethea Clan, DO  multivitamin (ONE-A-DAY MEN'S) TABS tablet Take 1 tablet by mouth daily.    [provider]  pantoprazole (PROTONIX) 20 MG tablet Take 20 mg by mouth daily.    [provider]  polycarbophil (FIBERCON) 625 MG tablet Take 625 mg by mouth in the morning and at bedtime.    [provider]  venlafaxine XR (EFFEXOR-XR) 150 MG 24 hr capsule Take 150 mg by mouth at bedtime. 11/22/20   [provider]  vitamin B-12 (CYANOCOBALAMIN) 1000 MCG tablet Take 1,000 mcg by mouth daily.    [provider]    Physical Exam: Vitals:   04/03/22 0430 04/03/22 0500 04/03/22 0515 04/03/22 0600  BP: (!) 159/76 (!) 169/81 (!) 176/77 (!) 163/73  Pulse: 63 (!) 59 60 64  Resp: (!) 22 (!) '21 19 18  '$ Temp:      TempSrc:      SpO2: 98% 98% 97% 98%    Constitutional: NAD, no pallor or diaphoresis   Eyes: PERTLA, lids and conjunctivae normal ENMT: Mucous membranes are moist. Posterior pharynx clear of any exudate or lesions.   Neck: supple, no masses  Respiratory: Fine rales bilaterally. Speaking full sentences. No accessory muscle use.  Cardiovascular: S1 & S2 heard,  regular rate and rhythm. Pretibial pitting edema bilaterally. Abdomen: No distension, no tenderness, soft. Bowel sounds active.  Musculoskeletal: no clubbing / cyanosis. No joint deformity upper and lower extremities.   Skin: no significant rashes, lesions, ulcers. Warm, dry, well-perfused. Neurologic: CN 2-12 grossly intact. Moving all extremities. Alert and oriented to person, place, and situation.  Psychiatric: Pleasant. Cooperative.    Labs and Imaging on Admission:  I have personally reviewed following labs and imaging studies  CBC: Recent Labs  Lab 04/03/22 0253  WBC 8.8  NEUTROABS 6.6  HGB 11.8*  HCT 36.9*  MCV 98.1  PLT 638   Basic Metabolic Panel: Recent Labs  Lab 04/03/22 0253  NA 143  K 4.6  CL 110  CO2 21*  GLUCOSE 169*  BUN 36*  CREATININE 2.52*  CALCIUM 8.8*   GFR: CrCl cannot be calculated (Unknown ideal weight.). Liver Function Tests: No results for input(s): "AST", "ALT", "ALKPHOS", "BILITOT", "PROT", "ALBUMIN" in the last 168 hours. No results for input(s): "LIPASE", "AMYLASE" in the last 168 hours. No results for input(s): "AMMONIA" in the last 168 hours. Coagulation Profile: No results for input(s): "INR", "PROTIME" in the last 168 hours. Cardiac Enzymes: No results for input(s): "CKTOTAL", "CKMB", "CKMBINDEX", "TROPONINI" in the last 168 hours. BNP (last 3 results) No results for input(s): "PROBNP" in the last 8760 hours. HbA1C: No results for input(s): "HGBA1C" in the last 72 hours. CBG: No results for input(s): "GLUCAP" in the last 168 hours. Lipid Profile: No results for input(s): "CHOL", "HDL", "LDLCALC", "TRIG", "CHOLHDL", "LDLDIRECT" in the last 72 hours. Thyroid Function Tests: No results for input(s): "TSH", "T4TOTAL", "FREET4", "T3FREE", "THYROIDAB" in the last 72 hours. Anemia Panel: No results for input(s): "VITAMINB12", "FOLATE", "FERRITIN", "TIBC", "IRON", "RETICCTPCT" in the last 72 hours. Urine analysis:    Component Value  Date/Time   COLORURINE YELLOW 01/13/2021 1337   APPEARANCEUR CLEAR 01/13/2021 1337   LABSPEC 1.012 01/13/2021 1337   PHURINE 5.0 01/13/2021 1337   GLUCOSEU NEGATIVE 01/13/2021 1337   HGBUR NEGATIVE 01/13/2021 1337   BILIRUBINUR NEGATIVE 01/13/2021 1337   [place] 01/13/2021 1337   PROTEINUR 30 (A) 01/13/2021 1337   NITRITE NEGATIVE 01/13/2021 1337   LEUKOCYTESUR NEGATIVE 01/13/2021 1337   Sepsis Labs: '@LABRCNTIP'$ (procalcitonin:4,lacticidven:4) )No results found for this or any previous visit (from the past 240 hour(s)).   Radiological Exams on Admission: DG Chest 2 View  Result Date: 04/03/2022 CLINICAL DATA:  Shortness of breath EXAM: CHEST - 2 VIEW COMPARISON:  01/13/2021 FINDINGS: Cardiac shadow is enlarged but stable. Pacing device is again seen. Diffuse increased vascular congestion is noted with interstitial edema and bilateral pleural effusions consistent with CHF. No acute bony abnormality is seen. IMPRESSION: Changes of CHF. Electronically Signed   By: Inez Catalina M.D.   On: 04/03/2022 03:14    EKG: Independently reviewed. AV paced rhythm.   Assessment/Plan   1. Acute CHF, unknown EF  - Presents with 3 days of progressive SOB, worse when trying to sleep last night  - Found to have pulmonary edema and BNP 1524  - Unknown EF - Given 80 mg IV Lasix in ED and put out 600 mL in the first hour - Continue diuresis with IV Lasix, obtain echocardiogram, monitor renal function    2. CKD IV  - SCr is 2.5 on admission, up from 1.9 in July 2022  - Renally-dose medications, monitor closely while diuresing   3. Hypertension  - Pt unsure of some of his medication, continue Norvasc and follow-up pharmacy medication reconciliation    4. CAD  - Hx of 2 stents in 2012  - No anginal complaints   - Troponin mildly elevated and flat, not suggestive of ACS  - Continue Lipitor    5. PAF  - Continue Eliquis and amiodarone    DVT prophylaxis: Eliquis  Code Status: DNR,  discussed on admission  Level of Care: Level of care: Telemetry  Cardiac Family Communication: Son at bedside  Disposition Plan:  Patient is from: home  Anticipated d/c is to: TBD Anticipated d/c date is: 04/06/22  Patient currently: Pending improved respiratory status, stable renal function  Consults called: none  Admission status: Inpatient     Vianne Bulls, MD [facility]  04/03/2022, 6:28 AM

## 2022-04-03 NOTE — ED Provider Triage Note (Signed)
  Emergency Medicine Provider Triage Evaluation Note  MRN:  [id number]  Arrival date & time: 04/03/22    Medically screening exam initiated at 2:47 AM.   CC:   Shortness of Breath   HPI:  Steven Rosario is a 88 y.o. year-old male presents to the ED with chief complaint of SOB and wheezing.  Onset tonight at 10pm.  Denies hx of the same.  Denies fever, chills, or cough. Denies any CP.  Received nebs by EMS with improvement.  On 3L [place].  History provided by History provided by patient. ROS:  -As included in HPI PE:   Vitals:   04/03/22 0241  BP: (!) 162/90  Pulse: 63  Resp: 20  Temp: 98.3 F (36.8 C)  SpO2: 91%    Non-toxic appearing No respiratory distress  MDM:   I've ordered labs and imaging in triage to expedite lab/diagnostic workup.  Patient was informed that the remainder of the evaluation will be completed by another provider, this initial triage assessment does not replace that evaluation, and the importance of remaining in the ED until their evaluation is complete.    Montine Circle, PA-C 04/03/22 [phone]

## 2022-04-03 NOTE — Progress Notes (Signed)
Progress Note   Patient: Steven Rosario QIH:[id number] DOB: 1928/10/27 DOA: 04/03/2022     0 DOS: the patient was seen and examined on 04/03/2022   Brief hospital course: Steven Rosario was admitted to the hospital with the working diagnosis of decompensated heart failure.  88 yo male with the past medical history of atrial fibrillation, sick sinus syndrome sp pace maker implantation, hypertension, CKD stage IV, and coronary artery disease who presented with dyspnea.  At his baseline he is physically functional, he lives independent and able to do his routine activities with no dyspnea, including yard work and driving.  Reported 3 days of progressive dyspnea, the night before admission he had orthopnea and PND. EMS was called and patient was brought to the hospital. His diuretic therapy was recently decreased. On his initial physical examination his 02 saturation was 86% on room air, blood pressure 159/76, HR 63, RR 22 and 02 saturation 98% on supplemental 02. Lungs with rales bilaterally, heart with S1 and S2 present and regular, abdomen not distended, positive bilateral lower extremity edema.   Na 143, K 4,6 Cl 110, bicarbonate 21, glucose 168, bun 36 cr 2,52  BNP 1,524  High sensitive troponin 46 and 47  Wbc 8,8 hgb 11.9 plt 194  Sars covid negative   Chest radiograph with cardiomegaly, positive bilateral hilar vascular congestion, small bilateral pleural effusion and left lower lobe atelectasis at the base.  Pacer in place with one atrial and one ventricular lead.   EKG 60 bpm, normal axis, left bundle branch, qtc 508, atrial and ventricular paced rhythm with no significant ST segment or T wave changes.   Assessment and Plan: * Acute CHF (congestive heart failure) ([place]) Patient with improvement in his symptoms but no yet back to baseline.   Elevated troponin due to heart failure decompensation.   Documented urine output 600 cc  Blood pressure 162 to 175 mmHg.   Plan to continue diuresis  with furosemide 60 mg IV Follow up on echocardiogram. Limited medical therapy due to low GFR.  Will add hydralazine for after load reduction.    Essential hypertension, benign Uncontrolled hypertension.  Continue blood pressure control with amlodipine, will add hydralazine for now.  Patient with low GFR and not able to start on RAS inhibition at this point.   Dyslipidemia, continue with statin therapy.   Acute kidney injury superimposed on chronic kidney disease ([place]) Renal function with serum cr at 2,52, K is 4,6 and serum bicarbonate at 21.  Plan to continue diuresis with furosemide and follow up renal function in am.  Avoid hypotension and nephrotoxic medications.   Coronary artery disease No angina, no signs of acute coronary syndrome.         Subjective: Patient with improvement in dyspnea but not back to baseline, no chest pain,.   Physical Exam: Vitals:   04/03/22 0800 04/03/22 1200 04/03/22 1330 04/03/22 1634  BP: (!) 158/77 (!) 161/82 (!) 155/70 (!) 161/72  Pulse: (!) 59 63 63 60  Resp: (!) 33 (!) '31 16 18  '$ Temp:   (!) 97.5 F (36.4 C) 97.7 F (36.5 C)  TempSrc:   Oral Oral  SpO2: 97% 96% 100% 97%  Weight:   87.4 kg   Height:   '5\' 9"'$  (1.753 m)    Neurology awake and alert ENT with no pallor Cardiovascular with S1 and S2 present and regular, no gallops, positive systolic murmur 3/6 at the apex Neck with moderate JVD Positive lower extremity edema++ Respiratory with rales  bilaterally Abdomen not distended Data Reviewed:    Family Communication: no family at the bediside   Disposition: Status is: Inpatient Remains inpatient appropriate because: heart failure   Planned Discharge Destination: Home  Author: Tawni Millers, MD 04/03/2022 4:40 PM  For on call review [email or web address].

## 2022-04-03 NOTE — Assessment & Plan Note (Signed)
Renal function with serum cr at 2,52, K is 4,6 and serum bicarbonate at 21.  Plan to continue diuresis with furosemide and follow up renal function in am.  Avoid hypotension and nephrotoxic medications.

## 2022-04-03 NOTE — ED Notes (Signed)
Pt soiled. Lines and brief changed.

## 2022-04-03 NOTE — Assessment & Plan Note (Addendum)
Patient with improvement in his symptoms but no yet back to baseline.   Elevated troponin due to heart failure decompensation.   Documented urine output 600 cc  Blood pressure 162 to 175 mmHg.   Plan to continue diuresis with furosemide 60 mg IV Follow up on echocardiogram. Limited medical therapy due to low GFR.  Will add hydralazine for after load reduction.

## 2022-04-03 NOTE — ED Provider Notes (Signed)
[facility] EMERGENCY DEPARTMENT Provider Note   CSN: [id number] Arrival date & time: 04/03/22  2774     History  Chief Complaint  Patient presents with   Shortness of Breath    Steven Rosario is a 88 y.o. male.  HPI     This is a 88 year old male with a history of chronic kidney disease who presents with shortness of breath.  Patient reports several day history of worsening shortness of breath.  He is also noted significant lower extremity swelling.  Has had a cough without fevers.  Does not note that his shortness of breath is worse with laying flat.  Denies any chest pain or significant exertional symptoms.  Patient did note that he saw his nephrologist recently and they adjusted his diuretic by reducing it.  He cannot tell me how much he takes.  He states that his urine output has generally gotten fairly poor.  Patient's son is at the bedside.  Confirms history.  Patient was brought in by EMS.  Per EMS report, hypoxic on room air to 86%.  Patient does not wear home oxygen.  Home Medications Prior to Admission medications   Medication Sig Start Date End Date Taking? Authorizing Provider  acetaminophen (TYLENOL) 650 MG CR tablet Take 650 mg by mouth every 6 (six) hours.    [provider]  allopurinol (ZYLOPRIM) 300 MG tablet Take 300 mg by mouth daily.    [provider]  amiodarone (PACERONE) 200 MG tablet Take 1 tablet (200 mg total) by mouth daily. 10/25/21   Camnitz, Will Hassell Done, MD  amLODipine (NORVASC) 10 MG tablet Take 10 mg by mouth daily.    [provider]  apixaban (ELIQUIS) 2.5 MG TABS tablet Take 2.5 mg by mouth 2 (two) times daily.    [provider]  Ascorbic Acid (VITA-C PO) Take 500 mg by mouth daily.    [provider]  aspirin 81 MG EC tablet Take 81 mg by mouth daily.    [provider]  atorvastatin (LIPITOR) 20 MG tablet Take 20 mg by mouth daily.    [provider]  bumetanide  (BUMEX) 1 MG tablet Take 1 mg by mouth daily.    [provider]  carvedilol (COREG) 12.5 MG tablet Take 1 tablet (12.5 mg total) by mouth 2 (two) times daily. 07/17/21   Camnitz, Ocie Doyne, MD  Cholecalciferol (VITAMIN D-3 PO) Take 2,000 Units by mouth 2 (two) times daily.    [provider]  ferrous sulfate 324 MG TBEC Take 324 mg by mouth daily with breakfast.    [provider]  gabapentin (NEURONTIN) 300 MG capsule Take 300 mg by mouth in the morning and at bedtime. 11/14/20   [provider]  GLUCOSAMINE-CHONDROITIN PO Take 1 tablet by mouth in the morning and at bedtime. 5 mg twice a day    [provider]  lidocaine (LIDODERM) 5 % Place 1 patch onto the skin daily. Remove & Discard patch within 12 hours or as directed by MD 04/22/21   Dorethea Clan, DO  multivitamin (ONE-A-DAY MEN'S) TABS tablet Take 1 tablet by mouth daily.    [provider]  pantoprazole (PROTONIX) 20 MG tablet Take 20 mg by mouth daily.    [provider]  polycarbophil (FIBERCON) 625 MG tablet Take 625 mg by mouth in the morning and at bedtime.    [provider]  venlafaxine XR (EFFEXOR-XR) 150 MG 24 hr capsule Take 150 mg  by mouth at bedtime. 11/22/20   [provider]  vitamin B-12 (CYANOCOBALAMIN) 1000 MCG tablet Take 1,000 mcg by mouth daily.    [provider]      Allergies    Patient has no known allergies.    Review of Systems   Review of Systems  Constitutional:  Negative for fever.  Respiratory:  Positive for cough and shortness of breath.   Cardiovascular:  Positive for leg swelling. Negative for chest pain.  Gastrointestinal:  Negative for abdominal pain.  All other systems reviewed and are negative.   Physical Exam Updated Vital Signs BP (!) 162/90 (BP Location: Left Arm)   Pulse 63   Temp 98.3 F (36.8 C) (Oral)   Resp 20   SpO2 91%  Physical Exam Vitals and nursing note reviewed.  Constitutional:       Appearance: He is well-developed.     Comments: Elderly, nontoxic-appearing  HENT:     Head: Normocephalic and atraumatic.     Mouth/Throat:     Mouth: Mucous membranes are moist.  Eyes:     Pupils: Pupils are equal, round, and reactive to light.  Cardiovascular:     Rate and Rhythm: Normal rate and regular rhythm.     Heart sounds: Normal heart sounds. No murmur heard. Pulmonary:     Effort: Pulmonary effort is normal. No respiratory distress.     Breath sounds: Normal breath sounds. No wheezing.     Comments: Crackles bilateral lower lobes Abdominal:     General: Bowel sounds are normal.     Palpations: Abdomen is soft.     Tenderness: There is no abdominal tenderness. There is no rebound.  Musculoskeletal:     Comments: 2+ pitting edema bilaterally  Lymphadenopathy:     Cervical: No cervical adenopathy.  Skin:    General: Skin is warm and dry.  Neurological:     Mental Status: He is alert and oriented to person, place, and time.  Psychiatric:        Mood and Affect: Mood normal.     ED Results / Procedures / Treatments   Labs (all labs ordered are listed, but only abnormal results are displayed) Labs Reviewed  CBC WITH DIFFERENTIAL/PLATELET - Abnormal; Notable for the following components:      Result Value   RBC 3.76 (*)    Hemoglobin 11.8 (*)    HCT 36.9 (*)    RDW 16.0 (*)    All other components within normal limits  BASIC METABOLIC PANEL - Abnormal; Notable for the following components:   CO2 21 (*)    Glucose, Bld 169 (*)    BUN 36 (*)    Creatinine, Ser 2.52 (*)    Calcium 8.8 (*)    GFR, Estimated 24 (*)    All other components within normal limits  TROPONIN I (HIGH SENSITIVITY) - Abnormal; Notable for the following components:   Troponin I (High Sensitivity) 46 (*)    All other components within normal limits  SARS CORONAVIRUS 2 BY RT PCR  BRAIN NATRIURETIC PEPTIDE  TROPONIN I (HIGH SENSITIVITY)    EKG EKG  Interpretation  Date/Time:  Tuesday April 03 2022 02:48:04 EDT Ventricular Rate:  60 PR Interval:  208 QRS Duration: 152 QT Interval:  508 QTC Calculation: 508 R Axis:   -74 Text Interpretation: AV dual-paced rhythm Abnormal ECG When compared with ECG of 13-Jan-2021 09:52, PREVIOUS ECG IS PRESENT No significant change since last tracing Confirmed by Thayer Jew [id number]) on  04/03/2022 3:48:14 AM  Radiology DG Chest 2 View  Result Date: 04/03/2022 CLINICAL DATA:  Shortness of breath EXAM: CHEST - 2 VIEW COMPARISON:  01/13/2021 FINDINGS: Cardiac shadow is enlarged but stable. Pacing device is again seen. Diffuse increased vascular congestion is noted with interstitial edema and bilateral pleural effusions consistent with CHF. No acute bony abnormality is seen. IMPRESSION: Changes of CHF. Electronically Signed   By: Inez Catalina M.D.   On: 04/03/2022 03:14    Procedures .Critical Care  Performed by: Merryl Hacker, MD Authorized by: Merryl Hacker, MD   Critical care provider statement:    Critical care time (minutes):  40   Critical care was necessary to treat or prevent imminent or life-threatening deterioration of the following conditions:  Cardiac failure   Critical care was time spent personally by me on the following activities:  Development of treatment plan with patient or surrogate, discussions with consultants, evaluation of patient's response to treatment, examination of patient, ordering and review of laboratory studies, ordering and review of radiographic studies, ordering and performing treatments and interventions, pulse oximetry, re-evaluation of patient's condition and review of old charts     Medications Ordered in ED Medications  furosemide (LASIX) injection 80 mg (has no administration in time range)    ED Course/ Medical Decision Making/ A&P                           Medical Decision Making Risk Prescription drug management. Decision regarding  hospitalization.   This patient presents to the ED for concern of shortness of breath, this involves an extensive number of treatment options, and is a complaint that carries with it a high risk of complications and morbidity.  I considered the following differential and admission for this acute, potentially life threatening condition.  The differential diagnosis includes CHF, pneumonia, PE, ACS  MDM:    This is a 88 year old male who presents with progressive shortness of breath.  Also notable lower extremity edema.  Nontoxic.  Is requiring 3 L supplemental oxygen.  O2 sats in the low to mid 90s on 3 L.  He is in no overt respiratory distress.  Chest x-ray is concerning for changes consistent with heart failure including pulmonary edema.  He does not have an echocardiogram in our system and no known history of failure.  He does take Bumex but I am unsure of the current dosing.  He also has progressively worsening renal failure which is likely contributory as well.  Today his creatinine is 2.52.  Looks like his baseline creatinine is somewhere between 1.9 and 2.5.  Patient was given 1 dose of 80 mg of IV Lasix.  Given his oxygen requirement, feel he needs admission for diuresis and monitoring closely of his kidney function.  He is followed closely by [facility].  Likely would need echocardiogram as well.  (Labs, imaging, consults)  Labs: I Ordered, and personally interpreted labs.  The pertinent results include: CBC, BMP, troponin, BNP  Imaging Studies ordered: I ordered imaging studies including chest x-ray I independently visualized and interpreted imaging. I agree with the radiologist interpretation  Additional history obtained from son at bedside.  External records from outside source obtained and reviewed including cardiology notes  Cardiac Monitoring: The patient was maintained on a cardiac monitor.  I personally viewed and interpreted the cardiac monitored which showed an underlying  rhythm of: Normal sinus rhythm  Reevaluation: After the interventions noted above, I  reevaluated the patient and found that they have :stayed the same  Social Determinants of Health: Lives independently  Disposition: Admit  Co morbidities that complicate the patient evaluation  Past Medical History:  Diagnosis Date   Acute kidney injury (AKI) with acute tubular necrosis (ATN) ([facility])    Cervical disc disease    Chronic kidney disease    Coronary artery disease    Gout    Hypertension    Nephrolithiasis    Pneumonia due to COVID-19 virus    Presence of stent in LAD coronary artery    Prostate cancer (HCC)    S/P placement of cardiac pacemaker    Urinary incontinence      Medicines Meds ordered this encounter  Medications   furosemide (LASIX) injection 80 mg    I have reviewed the patients home medicines and have made adjustments as needed  Problem List / ED Course: Problem List Items Addressed This Visit   None Visit Diagnoses     Acute respiratory failure with hypoxia ([place])    -  Primary   Acute pulmonary edema (HCC)       Chronic kidney disease, unspecified CKD stage                       Final Clinical Impression(s) / ED Diagnoses Final diagnoses:  Acute respiratory failure with hypoxia ([place])  Acute pulmonary edema ([place])  Chronic kidney disease, unspecified CKD stage    Rx / DC Orders ED Discharge Orders     None         Merryl Hacker, MD 04/03/22 [phone]

## 2022-04-03 NOTE — Assessment & Plan Note (Signed)
No angina, no signs of acute coronary syndrome.

## 2022-04-03 NOTE — Hospital Course (Signed)
[name] was admitted to the hospital with the working diagnosis of decompensated heart failure.  88 yo male with the past medical history of atrial fibrillation, sick sinus syndrome sp pace maker implantation, hypertension, CKD stage IV, and coronary artery disease who presented with dyspnea.  At his baseline he is physically functional, he lives independent and able to do his routine activities with no dyspnea, including yard work and driving.  Reported 3 days of progressive dyspnea, the night before admission he had orthopnea and PND. EMS was called and patient was brought to the hospital. His diuretic therapy was recently decreased. On his initial physical examination his 02 saturation was 86% on room air, blood pressure 159/76, HR 63, RR 22 and 02 saturation 98% on supplemental 02. Lungs with rales bilaterally, heart with S1 and S2 present and regular, abdomen not distended, positive bilateral lower extremity edema.   Na 143, K 4,6 Cl 110, bicarbonate 21, glucose 168, bun 36 cr 2,52  BNP 1,524  High sensitive troponin 46 and 47  Wbc 8,8 hgb 11.9 plt 194  Sars covid negative   Chest radiograph with cardiomegaly, positive bilateral hilar vascular congestion, small bilateral pleural effusion and left lower lobe atelectasis at the base.  Pacer in place with one atrial and one ventricular lead.   EKG 60 bpm, normal axis, left bundle branch, qtc 508, atrial and ventricular paced rhythm with no significant ST segment or T wave changes.

## 2022-04-03 NOTE — Assessment & Plan Note (Addendum)
Uncontrolled hypertension.  Continue blood pressure control with amlodipine, will add hydralazine for now.  Patient with low GFR and not able to start on RAS inhibition at this point.   Dyslipidemia, continue with statin therapy.

## 2022-04-03 NOTE — ED Triage Notes (Signed)
Pt brought to ED by [facility] with c/o shortness o breath x3 days that increased this evening while trying to get in bed. EMS states he was initially 86% RA and placed on non-rebreather and saturations increased to 100%.  EMS Interventions Albuterol 2.'5mg'$  20g IV RAC    EMS Vitals BP 168/74 HR 74 RR 18 SPO2 100% 3L  CBG 162

## 2022-04-03 NOTE — ED Notes (Signed)
The son Ian is requesting to add additional contact number (his wife Hassan Rowan [phone]). Please contact when pt goes upstairs.

## 2022-04-04 ENCOUNTER — Inpatient Hospital Stay (HOSPITAL_COMMUNITY): Payer: Medicare HMO

## 2022-04-04 DIAGNOSIS — I509 Heart failure, unspecified: Secondary | ICD-10-CM | POA: Diagnosis not present

## 2022-04-04 DIAGNOSIS — R0602 Shortness of breath: Secondary | ICD-10-CM

## 2022-04-04 DIAGNOSIS — I10 Essential (primary) hypertension: Secondary | ICD-10-CM | POA: Diagnosis not present

## 2022-04-04 DIAGNOSIS — N189 Chronic kidney disease, unspecified: Secondary | ICD-10-CM | POA: Diagnosis not present

## 2022-04-04 DIAGNOSIS — N179 Acute kidney failure, unspecified: Secondary | ICD-10-CM | POA: Diagnosis not present

## 2022-04-04 LAB — BASIC METABOLIC PANEL
Anion gap: 10 (ref 5–15)
BUN: 39 mg/dL — ABNORMAL HIGH (ref 8–23)
CO2: 26 mmol/L (ref 22–32)
Calcium: 8.6 mg/dL — ABNORMAL LOW (ref 8.9–10.3)
Chloride: 106 mmol/L (ref 98–111)
Creatinine, Ser: 2.47 mg/dL — ABNORMAL HIGH (ref 0.61–1.24)
GFR, Estimated: 24 mL/min — ABNORMAL LOW (ref >60)
Glucose, Bld: 121 mg/dL — ABNORMAL HIGH (ref 70–99)
Potassium: 4 mmol/L (ref 3.5–5.1)
Sodium: 142 mmol/L (ref 135–145)

## 2022-04-04 LAB — CBC
HCT: 35 % — ABNORMAL LOW (ref 39.0–52.0)
Hemoglobin: 11.2 g/dL — ABNORMAL LOW (ref 13.0–17.0)
MCH: 30.4 pg (ref 26.0–34.0)
MCHC: 32 g/dL (ref 30.0–36.0)
MCV: 95.1 fL (ref 80.0–100.0)
Platelets: 195 10*3/uL (ref 150–400)
RBC: 3.68 MIL/uL — ABNORMAL LOW (ref 4.22–5.81)
RDW: 15.9 % — ABNORMAL HIGH (ref 11.5–15.5)
WBC: 8.1 10*3/uL (ref 4.0–10.5)
nRBC: 0 % (ref 0.0–0.2)

## 2022-04-04 LAB — ECHOCARDIOGRAM COMPLETE
Area-P 1/2: 3.27 cm2
Calc EF: 29.1 %
Height: 69 in
S' Lateral: 3.7 cm
Single Plane A2C EF: 18.4 %
Single Plane A4C EF: 42.5 %
Weight: 3051.2 oz

## 2022-04-04 MED ORDER — PERFLUTREN LIPID MICROSPHERE
1.0000 mL | INTRAVENOUS | Status: AC | PRN
  Administered 2022-04-04: 2 mL via INTRAVENOUS

## 2022-04-04 NOTE — Progress Notes (Signed)
   04/04/22 1435  Clinical Encounter Type  Visited With Patient  Visit Type Spiritual support  Referral From Physician;Nurse Bonnielee Haff, MD; Lillette Boxer. Buena Irish, RN)  Consult/Referral To Chaplain Albertina Parr Morene Crocker)  Spiritual Encounters  Spiritual Needs Emotional;Grief support;Prayer   Had pleasure to meet with Mr. Jaevion Goto at patient's bedside. Mr. 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 Chaplain provided compassionate meaningful presence, reflective listening, and accommodated patient's request for prayer consistent with Mr. Kuhnle faith tradition.  Adams, M.Min. [phone].

## 2022-04-04 NOTE — Progress Notes (Signed)
Patient is hard of hearing BUT does not have his hearing aids with him at the hospital. States he left them at home.

## 2022-04-04 NOTE — Evaluation (Signed)
Occupational Therapy Evaluation Patient Details Name: Steven Rosario MRN: [id number] DOB: 04-13-1929 Today's Date: 04/04/2022   History of Present Illness 88 y.o. male with medical history significant for sick sinus syndrome with pacer, paroxysmal atrial fibrillation on Eliquis, CAD, hypertension, and CKD stage IV, now presented to the emergency department with worsening shortness of breath.   Clinical Impression   Patient admitted for the diagnosis above.  PTA the patient lives alone, continues to drive, uses a 4WRW for mobility, and completes his own ADL/iADL.  Unsure about baseline cognition, patient is very verbose with tangential thought process.  Currently he is presenting with generalized weakness and decreased activity tolerance.  OT will continue efforts in the acute setting to ensure baseline function, and no post acute OT is anticipated.        Recommendations for follow up therapy are one component of a multi-disciplinary discharge planning process, led by the attending physician.  Recommendations may be updated based on patient status, additional functional criteria and insurance authorization.   Follow Up Recommendations  No OT follow up    Assistance Recommended at Discharge Intermittent Supervision/Assistance  Patient can return home with the following Assist for transportation    Functional Status Assessment  Patient has had a recent decline in their functional status and demonstrates the ability to make significant improvements in function in a reasonable and predictable amount of time.  Equipment Recommendations  None recommended by OT    Recommendations for Other Services       Precautions / Restrictions Precautions Precautions: Fall Restrictions Weight Bearing Restrictions: No      Mobility Bed Mobility Overal bed mobility: Needs Assistance Bed Mobility: Supine to Sit     Supine to sit: Min guard          Transfers Overall transfer level: Needs  assistance Equipment used: Rolling walker (2 wheels) Transfers: Sit to/from Stand, Bed to chair/wheelchair/BSC Sit to Stand: Min guard     Step pivot transfers: Min guard            Balance Overall balance assessment: Needs assistance Sitting-balance support: Feet supported, Single extremity supported Sitting balance-Leahy Scale: Fair     Standing balance support: Reliant on assistive device for balance Standing balance-Leahy Scale: Poor                             ADL either performed or assessed with clinical judgement   ADL Overall ADL's : Needs assistance/impaired Eating/Feeding: Set up;Sitting   Grooming: Set up;Sitting           Upper Body Dressing : Set up;Sitting   Lower Body Dressing: Min guard;Sit to/from stand   Toilet Transfer: Min guard           Functional mobility during ADLs: Min guard       Vision Baseline Vision/History: 1 Wears glasses Patient Visual Report: No change from baseline       Perception Perception Perception: Within Functional Limits   Praxis Praxis Praxis: Intact    Pertinent Vitals/Pain Pain Assessment Pain Assessment: No/denies pain     Hand Dominance Right   Extremity/Trunk Assessment Upper Extremity Assessment Upper Extremity Assessment: Overall WFL for tasks assessed   Lower Extremity Assessment Lower Extremity Assessment: Defer to PT evaluation   Cervical / Trunk Assessment Cervical / Trunk Assessment: Kyphotic   Communication Communication Communication: No difficulties   Cognition Arousal/Alertness: Awake/alert Behavior During Therapy: WFL for tasks assessed/performed Overall Cognitive Status: No  family/caregiver present to determine baseline cognitive functioning                                 General Comments: Very tangential thought process and conversation, but following commands and A&Ox4     General Comments   VSS    Exercises     Shoulder Instructions       Home Living Family/patient expects to be discharged to:: Private residence Living Arrangements: Alone Available Help at Discharge: Family;Available PRN/intermittently Type of Home: House Home Access: Stairs to enter Entrance Stairs-Number of Steps: 4   Home Layout: One level     Bathroom Shower/Tub: Tub/shower unit;Walk-in shower   Bathroom Toilet: Standard Bathroom Accessibility: Yes How Accessible: Accessible via walker Home Equipment: Rollator (4 wheels)   Additional Comments: 3 4WRW's.  Keeps on for outside, one in his car, and one for in the home.      Prior Functioning/Environment Prior Level of Function : Independent/Modified Independent;Driving             Mobility Comments: Uses 4WRW ADLs Comments: Patient stating he cares for his own ADL, iADL.  Son assists if needed        OT Problem List: Decreased activity tolerance;Impaired balance (sitting and/or standing)      OT Treatment/Interventions: Self-care/ADL training;Therapeutic activities;Balance training;Patient/family education    OT Goals(Current goals can be found in the care plan section) Acute Rehab OT Goals Patient Stated Goal: Return home OT Goal Formulation: With patient Time For Goal Achievement: 04/18/22 Potential to Achieve Goals: Good ADL Goals Pt Will Perform Grooming: with modified independence;sitting;standing Pt Will Perform Lower Body Dressing: with modified independence;sit to/from stand Pt Will Transfer to Toilet: with modified independence;regular height toilet;ambulating  OT Frequency: Min 2X/week    Co-evaluation              AM-PAC OT "6 Clicks" Daily Activity     Outcome Measure Help from another person eating meals?: None Help from another person taking care of personal grooming?: None Help from another person toileting, which includes using toliet, bedpan, or urinal?: A Little Help from another person bathing (including washing, rinsing, drying)?: A Little Help  from another person to put on and taking off regular upper body clothing?: None Help from another person to put on and taking off regular lower body clothing?: A Little 6 Click Score: 21   End of Session Equipment Utilized During Treatment: Rolling walker (2 wheels) Nurse Communication: Mobility status  Activity Tolerance: Patient tolerated treatment well Patient left: in chair;with call bell/phone within reach;with chair alarm set  OT Visit Diagnosis: Unsteadiness on feet (R26.81);Other symptoms and signs involving cognitive function                Time: 1140-1200 OT Time Calculation (min): 20 min Charges:  OT General Charges $OT Visit: 1 Visit OT Evaluation $OT Eval Moderate Complexity: 1 Mod  04/04/2022  RP, OTR/L  Acute Rehabilitation Services  Office:  [phone]   Metta Clines 04/04/2022, 12:09 PM

## 2022-04-04 NOTE — Progress Notes (Signed)
PT Cancellation Note  Patient Details Name: Steven Rosario MRN: [id number] DOB: 1929/07/19   Cancelled Treatment:    Reason Eval/Treat Not Completed: Patient at procedure or test/unavailable. Pt undergoing Echo. PT to re-attempt as time allows.   Lorriane Shire 04/04/2022, 10:34 AM

## 2022-04-04 NOTE — Progress Notes (Signed)
[facility] PROGRESS NOTE   Steven Rosario QBH:[id number] DOB: 25-May-1929 DOA: 04/03/2022  PCP: Angelina Sheriff, MD  Brief History/Interval Summary: 88 yo male with the past medical history of atrial fibrillation, sick sinus syndrome s/p pacemaker implantation, hypertension, CKD stage IV, and coronary artery disease who presented with dyspnea.  There was concern for fluid overload.  Patient was given IV furosemide and was hospitalized for further management.  He was also noted to be hypoxic initially with saturations of 86% on room air.  Consultants: None  Procedures: Transthoracic echocardiogram is pending    Subjective/Interval History: Patient mentioned that his shortness of breath is improving.  He denies any chest pain.  No nausea or vomiting.     Assessment/Plan:  Acute CHF (congestive heart failure)  Echocardiogram is pending.  Type of CHF is not known at this time. Patient has improved with IV furosemide.  He has diuresed well.  Weight is decreasing.  Renal function is holding steady.  Continue to monitor for now.  Wait on echocardiogram.   Atrial fibrillation/sick sinus syndrome Patient is status post pacemaker.  Heart rate noted to be stable.  Patient is on amiodarone and apixaban which is being continued.  Essential hypertension Continue amlodipine.  Noted to be on hydralazine.  Monitor blood pressures closely.  Dyslipidemia Continue with statin therapy.    Acute kidney injury superimposed on chronic kidney disease stage IV Noted to have a creatinine of 2.5 in April 2022, creatinine 1.93 in July 2022.  Came in with creatinine of 2.512.  Seems to be reasonably close to his baseline.   Monitor renal function closely while he is getting diuresed.  Monitor urine output.  Avoid nephrotoxic agents.     Coronary artery disease No angina, no signs of acute coronary syndrome.   Normocytic anemia No evidence of overt bleeding.  Likely anemia of chronic kidney  disease.   DVT Prophylaxis: On apixaban Code Status: DNR Family Communication: Discussed with patient.  No family at bedside. Disposition Plan: Patient mentioned that he lives by himself.  Has had mobility issues.  Will involve physical therapy.  Status is: Inpatient Remains inpatient appropriate because: IV diuresis for presumed congestive heart failure      Medications: Scheduled:  amiodarone  200 mg Oral Daily   amLODipine  5 mg Oral Daily   apixaban  2.5 mg Oral BID   atorvastatin  20 mg Oral Daily   furosemide  60 mg Intravenous Q12H   hydrALAZINE  50 mg Oral BID   pantoprazole  20 mg Oral Daily   sodium chloride flush  3 mL Intravenous Q12H   Continuous: OXB:[id number] **OR** acetaminophen, alum & mag hydroxide-simeth, senna  Antibiotics: Anti-infectives (From admission, onward)    None       Objective:  Vital Signs  Vitals:   04/03/22 2006 04/04/22 0013 04/04/22 0419 04/04/22 0715  BP:  (!) 160/79 (!) 147/77 (!) 169/67  Pulse:  64 60 60  Resp: '20 18 18 17  '$ Temp: 98.6 F (37 C) 97.8 F (36.6 C) 97.6 F (36.4 C) 97.6 F (36.4 C)  TempSrc: Oral Oral Oral Oral  SpO2:  92% 94% 96%  Weight:   86.5 kg   Height:        Intake/Output Summary (Last 24 hours) at 04/04/2022 1003 Last data filed at 04/04/2022 0815 Gross per 24 hour  Intake 840 ml  Output 3025 ml  Net -2185 ml   Filed Weights   04/03/22 1330 04/04/22 0419  Weight: 87.4 kg 86.5 kg    General appearance: Awake alert.  In no distress Resp: Mildly tachypneic.  No use of accessory muscles.  Coarse breath sounds with crackles in the bases.  No wheezing or rhonchi. Cardio: S1-S2 is normal regular.  No S3-S4.  No rubs murmurs or bruit GI: Abdomen is soft.  Nontender nondistended.  Bowel sounds are present normal.  No masses organomegaly Extremities: No edema.  Full range of motion of lower extremities. Neurologic: Alert and oriented x3.  No focal neurological deficits.    Lab  Results:  Data Reviewed: I have personally reviewed following labs and reports of the imaging studies  CBC: Recent Labs  Lab 04/03/22 0253 04/04/22 0303  WBC 8.8 8.1  NEUTROABS 6.6  --   HGB 11.8* 11.2*  HCT 36.9* 35.0*  MCV 98.1 95.1  PLT 194 081    Basic Metabolic Panel: Recent Labs  Lab 04/03/22 0253 04/04/22 0303  NA 143 142  K 4.6 4.0  CL 110 106  CO2 21* 26  GLUCOSE 169* 121*  BUN 36* 39*  CREATININE 2.52* 2.47*  CALCIUM 8.8* 8.6*    GFR: Estimated Creatinine Clearance: 21.6 mL/min (A) (by C-G formula based on SCr of 2.47 mg/dL (H)).     Recent Results (from the past 240 hour(s))  SARS Coronavirus 2 by RT PCR (hospital order, performed in [facility] hospital lab) *cepheid single result test* Anterior Nasal Swab     Status: None   Collection Time: 04/03/22  6:09 AM   Specimen: Anterior Nasal Swab  Result Value Ref Range Status   SARS Coronavirus 2 by RT PCR NEGATIVE NEGATIVE Final    Comment: (NOTE) SARS-CoV-2 target nucleic acids are NOT DETECTED.  The SARS-CoV-2 RNA is generally detectable in upper and lower respiratory specimens during the acute phase of infection. The lowest concentration of SARS-CoV-2 viral copies this assay can detect is 250 copies / mL. A negative result does not preclude SARS-CoV-2 infection and should not be used as the sole basis for treatment or other patient management decisions.  A negative result may occur with improper specimen collection / handling, submission of specimen other than nasopharyngeal swab, presence of viral mutation(s) within the areas targeted by this assay, and inadequate number of viral copies (<250 copies / mL). A negative result must be combined with clinical observations, patient history, and epidemiological information.  Fact Sheet for Patients:   https://www.patel.info/  Fact Sheet for Healthcare Providers: https://hall.com/  This test is not yet  approved or  cleared by the [place] FDA and has been authorized for detection and/or diagnosis of SARS-CoV-2 by FDA under an Emergency Use Authorization (EUA).  This EUA will remain in effect (meaning this test can be used) for the duration of the COVID-19 declaration under Section 564(b)(1) of the Act, 21 U.S.C. section 360bbb-3(b)(1), unless the authorization is terminated or revoked sooner.  Performed at [facility], [place] [address] [place], [place] [address]       Radiology Studies: DG Chest 2 View  Result Date: 04/03/2022 CLINICAL DATA:  Shortness of breath EXAM: CHEST - 2 VIEW COMPARISON:  01/13/2021 FINDINGS: Cardiac shadow is enlarged but stable. Pacing device is again seen. Diffuse increased vascular congestion is noted with interstitial edema and bilateral pleural effusions consistent with CHF. No acute bony abnormality is seen. IMPRESSION: Changes of CHF. Electronically Signed   By: Inez Catalina M.D.   On: 04/03/2022 03:14       LOS: 1  day   [facility] Pager on [email or web address]  04/04/2022, 10:03 AM

## 2022-04-04 NOTE — TOC Progression Note (Addendum)
Transition of Care [facility]) - Progression Note    Patient Details  Name: Steven Rosario MRN: [id number] Date of Birth: March 20, 1929  [facility] CM/SW Contact  Zenon Mayo, RN Phone Number: 04/04/2022, 4:31 PM  Clinical Narrative:    from home,  acute CHF, cotns on lasix drip, indep, awaiting pt eval. TOC following.        Expected Discharge Plan and Services                                                 Social Determinants of Health (SDOH) Interventions    Readmission Risk Interventions     No data to display

## 2022-04-04 NOTE — Plan of Care (Signed)
  Problem: Education: Goal: Ability to demonstrate management of disease process will improve Outcome: Progressing Goal: Ability to verbalize understanding of medication therapies will improve Outcome: Progressing Goal: Individualized Educational Video(s) Outcome: Progressing   Problem: Activity: Goal: Capacity to carry out activities will improve Outcome: Progressing   Problem: Education: Goal: Knowledge of General Education information will improve Description: Including pain rating scale, medication(s)/side effects and non-pharmacologic comfort measures Outcome: Progressing   Problem: Health Behavior/Discharge Planning: Goal: Ability to manage health-related needs will improve Outcome: Progressing   Problem: Clinical Measurements: Goal: Ability to maintain clinical measurements within normal limits will improve Outcome: Progressing Goal: Will remain free from infection Outcome: Progressing Goal: Diagnostic test results will improve Outcome: Progressing Goal: Respiratory complications will improve Outcome: Progressing Goal: Cardiovascular complication will be avoided Outcome: Progressing   Problem: Nutrition: Goal: Adequate nutrition will be maintained Outcome: Progressing   Problem: Activity: Goal: Risk for activity intolerance will decrease Outcome: Progressing   Problem: Coping: Goal: Level of anxiety will decrease Outcome: Progressing   Problem: Elimination: Goal: Will not experience complications related to bowel motility Outcome: Progressing Goal: Will not experience complications related to urinary retention Outcome: Progressing   Problem: Pain Managment: Goal: General experience of comfort will improve Outcome: Progressing   Problem: Safety: Goal: Ability to remain free from injury will improve Outcome: Progressing   Problem: Skin Integrity: Goal: Risk for impaired skin integrity will decrease Outcome: Progressing

## 2022-04-04 NOTE — Progress Notes (Signed)
Heart Failure Navigator Progress Note  Following this hospitalization to assess for HV TOC readiness.   Echo pending/  IV Lasix  Earnestine Leys, BSN, RN Heart Failure Transport planner Only

## 2022-04-05 ENCOUNTER — Inpatient Hospital Stay (HOSPITAL_COMMUNITY): Payer: Medicare HMO

## 2022-04-05 ENCOUNTER — Encounter (HOSPITAL_COMMUNITY): Payer: Self-pay | Admitting: Family Medicine

## 2022-04-05 DIAGNOSIS — I5041 Acute combined systolic (congestive) and diastolic (congestive) heart failure: Secondary | ICD-10-CM

## 2022-04-05 DIAGNOSIS — I10 Essential (primary) hypertension: Secondary | ICD-10-CM | POA: Diagnosis not present

## 2022-04-05 DIAGNOSIS — N189 Chronic kidney disease, unspecified: Secondary | ICD-10-CM | POA: Diagnosis not present

## 2022-04-05 DIAGNOSIS — N179 Acute kidney failure, unspecified: Secondary | ICD-10-CM | POA: Diagnosis not present

## 2022-04-05 LAB — BASIC METABOLIC PANEL
Anion gap: 13 (ref 5–15)
BUN: 39 mg/dL — ABNORMAL HIGH (ref 8–23)
CO2: 26 mmol/L (ref 22–32)
Calcium: 8.8 mg/dL — ABNORMAL LOW (ref 8.9–10.3)
Chloride: 101 mmol/L (ref 98–111)
Creatinine, Ser: 2.7 mg/dL — ABNORMAL HIGH (ref 0.61–1.24)
GFR, Estimated: 22 mL/min — ABNORMAL LOW (ref >60)
Glucose, Bld: 100 mg/dL — ABNORMAL HIGH (ref 70–99)
Potassium: 3.8 mmol/L (ref 3.5–5.1)
Sodium: 140 mmol/L (ref 135–145)

## 2022-04-05 MED ORDER — FUROSEMIDE 20 MG PO TABS
60.0000 mg | ORAL_TABLET | Freq: Two times a day (BID) | ORAL | Status: DC
  Administered 2022-04-06: 60 mg via ORAL
  Filled 2022-04-05: qty 1

## 2022-04-05 MED ORDER — ALLOPURINOL 300 MG PO TABS
300.0000 mg | ORAL_TABLET | Freq: Every day | ORAL | Status: DC
  Administered 2022-04-05 – 2022-04-06 (×2): 300 mg via ORAL
  Filled 2022-04-05 (×2): qty 1

## 2022-04-05 MED ORDER — CARVEDILOL 12.5 MG PO TABS
12.5000 mg | ORAL_TABLET | Freq: Two times a day (BID) | ORAL | Status: DC
  Administered 2022-04-05 – 2022-04-06 (×3): 12.5 mg via ORAL
  Filled 2022-04-05 (×3): qty 1

## 2022-04-05 NOTE — Progress Notes (Signed)
Heart Failure Nurse Navigator Progress Note  PCP: Angelina Sheriff, MD PCP-Cardiologist: Curt Bears Admission Diagnosis: Acute respiratory failure with hypoxia, Acute pulmonary edema, Chronic Kidney Disease.  Admitted from: Home/ [place] via EMS  Presentation:   Steven Rosario presented with shortness of breath, worsening over the last several days, bilateral lower extremity edema, and a cough. Recently his nephrologist adjusted his diuretic by reducing it. When EMS arrived at his home his O2 sats were 84% on room air. BP 162/90, HR 63, 2 + bilateral pitting edema. Creatinine 2.52, Troponin 46, BNP 1,524. IV lasix 80 mg given, and placed on 3 L oxygen.   Patient is a very pleasant man who was educated on the sign and symptoms of heart failure, weighing himself daily, he has a home scale, when to call his doctor or go to the ER. Diet. Fluid restrictions, patient states that he eats out a lot or his church friends make him his meals. He has a pill box and takes all his medications as prescribed and attends all his doctor appointments. Patient interacted very well and asked a number of questions, he verbalized his understanding of the education. He is scheduled for a hospital HF TOC follow up on 04/16/22 @ 12 noon.   ECHO/ LVEF: 35-40% HFrEF  G2DD  Clinical Course:  Past Medical History:  Diagnosis Date   Acute kidney injury (AKI) with acute tubular necrosis (ATN) (HCC)    Cervical disc disease    Chronic kidney disease    Coronary artery disease    Gout    Hypertension    Nephrolithiasis    Pneumonia due to COVID-19 virus    Presence of stent in LAD coronary artery    Prostate cancer (HCC)    S/P placement of cardiac pacemaker    Urinary incontinence      Social History   Socioeconomic History   Marital status: Widowed    Spouse name: Not on file   Number of children: 4   Years of education: Not on file   Highest education level: 8th grade  Occupational History   Occupation:  retired   Tobacco Use   Smoking status: Never   Smokeless tobacco: Never  Vaping Use   Vaping Use: Never used  Substance and Sexual Activity   Alcohol use: Not Currently    Comment: past   Drug use: Never   Sexual activity: Not on file  Other Topics Concern   Not on file  Social History Narrative   Not on file   Social Determinants of Health   Financial Resource Strain: Not on file  Food Insecurity: No Food Insecurity (04/05/2022)   Hunger Vital Sign    Worried About Running Out of Food in the Last Year: Never true    Ran Out of Food in the Last Year: Never true  Transportation Needs: No Transportation Needs (04/05/2022)   PRAPARE - Hydrologist (Medical): No    Lack of Transportation (Non-Medical): No  Physical Activity: Not on file  Stress: Not on file  Social Connections: Not on file   Education Assessment and Provision:  Detailed education and instructions provided on heart failure disease management including the following:  Signs and symptoms of Heart Failure When to call the physician Importance of daily weights Low sodium diet Fluid restriction Medication management Anticipated future follow-up appointments  Patient education given on each of the above topics.  Patient acknowledges understanding via teach back method and acceptance of  all instructions.  Education Materials:  "Living Better With Heart Failure" Booklet, HF zone tool, & Daily Weight Tracker Tool.  Patient has scale at home: yes Patient has pill box at home: yes    High Risk Criteria for Readmission and/or Poor Patient Outcomes: Heart failure hospital admissions (last 6 months): 0  No Show rate: 5 % Difficult social situation: No Demonstrates medication adherence: Yes Primary Language: English Literacy level: Some reading and writing.   Barriers of Care:   Diet/ fluid restrictions ( eats out a lot ) church friends cook for him too.  Daily weights (weighs himself  3-4 times per day)   Considerations/Referrals:   Referral made to Heart Failure Pharmacist Stewardship: No Referral made to Heart Failure CSW/NCM TOC: No Referral made to Heart & Vascular TOC clinic: Yes, 7/17/223 @ 12 noon   Items for Follow-up on DC/TOC: Diet fluid restrictions ( eats out a lot, [employer]) Daily weights ( weighs himself 3-4 times per day)   Earnestine Leys, BSN, RN Heart Failure Transport planner Only

## 2022-04-05 NOTE — Evaluation (Signed)
Physical Therapy Evaluation Patient Details Name: Steven Rosario MRN: [id number] DOB: 1929-04-22 Today's Date: 04/05/2022  History of Present Illness  88 y.o. male presents to [facility] on [facility]/03/2022 with SOB. Pt admitted with acute CHF. CXR notable for interstitial edema and B pleural effusions. PMHx includes: HTN, CKD stage IV, paroxysmal afib, and CAD.  Clinical Impression  Pt tolerates therapy well today, ambulating limited community distances with a rollator independently. Pt reports that mobilization today feels similar to baseline. PT does not recommend any continued PT services. PT encourages Pt to mobilize as frequently as possible to maintain his current level of function.     Recommendations for follow up therapy are one component of a multi-disciplinary discharge planning process, led by the attending physician.  Recommendations may be updated based on patient status, additional functional criteria and insurance authorization.  Follow Up Recommendations No PT follow up      Assistance Recommended at Discharge PRN  Patient can return home with the following  Other (comment) (Assistance PRN; Pt reports doing his ADLs independently)    Equipment Recommendations None recommended by PT  Recommendations for Other Services       Functional Status Assessment Patient has not had a recent decline in their functional status     Precautions / Restrictions Precautions Precautions: Fall Restrictions Weight Bearing Restrictions: No      Mobility  Bed Mobility                    Transfers Overall transfer level: Modified independent Equipment used: Rollator (4 wheels) Transfers: Sit to/from Stand Sit to Stand: Modified independent (Device/Increase time)                Ambulation/Gait Ambulation/Gait assistance: Modified independent (Device/Increase time) Gait Distance (Feet): 550 Feet Assistive device: Rollator (4 wheels) Gait Pattern/deviations: WFL(Within  Functional Limits) Gait velocity: WFL Gait velocity interpretation: >2.62 ft/sec, indicative of community ambulatory   General Gait Details: PT manages lines and leads during ambulation; no other assistance necessary  Stairs            Wheelchair Mobility    Modified Rankin (Stroke Patients Only)       Balance Overall balance assessment: Modified Independent Sitting-balance support: No upper extremity supported, Feet supported Sitting balance-Leahy Scale: Good     Standing balance support: Bilateral upper extremity supported, During functional activity, Reliant on assistive device for balance Standing balance-Leahy Scale: Poor                               Pertinent Vitals/Pain Pain Assessment Pain Assessment: No/denies pain    Home Living Family/patient expects to be discharged to:: Private residence Living Arrangements: Alone Available Help at Discharge: Family;Neighbor;Available PRN/intermittently Type of Home: House Home Access: Stairs to enter Entrance Stairs-Rails: Chemical engineer of Steps: 5   Home Layout: One level Home Equipment: Rollator (4 wheels);Shower seat;Cane - quad;Grab bars - toilet;Grab bars - tub/shower Additional Comments: Pt reports having 3 rollators; one in home, one outside, one in car    Prior Function Prior Level of Function : Independent/Modified Independent;Driving (Pt reports trouble with driving recently due to weak plantar/dorsiflexion)             Mobility Comments: Ambulates with rollator ADLs Comments: Patient reports he is independent with ADLs/IADLs but has help available if needed     Hand Dominance        Extremity/Trunk Assessment  Upper Extremity Assessment Upper Extremity Assessment: Generalized weakness    Lower Extremity Assessment Lower Extremity Assessment: Generalized weakness    Cervical / Trunk Assessment Cervical / Trunk Assessment: Kyphotic  Communication    Communication: No difficulties (Pt reports he doesn't have his hearing aids currently so somewhat HOH)  Cognition Arousal/Alertness: Awake/alert Behavior During Therapy: WFL for tasks assessed/performed Overall Cognitive Status: No family/caregiver present to determine baseline cognitive functioning (Pt reports his memory isn't what it used to be and takes increased time to answer questions intermittently)                                          General Comments General comments (skin integrity, edema, etc.): VSS on RA; Pt reports that he doesn't use O2 at home    Exercises     Assessment/Plan    PT Assessment Patient does not need any further PT services  PT Problem List         PT Treatment Interventions      PT Goals (Current goals can be found in the Care Plan section)  Acute Rehab PT Goals Patient Stated Goal: Return home and "would love to walk better" PT Goal Formulation: With patient Time For Goal Achievement: 04/19/22 Potential to Achieve Goals: Good    Frequency       Co-evaluation               AM-PAC PT "6 Clicks" Mobility  Outcome Measure Help needed turning from your back to your side while in a flat bed without using bedrails?: None Help needed moving from lying on your back to sitting on the side of a flat bed without using bedrails?: None Help needed moving to and from a bed to a chair (including a wheelchair)?: None Help needed standing up from a chair using your arms (e.g., wheelchair or bedside chair)?: None Help needed to walk in hospital room?: None Help needed climbing 3-5 steps with a railing? : None 6 Click Score: 24    End of Session Equipment Utilized During Treatment: Gait belt Activity Tolerance: Patient tolerated treatment well Patient left: in chair;with call bell/phone within reach;with chair alarm set Nurse Communication: Mobility status PT Visit Diagnosis: History of falling (Z91.81);Muscle weakness  (generalized) (M62.81)    Time: 2409-7353 PT Time Calculation (min) (ACUTE ONLY): 38 min   Charges:   PT Evaluation $PT Eval Low Complexity: 1 Low          Hall Busing, SPT Acute Rehabilitation Office #: [phone]   Hall Busing 04/05/2022, 11:09 AM

## 2022-04-05 NOTE — Progress Notes (Signed)
[facility] PROGRESS NOTE   Keefer Soulliere RCV:[id number] DOB: May 15, 1929 DOA: 04/03/2022  PCP: Angelina Sheriff, MD  Brief History/Interval Summary: 88 yo male with the past medical history of atrial fibrillation, sick sinus syndrome s/p pacemaker implantation, hypertension, CKD stage IV, and coronary artery disease who presented with dyspnea.  There was concern for fluid overload.  Patient was given IV furosemide and was hospitalized for further management.  He was also noted to be hypoxic initially with saturations of 86% on room air.  Consultants: None  Procedures: Transthoracic echocardiogram    Subjective/Interval History: Patient mentions that his shortness of breath is significantly better.  Denies any chest pain nausea or vomiting.  Concerned about lower extremity edema when he is out of the bed.     Assessment/Plan:  Acute combined systolic and diastolic CHF  Echocardiogram shows EF to be 35 to 40%.  Grade 2 diastolic dysfunction also noted.  Patient has improved with IV furosemide.  Strict ins and outs and daily weights.  No ACE inhibitor or ARB or Entresto due to chronic kidney disease.   Patient has been on Bumex in the outpatient setting. Continue IV furosemide for today.  Changed to once a day.  Will transition to oral furosemide tomorrow.  Chest x-ray was repeated this morning and shows improvement in CHF.  Patient has diuresed well.  Weight has decreased from 87.4 kg to 82.8 kg. Patient is followed by electrophysiology for his pacemaker. He is followed by Dr. Agustin Cree in [place] as well.  Since patient has improved we can defer further evaluation to the outpatient setting.  We will try to get him an appointment with cardiology at discharge.  Atrial fibrillation/sick sinus syndrome/history of complete heart block Patient is status post pacemaker.  Heart rate noted to be stable.  Patient is on amiodarone and apixaban which is being continued.  Essential  hypertension Continue amlodipine.  Noted to be on hydralazine.  Elevated blood pressure readings noted.  Consider adjusting his antihypertensives. Amlodipine could also be contributing to his lower extremity edema.  Dyslipidemia Continue with statin therapy.    Acute kidney injury superimposed on chronic kidney disease stage IV Noted to have a creatinine of 2.5 in April 2022, creatinine 1.93 in July 2022.  Came in with creatinine of 2.512.   Rising creatinine noted today.  Likely due to diuretics.  Has very good urine output.  Anticipate some worsening of his renal function which may have to be tolerated in this patient with CHF.  Avoid nephrotoxic agents.     Coronary artery disease No angina, no signs of acute coronary syndrome.   Normocytic anemia No evidence of overt bleeding.  Likely anemia of chronic kidney disease.   DVT Prophylaxis: On apixaban Code Status: DNR Family Communication: Discussed with patient.  No family at bedside. Disposition Plan: Patient mentioned that he lives by himself.  Seen by OT.  Await PT evaluation.  Status is: Inpatient Remains inpatient appropriate because: IV diuresis for presumed congestive heart failure      Medications: Scheduled:  amiodarone  200 mg Oral Daily   amLODipine  5 mg Oral Daily   apixaban  2.5 mg Oral BID   atorvastatin  20 mg Oral Daily   furosemide  60 mg Intravenous Q12H   hydrALAZINE  50 mg Oral BID   pantoprazole  20 mg Oral Daily   sodium chloride flush  3 mL Intravenous Q12H   Continuous: ZWC:HENIDPOEUMPNT **OR** acetaminophen, alum & mag hydroxide-simeth, senna  Antibiotics: Anti-infectives (From admission, onward)    None       Objective:  Vital Signs  Vitals:   04/05/22 0040 04/05/22 0448 04/05/22 0621 04/05/22 0728  BP: (!) 143/74 138/76  (!) 157/87  Pulse: 65 63  66  Resp:  18  18  Temp: 97.8 F (36.6 C) 97.6 F (36.4 C)    TempSrc: Oral Oral    SpO2: 94% 91%  98%  Weight:   82.8 kg    Height:        Intake/Output Summary (Last 24 hours) at 04/05/2022 1034 Last data filed at 04/05/2022 0919 Gross per 24 hour  Intake 663 ml  Output 4900 ml  Net -4237 ml    Filed Weights   04/03/22 1330 04/04/22 0419 04/05/22 0621  Weight: 87.4 kg 86.5 kg 82.8 kg    General appearance: Awake alert.  In no distress Resp: Improved aeration bilaterally.  Improved effort.  Few crackles at the bases.  Improved from yesterday Cardio: S1-S2 is normal regular.  No S3-S4.  No rubs murmurs or bruit GI: Abdomen is soft.  Nontender nondistended.  Bowel sounds are present normal.  No masses organomegaly Extremities: No edema.  Full range of motion of lower extremities. Neurologic: Alert and oriented x3.  No focal neurological deficits.     Lab Results:  Data Reviewed: I have personally reviewed following labs and reports of the imaging studies  CBC: Recent Labs  Lab 04/03/22 0253 04/04/22 0303  WBC 8.8 8.1  NEUTROABS 6.6  --   HGB 11.8* 11.2*  HCT 36.9* 35.0*  MCV 98.1 95.1  PLT 194 195     Basic Metabolic Panel: Recent Labs  Lab 04/03/22 0253 04/04/22 0303 04/05/22 0033  NA 143 142 140  K 4.6 4.0 3.8  CL 110 106 101  CO2 21* 26 26  GLUCOSE 169* 121* 100*  BUN 36* 39* 39*  CREATININE 2.52* 2.47* 2.70*  CALCIUM 8.8* 8.6* 8.8*     GFR: Estimated Creatinine Clearance: 18.2 mL/min (A) (by C-G formula based on SCr of 2.7 mg/dL (H)).     Recent Results (from the past 240 hour(s))  SARS Coronavirus 2 by RT PCR (hospital order, performed in [facility] hospital lab) *cepheid single result test* Anterior Nasal Swab     Status: None   Collection Time: 04/03/22  6:09 AM   Specimen: Anterior Nasal Swab  Result Value Ref Range Status   SARS Coronavirus 2 by RT PCR NEGATIVE NEGATIVE Final    Comment: (NOTE) SARS-CoV-2 target nucleic acids are NOT DETECTED.  The SARS-CoV-2 RNA is generally detectable in upper and lower respiratory specimens during the acute phase of  infection. The lowest concentration of SARS-CoV-2 viral copies this assay can detect is 250 copies / mL. A negative result does not preclude SARS-CoV-2 infection and should not be used as the sole basis for treatment or other patient management decisions.  A negative result may occur with improper specimen collection / handling, submission of specimen other than nasopharyngeal swab, presence of viral mutation(s) within the areas targeted by this assay, and inadequate number of viral copies (<250 copies / mL). A negative result must be combined with clinical observations, patient history, and epidemiological information.  Fact Sheet for Patients:   https://www.patel.info/  Fact Sheet for Healthcare Providers: https://hall.com/  This test is not yet approved or  cleared by the [place] FDA and has been authorized for detection and/or diagnosis of SARS-CoV-2 by FDA under an Emergency Use  Authorization (EUA).  This EUA will remain in effect (meaning this test can be used) for the duration of the COVID-19 declaration under Section 564(b)(1) of the Act, 21 U.S.C. section 360bbb-3(b)(1), unless the authorization is terminated or revoked sooner.  Performed at [facility], [place] [address] [place], [place] [address]       Radiology Studies: DG CHEST PORT 1 VIEW  Result Date: 04/05/2022 CLINICAL DATA:  Shortness of breath EXAM: PORTABLE CHEST 1 VIEW COMPARISON:  Two days ago FINDINGS: Improved inflation. Hazy density at the bases usually from atelectasis and pleural fluid, improved. Some vascular congestion is still noted. No pneumothorax. Stable heart size and mediastinal contours. Dual-chamber pacer leads from the left. IMPRESSION: Improving CHF and inflation. Electronically Signed   By: Jorje Guild M.D.   On: 04/05/2022 10:25   ECHOCARDIOGRAM COMPLETE  Result Date: 04/04/2022    ECHOCARDIOGRAM REPORT   Patient Name:   Steven Rosario  Date of Exam: 04/04/2022 Medical Rec #:  [id number]     Height:       69.0 in Accession #:    [id number]    Weight:       190.7 lb Date of Birth:  12-03-1928     BSA:          2.025 m Patient Age:    13 years      BP:           169/67 mmHg Patient Gender: M             HR:           60 bpm. Exam Location:  Inpatient Procedure: 2D Echo, Cardiac Doppler, Color Doppler and Intracardiac            Opacification Agent Indications:    R06.02 SOB  History:        Patient has no prior history of Echocardiogram examinations.                 CHF, CAD, Abnormal ECG and Pacemaker, Arrythmias:Atrial                 Fibrillation, Signs/Symptoms:Shortness of Breath and Dyspnea;                 Risk Factors:Hypertension and Dyslipidemia.  Sonographer:    Roseanna Rainbow RDCS Referring Phys: Ilene Qua OPYD  Sonographer Comments: Technically difficult study due to poor echo windows. Image acquisition challenging due to respiratory motion. IMPRESSIONS  1. Left ventricular ejection fraction, by estimation, is 35 to 40%. The left ventricle has moderately decreased function. The left ventricle demonstrates global hypokinesis. There is mild concentric left ventricular hypertrophy. Left ventricular diastolic parameters are consistent with Grade II diastolic dysfunction (pseudonormalization).  2. Right ventricular systolic function is normal. The right ventricular size is normal. There is normal pulmonary artery systolic pressure.  3. The mitral valve is normal in structure. Mild mitral valve regurgitation. No evidence of mitral stenosis.  4. The aortic valve has an indeterminant number of cusps. Aortic valve regurgitation is not visualized. Aortic valve sclerosis/calcification is present, without any evidence of aortic stenosis.  5. The inferior vena cava is normal in size with greater than 50% respiratory variability, suggesting right atrial pressure of 3 mmHg. Comparison(s): No prior Echocardiogram. FINDINGS  Left Ventricle: Left ventricular  ejection fraction, by estimation, is 35 to 40%. The left ventricle has moderately decreased function. The left ventricle demonstrates global hypokinesis. Definity contrast agent was given IV to delineate the left ventricular  endocardial borders. The left ventricular internal cavity size was normal in size. There is mild concentric left ventricular hypertrophy. Left ventricular diastolic parameters are consistent with Grade II diastolic dysfunction (pseudonormalization). Right Ventricle: The right ventricular size is normal. No increase in right ventricular wall thickness. Right ventricular systolic function is normal. There is normal pulmonary artery systolic pressure. The tricuspid regurgitant velocity is 1.86 m/s, and  with an assumed right atrial pressure of 3 mmHg, the estimated right ventricular systolic pressure is 51.0 mmHg. Left Atrium: Left atrial size was normal in size. Right Atrium: Device lead seen. Right atrial size was normal in size. Pericardium: There is no evidence of pericardial effusion. Presence of epicardial fat layer. Mitral Valve: The mitral valve is normal in structure. Mild mitral annular calcification. Mild mitral valve regurgitation. No evidence of mitral valve stenosis. Tricuspid Valve: The tricuspid valve is normal in structure. Tricuspid valve regurgitation is not demonstrated. No evidence of tricuspid stenosis. Aortic Valve: The aortic valve has an indeterminant number of cusps. Aortic valve regurgitation is not visualized. Aortic valve sclerosis/calcification is present, without any evidence of aortic stenosis. Pulmonic Valve: The pulmonic valve was normal in structure. Pulmonic valve regurgitation is not visualized. No evidence of pulmonic stenosis. Aorta: The aortic root is normal in size and structure. Venous: The inferior vena cava is normal in size with greater than 50% respiratory variability, suggesting right atrial pressure of 3 mmHg. IAS/Shunts: No atrial level shunt  detected by color flow Doppler. Additional Comments: A device lead is visualized.  LEFT VENTRICLE PLAX 2D LVIDd:         4.80 cm     Diastology LVIDs:         3.70 cm     LV e' medial:    3.08 cm/s LV PW:         1.25 cm     LV E/e' medial:  23.7 LV IVS:        1.20 cm     LV e' lateral:   5.33 cm/s LVOT diam:     2.30 cm     LV E/e' lateral: 13.7 LV SV:         67 LV SV Index:   33 LVOT Area:     4.15 cm  LV Volumes (MOD) LV vol d, MOD A2C: 90.8 ml LV vol d, MOD A4C: 96.8 ml LV vol s, MOD A2C: 74.1 ml LV vol s, MOD A4C: 55.7 ml LV SV MOD A2C:     16.7 ml LV SV MOD A4C:     96.8 ml LV SV MOD BP:      27.2 ml RIGHT VENTRICLE            IVC RV S prime:     8.40 cm/s  IVC diam: 1.70 cm TAPSE (M-mode): 1.3 cm LEFT ATRIUM             Index        RIGHT ATRIUM           Index LA diam:        4.30 cm 2.12 cm/m   RA Area:     17.30 cm LA Vol (A2C):   31.2 ml 15.41 ml/m  RA Volume:   44.30 ml  21.88 ml/m LA Vol (A4C):   63.0 ml 31.12 ml/m LA Biplane Vol: 48.8 ml 24.10 ml/m  AORTIC VALVE LVOT Vmax:   86.90 cm/s LVOT Vmean:  53.900 cm/s LVOT VTI:    0.162 m  AORTA  Ao Root diam: 3.10 cm MITRAL VALVE               TRICUSPID VALVE MV Area (PHT): 3.27 cm    TR Peak grad:   13.8 mmHg MV Decel Time: 232 msec    TR Vmax:        186.00 cm/s MV E velocity: 72.90 cm/s MV A velocity: 29.60 cm/s  SHUNTS MV E/A ratio:  2.46        Systemic VTI:  0.16 m                            Systemic Diam: 2.30 cm Kardie Tobb DO Electronically signed by Berniece Salines DO Signature Date/Time: 04/04/2022/11:47:27 AM    Final        LOS: 2 days   [facility] Pager on [email or web address]  04/05/2022, 10:34 AM

## 2022-04-06 DIAGNOSIS — I5043 Acute on chronic combined systolic (congestive) and diastolic (congestive) heart failure: Secondary | ICD-10-CM | POA: Diagnosis not present

## 2022-04-06 LAB — BASIC METABOLIC PANEL
Anion gap: 9 (ref 5–15)
BUN: 46 mg/dL — ABNORMAL HIGH (ref 8–23)
CO2: 27 mmol/L (ref 22–32)
Calcium: 8.4 mg/dL — ABNORMAL LOW (ref 8.9–10.3)
Chloride: 102 mmol/L (ref 98–111)
Creatinine, Ser: 2.74 mg/dL — ABNORMAL HIGH (ref 0.61–1.24)
GFR, Estimated: 21 mL/min — ABNORMAL LOW (ref >60)
Glucose, Bld: 101 mg/dL — ABNORMAL HIGH (ref 70–99)
Potassium: 3.9 mmol/L (ref 3.5–5.1)
Sodium: 138 mmol/L (ref 135–145)

## 2022-04-06 MED ORDER — ISOSORBIDE DINITRATE 10 MG PO TABS
10.0000 mg | ORAL_TABLET | Freq: Three times a day (TID) | ORAL | Status: DC
Start: 2022-04-06 — End: 2022-04-06
  Administered 2022-04-06: 10 mg via ORAL
  Filled 2022-04-06: qty 1

## 2022-04-06 MED ORDER — HYDRALAZINE HCL 50 MG PO TABS
50.0000 mg | ORAL_TABLET | Freq: Three times a day (TID) | ORAL | Status: DC
  Administered 2022-04-06: 50 mg via ORAL
  Filled 2022-04-06: qty 1

## 2022-04-06 MED ORDER — HYDRALAZINE HCL 25 MG PO TABS: 37.5000 mg | ORAL_TABLET | Freq: Three times a day (TID) | ORAL | 2 refills | Status: DC

## 2022-04-06 MED ORDER — FUROSEMIDE 40 MG PO TABS: 80.0000 mg | ORAL_TABLET | Freq: Every day | ORAL | 2 refills | Status: DC

## 2022-04-06 MED ORDER — ISOSORBIDE DINITRATE 20 MG PO TABS: 20.0000 mg | ORAL_TABLET | Freq: Three times a day (TID) | ORAL | 2 refills | Status: DC

## 2022-04-06 MED ORDER — GUAIFENESIN 100 MG/5ML PO LIQD: 5.0000 mL | ORAL | 0 refills | Status: DC | PRN

## 2022-04-06 NOTE — Progress Notes (Signed)
Occupational Therapy Treatment Patient Details Name: Steven Rosario MRN: [id number] DOB: 09-21-1929 Today's Date: 04/06/2022   History of present illness 88 y.o. male [facility] on [facility]/2022 with SOB. Pt admitted with acute CHF. CXR notable for interstitial edema and B pleural effusions. PMHx includes: HTN, CKD stage IV, paroxysmal afib, and CAD.   OT comments  Patient received in bed and agreeable to OT session. Patient able to get to EOB with and ambulated to sink for grooming tasks with rollator walker. Patient performed functional mobility in hallway with rollator with min guard assist for safety. Patient is expected to discharge home today.    Recommendations for follow up therapy are one component of a multi-disciplinary discharge planning process, led by the attending physician.  Recommendations may be updated based on patient status, additional functional criteria and insurance authorization.    Follow Up Recommendations  No OT follow up    Assistance Recommended at Discharge Intermittent Supervision/Assistance  Patient can return home with the following  Assist for transportation   Equipment Recommendations  None recommended by OT    Recommendations for Other Services      Precautions / Restrictions Precautions Precautions: Fall Restrictions Weight Bearing Restrictions: No       Mobility Bed Mobility Overal bed mobility: Needs Assistance Bed Mobility: Supine to Sit, Sit to Supine     Supine to sit: Min guard Sit to supine: Supervision   General bed mobility comments: no physical assistance needed    Transfers Overall transfer level: Modified independent Equipment used: Rollator (4 wheels) Transfers: Sit to/from Stand Sit to Stand: Modified independent (Device/Increase time)           General transfer comment: stood from EOB and ambulated to sink for grooming and then performed functional mobility in hallway before resting at EOB     Balance  Overall balance assessment: Modified Independent Sitting-balance support: No upper extremity supported, Feet supported Sitting balance-Leahy Scale: Good     Standing balance support: Bilateral upper extremity supported, During functional activity, Reliant on assistive device for balance Standing balance-Leahy Scale: Poor                             ADL either performed or assessed with clinical judgement   ADL Overall ADL's : Needs assistance/impaired     Grooming: Wash/dry hands;Wash/dry face;Oral care;Brushing hair;Min guard;Standing Grooming Details (indicate cue type and reason): at sink                               General ADL Comments: min guard for safety when standing at sink    Extremity/Trunk Assessment              Vision       Perception     Praxis      Cognition Arousal/Alertness: Awake/alert Behavior During Therapy: WFL for tasks assessed/performed Overall Cognitive Status: No family/caregiver present to determine baseline cognitive functioning                                 General Comments: Very tangential thought process and conversation, follows commands        Exercises      Shoulder Instructions       General Comments      Pertinent Vitals/ Pain  Pain Assessment Pain Assessment: No/denies pain  Home Living                                          Prior Functioning/Environment              Frequency  Min 2X/week        Progress Toward Goals  OT Goals(current goals can now be found in the care plan section)  Progress towards OT goals: Progressing toward goals  Acute Rehab OT Goals Patient Stated Goal: go home OT Goal Formulation: With patient Time For Goal Achievement: 04/18/22 Potential to Achieve Goals: Good ADL Goals Pt Will Perform Grooming: with modified independence;sitting;standing Pt Will Perform Lower Body Dressing: with modified  independence;sit to/from stand Pt Will Transfer to Toilet: with modified independence;regular height toilet;ambulating  Plan Discharge plan remains appropriate    Co-evaluation                 AM-PAC OT "6 Clicks" Daily Activity     Outcome Measure   Help from another person eating meals?: None Help from another person taking care of personal grooming?: None Help from another person toileting, which includes using toliet, bedpan, or urinal?: A Little Help from another person bathing (including washing, rinsing, drying)?: A Little Help from another person to put on and taking off regular upper body clothing?: None Help from another person to put on and taking off regular lower body clothing?: A Little 6 Click Score: 21    End of Session Equipment Utilized During Treatment: Rollator (4 wheels)  OT Visit Diagnosis: Unsteadiness on feet (R26.81);Other symptoms and signs involving cognitive function   Activity Tolerance Patient tolerated treatment well   Patient Left in bed;with call bell/phone within reach;with nursing/sitter in room   Nurse Communication Mobility status        Time: 0973-5329 OT Time Calculation (min): 30 min  Charges: OT General Charges $OT Visit: 1 Visit OT Treatments $Self Care/Home Management : 8-22 mins $Therapeutic Activity: 8-22 mins  Lodema Hong, [place]  Office [phone]   Trixie Dredge 04/06/2022, 9:52 AM

## 2022-04-06 NOTE — Progress Notes (Signed)
Patient appropriate for discharge and is alert and oriented x 4. Educated on new blood pressure medications and appointments.

## 2022-04-06 NOTE — Discharge Summary (Signed)
[facility]  Physician Discharge Summary   Patient ID: Steven Rosario MRN: [id number] DOB/AGE: 11/30/1928 88 y.o.  Admit date: 04/03/2022 Discharge date: 04/06/2022    PCP: Angelina Sheriff, MD  DISCHARGE DIAGNOSES:  Principal Problem:   Acute on chronic combined systolic and diastolic CHF (congestive heart failure) ([place]) Active Problems:   Essential hypertension, benign    Coronary artery disease   RECOMMENDATIONS FOR OUTPATIENT FOLLOW UP: Patient has been made appointments with cardiology    Home Health: None Equipment/Devices: None  CODE STATUS: DNR  DISCHARGE CONDITION: fair  Diet recommendation: As before  INITIAL HISTORY: 88 yo male with the past medical history of atrial fibrillation, sick sinus syndrome s/p pacemaker implantation, hypertension, CKD stage IV, and coronary artery disease who presented with dyspnea.  There was concern for fluid overload.  Patient was given IV furosemide and was hospitalized for further management.  He was also noted to be hypoxic initially with saturations of 86% on room air.   Consultants: None   Procedures: Transthoracic echocardiogram   HOSPITAL COURSE:   Acute combined systolic and diastolic CHF  Echocardiogram shows EF to be 35 to 40%.  Grade 2 diastolic dysfunction also noted.  Patient has improved with IV furosemide.  Continue carvedilol.  No ACE inhibitor or ARB or Entresto due to chronic kidney disease.  Started on hydralazine and nitrates for Patient has been on Bumex in the outpatient setting.  Has been switched over to oral furosemide for now. Patient was placed on IV diuretics with improvement.  Renal function remained stable for the most part. Chest x-ray was repeated and shows improvement in CHF.  Patient has diuresed well.  Weight has decreased from 87.4 kg to 82.8 kg. Patient is followed by electrophysiology for his pacemaker. He is followed by Dr. Agustin Cree in [place] as well.  Since patient has  improved we can defer further evaluation to the outpatient setting.  He has been made follow-up appointments.   Atrial fibrillation/sick sinus syndrome/history of complete heart block Patient is status post pacemaker.  Patient is on amiodarone and apixaban which is being continued.  Essential hypertension Continue home medications including amlodipine and hydralazine.  Nitrates has been added due to CHF.  Dyslipidemia Continue with statin therapy.    Acute kidney injury superimposed on chronic kidney disease stage IV Noted to have a creatinine of 2.5 in April 2022, creatinine 1.93 in July 2022.  Came in with creatinine of 2.512.  Renal function stable for the most part.  Will need to tolerate a high level of creatinine in this patient with CHF.   Coronary artery disease No angina, no signs of acute coronary syndrome.    Normocytic anemia No evidence of overt bleeding.  Likely anemia of chronic kidney disease.    Patient is stable.  Okay for discharge home today.   PERTINENT LABS:  The results of significant diagnostics from this hospitalization (including imaging, microbiology, ancillary and laboratory) are listed below for reference.    Microbiology: Recent Results (from the past 240 hour(s))  SARS Coronavirus 2 by RT PCR (hospital order, performed in [facility] hospital lab) *cepheid single result test* Anterior Nasal Swab     Status: None   Collection Time: 04/03/22  6:09 AM   Specimen: Anterior Nasal Swab  Result Value Ref Range Status   SARS Coronavirus 2 by RT PCR NEGATIVE NEGATIVE Final    Comment: (NOTE) SARS-CoV-2 target nucleic acids are NOT DETECTED.  The SARS-CoV-2 RNA is generally detectable in  upper and lower respiratory specimens during the acute phase of infection. The lowest concentration of SARS-CoV-2 viral copies this assay can detect is 250 copies / mL. A negative result does not preclude SARS-CoV-2 infection and should not be used as the sole basis for  treatment or other patient management decisions.  A negative result may occur with improper specimen collection / handling, submission of specimen other than nasopharyngeal swab, presence of viral mutation(s) within the areas targeted by this assay, and inadequate number of viral copies (<250 copies / mL). A negative result must be combined with clinical observations, patient history, and epidemiological information.  Fact Sheet for Patients:   https://www.patel.info/  Fact Sheet for Healthcare Providers: https://hall.com/  This test is not yet approved or  cleared by the [place] FDA and has been authorized for detection and/or diagnosis of SARS-CoV-2 by FDA under an Emergency Use Authorization (EUA).  This EUA will remain in effect (meaning this test can be used) for the duration of the COVID-19 declaration under Section 564(b)(1) of the Act, 21 U.S.C. section 360bbb-3(b)(1), unless the authorization is terminated or revoked sooner.  Performed at [facility], [place] [address], [place], [place] [address]      Labs:   Basic Metabolic Panel: Recent Labs  Lab 04/03/22 0253 04/04/22 0303 04/05/22 0033 04/06/22 0152  NA 143 142 140 138  K 4.6 4.0 3.8 3.9  CL 110 106 101 102  CO2 21* '26 26 27  '$ GLUCOSE 169* 121* 100* 101*  BUN 36* 39* 39* 46*  CREATININE 2.52* 2.47* 2.70* 2.74*  CALCIUM 8.8* 8.6* 8.8* 8.4*    CBC: Recent Labs  Lab 04/03/22 0253 04/04/22 0303  WBC 8.8 8.1  NEUTROABS 6.6  --   HGB 11.8* 11.2*  HCT 36.9* 35.0*  MCV 98.1 95.1  PLT 194 195    BNP: BNP (last 3 results) Recent Labs    04/03/22 0438  BNP 1,524.2*     IMAGING STUDIES DG CHEST PORT 1 VIEW  Result Date: 04/05/2022 CLINICAL DATA:  Shortness of breath EXAM: PORTABLE CHEST 1 VIEW COMPARISON:  Two days ago FINDINGS: Improved inflation. Hazy density at the bases usually from atelectasis and pleural fluid, improved. Some vascular  congestion is still noted. No pneumothorax. Stable heart size and mediastinal contours. Dual-chamber pacer leads from the left. IMPRESSION: Improving CHF and inflation. Electronically Signed   By: Jorje Guild M.D.   On: 04/05/2022 10:25   ECHOCARDIOGRAM COMPLETE  Result Date: 04/04/2022    ECHOCARDIOGRAM REPORT   Patient Name:   Rodgers Likes Date of Exam: 04/04/2022 Medical Rec #:  [id number]     Height:       69.0 in Accession #:    [id number]    Weight:       190.7 lb Date of Birth:  [birth year]     BSA:          2.025 m Patient Age:    74 years      BP:           169/67 mmHg Patient Gender: M             HR:           60 bpm. Exam Location:  Inpatient Procedure: 2D Echo, Cardiac Doppler, Color Doppler and Intracardiac            Opacification Agent Indications:    R06.02 SOB  History:        Patient has no prior history  of Echocardiogram examinations.                 CHF, CAD, Abnormal ECG and Pacemaker, Arrythmias:Atrial                 Fibrillation, Signs/Symptoms:Shortness of Breath and Dyspnea;                 Risk Factors:Hypertension and Dyslipidemia.  Sonographer:    Roseanna Rainbow RDCS Referring Phys: Ilene Qua OPYD  Sonographer Comments: Technically difficult study due to poor echo windows. Image acquisition challenging due to respiratory motion. IMPRESSIONS  1. Left ventricular ejection fraction, by estimation, is 35 to 40%. The left ventricle has moderately decreased function. The left ventricle demonstrates global hypokinesis. There is mild concentric left ventricular hypertrophy. Left ventricular diastolic parameters are consistent with Grade II diastolic dysfunction (pseudonormalization).  2. Right ventricular systolic function is normal. The right ventricular size is normal. There is normal pulmonary artery systolic pressure.  3. The mitral valve is normal in structure. Mild mitral valve regurgitation. No evidence of mitral stenosis.  4. The aortic valve has an indeterminant number of cusps. Aortic  valve regurgitation is not visualized. Aortic valve sclerosis/calcification is present, without any evidence of aortic stenosis.  5. The inferior vena cava is normal in size with greater than 50% respiratory variability, suggesting right atrial pressure of 3 mmHg. Comparison(s): No prior Echocardiogram. FINDINGS  Left Ventricle: Left ventricular ejection fraction, by estimation, is 35 to 40%. The left ventricle has moderately decreased function. The left ventricle demonstrates global hypokinesis. Definity contrast agent was given IV to delineate the left ventricular endocardial borders. The left ventricular internal cavity size was normal in size. There is mild concentric left ventricular hypertrophy. Left ventricular diastolic parameters are consistent with Grade II diastolic dysfunction (pseudonormalization). Right Ventricle: The right ventricular size is normal. No increase in right ventricular wall thickness. Right ventricular systolic function is normal. There is normal pulmonary artery systolic pressure. The tricuspid regurgitant velocity is 1.86 m/s, and  with an assumed right atrial pressure of 3 mmHg, the estimated right ventricular systolic pressure is 40.9 mmHg. Left Atrium: Left atrial size was normal in size. Right Atrium: Device lead seen. Right atrial size was normal in size. Pericardium: There is no evidence of pericardial effusion. Presence of epicardial fat layer. Mitral Valve: The mitral valve is normal in structure. Mild mitral annular calcification. Mild mitral valve regurgitation. No evidence of mitral valve stenosis. Tricuspid Valve: The tricuspid valve is normal in structure. Tricuspid valve regurgitation is not demonstrated. No evidence of tricuspid stenosis. Aortic Valve: The aortic valve has an indeterminant number of cusps. Aortic valve regurgitation is not visualized. Aortic valve sclerosis/calcification is present, without any evidence of aortic stenosis. Pulmonic Valve: The pulmonic  valve was normal in structure. Pulmonic valve regurgitation is not visualized. No evidence of pulmonic stenosis. Aorta: The aortic root is normal in size and structure. Venous: The inferior vena cava is normal in size with greater than 50% respiratory variability, suggesting right atrial pressure of 3 mmHg. IAS/Shunts: No atrial level shunt detected by color flow Doppler. Additional Comments: A device lead is visualized.  LEFT VENTRICLE PLAX 2D LVIDd:         4.80 cm     Diastology LVIDs:         3.70 cm     LV e' medial:    3.08 cm/s LV PW:         1.25 cm     LV E/e'  medial:  23.7 LV IVS:        1.20 cm     LV e' lateral:   5.33 cm/s LVOT diam:     2.30 cm     LV E/e' lateral: 13.7 LV SV:         67 LV SV Index:   33 LVOT Area:     4.15 cm  LV Volumes (MOD) LV vol d, MOD A2C: 90.8 ml LV vol d, MOD A4C: 96.8 ml LV vol s, MOD A2C: 74.1 ml LV vol s, MOD A4C: 55.7 ml LV SV MOD A2C:     16.7 ml LV SV MOD A4C:     96.8 ml LV SV MOD BP:      27.2 ml RIGHT VENTRICLE            IVC RV S prime:     8.40 cm/s  IVC diam: 1.70 cm TAPSE (M-mode): 1.3 cm LEFT ATRIUM             Index        RIGHT ATRIUM           Index LA diam:        4.30 cm 2.12 cm/m   RA Area:     17.30 cm LA Vol (A2C):   31.2 ml 15.41 ml/m  RA Volume:   44.30 ml  21.88 ml/m LA Vol (A4C):   63.0 ml 31.12 ml/m LA Biplane Vol: 48.8 ml 24.10 ml/m  AORTIC VALVE LVOT Vmax:   86.90 cm/s LVOT Vmean:  53.900 cm/s LVOT VTI:    0.162 m  AORTA Ao Root diam: 3.10 cm MITRAL VALVE               TRICUSPID VALVE MV Area (PHT): 3.27 cm    TR Peak grad:   13.8 mmHg MV Decel Time: 232 msec    TR Vmax:        186.00 cm/s MV E velocity: 72.90 cm/s MV A velocity: 29.60 cm/s  SHUNTS MV E/A ratio:  2.46        Systemic VTI:  0.16 m                            Systemic Diam: 2.30 cm Kardie Tobb DO Electronically signed by Berniece Salines DO Signature Date/Time: 04/04/2022/11:47:27 AM    Final    DG Chest 2 View  Result Date: 04/03/2022 CLINICAL DATA:  Shortness of breath EXAM:  CHEST - 2 VIEW COMPARISON:  01/13/2021 FINDINGS: Cardiac shadow is enlarged but stable. Pacing device is again seen. Diffuse increased vascular congestion is noted with interstitial edema and bilateral pleural effusions consistent with CHF. No acute bony abnormality is seen. IMPRESSION: Changes of CHF. Electronically Signed   By: Inez Catalina M.D.   On: 04/03/2022 03:14    DISCHARGE EXAMINATION: Vitals:   04/05/22 0728 04/05/22 1952 04/06/22 0537 04/06/22 0923  BP: (!) 157/87 (!) 141/76 (!) (P) 108/42 (!) 127/59  Pulse: 66 61 (P) 60 60  Resp: 18 20 (P) 20   Temp:  98 F (36.7 C) (P) 98 F (36.7 C)   TempSrc:  Oral (P) Oral   SpO2: 98% 97% (P) 99%   Weight:      Height:       General appearance: Awake alert.  In no distress Resp: Improved air entry bilaterally.  Much less crackles compared to before.  Normal effort. Cardio: S1-S2 is normal regular.  No S3-S4.  No rubs murmurs or bruit GI: Abdomen is soft.  Nontender nondistended.  Bowel sounds are present normal.  No masses organomegaly   DISPOSITION: Home  Discharge Instructions     (HEART FAILURE PATIENTS) Call MD:  Anytime you have any of the following symptoms: 1) 3 pound weight gain in 24 hours or 5 pounds in 1 week 2) shortness of breath, with or without a dry hacking cough 3) swelling in the hands, feet or stomach 4) if you have to sleep on extra pillows at night in order to breathe.   Complete by: As directed    Avoid straining   Complete by: As directed    Call MD for:  difficulty breathing, headache or visual disturbances   Complete by: As directed    Call MD for:  extreme fatigue   Complete by: As directed    Call MD for:  persistant dizziness or light-headedness   Complete by: As directed    Call MD for:  persistant nausea and vomiting   Complete by: As directed    Call MD for:  severe uncontrolled pain   Complete by: As directed    Call MD for:  temperature >100.4   Complete by: As directed    Diet - low sodium  heart healthy   Complete by: As directed    Discharge instructions   Complete by: As directed    Please take your medications as prescribed.  Follow-up appointments have been made for you with your primary care provider and with the cardiology team.   Heart Failure patients record your daily weight using the same scale at the same time of day   Complete by: As directed    Increase activity slowly   Complete by: As directed    STOP any activity that causes chest pain, shortness of breath, dizziness, sweating, or exessive weakness   Complete by: As directed           Allergies as of 04/06/2022   No Known Allergies      Medication List     STOP taking these medications    amoxicillin 500 MG capsule Commonly known as: AMOXIL   bumetanide 1 MG tablet Commonly known as: BUMEX       TAKE these medications    acetaminophen 650 MG CR tablet Commonly known as: TYLENOL Take 650 mg by mouth 2 (two) times daily as needed for pain.   allopurinol 300 MG tablet Commonly known as: ZYLOPRIM Take 300 mg by mouth daily.   amiodarone 200 MG tablet Commonly known as: PACERONE Take 1 tablet (200 mg total) by mouth daily.   amLODipine 5 MG tablet Commonly known as: NORVASC Take 5 mg by mouth daily.   apixaban 2.5 MG Tabs tablet Commonly known as: ELIQUIS Take 2.5 mg by mouth 2 (two) times daily.   aspirin EC 81 MG tablet Take 81 mg by mouth daily.   atorvastatin 20 MG tablet Commonly known as: LIPITOR Take 20 mg by mouth daily.   carvedilol 12.5 MG tablet Commonly known as: COREG Take 1 tablet (12.5 mg total) by mouth 2 (two) times daily.   FIBER PO Take 1 tablet by mouth daily.   furosemide 40 MG tablet Commonly known as: LASIX Take 2 tablets (80 mg total) by mouth daily.   guaiFENesin 100 MG/5ML liquid Commonly known as: ROBITUSSIN Take 5 mLs by mouth every 4 (four) hours as needed for cough or to loosen phlegm.   hydrALAZINE 25  MG tablet Commonly known as:  APRESOLINE Take 1.5 tablets (37.5 mg total) by mouth 3 (three) times daily.   isosorbide dinitrate 20 MG tablet Commonly known as: ISORDIL Take 1 tablet (20 mg total) by mouth 3 (three) times daily.   pantoprazole 20 MG tablet Commonly known as: PROTONIX Take 20 mg by mouth daily.   PreserVision AREDS 2 Caps Take 1 capsule by mouth 3 (three) times daily.          Follow-up Information     [facility]. Go in 10 day(s).   Specialty: Cardiology Why: Hospital follow up PLEASE bring a current medication list to appointment FREE valet parking, Entrance C, off of [address]. Contact information: [address] [id number] [place] [phone]        Shirley Friar, PA-C. Go on 04/17/2022.   Specialty: Physician Assistant Why: '@11'$ :[place] information: [address] [place] [address] [phone]         Angelina Sheriff, MD. Go on 04/12/2022.   Specialty: Family Medicine Why: '@11'$ :10am Contact information: [place] [address] [phone]         Constance Haw, MD .   Specialty: Cardiology Contact information: [place] [address] [phone]                 TOTAL DISCHARGE TIME: 35 mins  [place]  [facility] Pager on [email or web address]  04/07/2022, 2:02 PM

## 2022-04-06 NOTE — Care Management Important Message (Signed)
Important Message  Patient Details  Name: Steven Rosario MRN: [id number] Date of Birth: 1929-08-10   Medicare Important Message Given:  Yes     Shelda Altes 04/06/2022, 9:52 AM

## 2022-04-06 NOTE — Discharge Instructions (Signed)
Nursing Note Keep all appointments as scheduled. CHECK blood pressure and weigh yourself once daily IN THE MORNING after you urinate. Hold hydralazine if systolic blood pressure (TOP NUMBER) falls below 100. Keep a record or log of blood pressure readings  and  daily weights to take to your doctor appointments. Lezlie Lye RN

## 2022-04-06 NOTE — Plan of Care (Signed)

## 2022-04-10 NOTE — Progress Notes (Signed)
Electrophysiology Office Note Date: 04/17/2022  ID:  Savian Mazon, DOB [birth year], MRN [id number]  PCP: Angelina Sheriff, MD Primary Cardiologist: None Electrophysiologist: Will Meredith Leeds, MD   CC: Pacemaker follow-up  Steven Rosario is a 88 y.o. male seen today for Will Meredith Leeds, MD for routine electrophysiology followup.  Since last being seen in our clinic the patient reports doing very well. His weight and BP have improved and he is overall feeling much better.  he denies chest pain, palpitations, dyspnea, PND, orthopnea, nausea, vomiting, dizziness, syncope, edema, weight gain, or early satiety.  Device History: StMudlogger PPM implanted 2018 for CHB  Past Medical History:  Diagnosis Date   Acute kidney injury (AKI) with acute tubular necrosis (ATN) ([place])    Cervical disc disease    Chronic kidney disease    Coronary artery disease    Gout    Hypertension    Nephrolithiasis    Pneumonia due to COVID-19 virus    Presence of stent in LAD coronary artery    Prostate cancer (HCC)    S/P placement of cardiac pacemaker    Urinary incontinence    Past Surgical History:  Procedure Laterality Date   ANKLE FRACTURE SURGERY Right    cardiac stents x2     caudal epidural injection     CERVICAL DISCECTOMY     COLONOSCOPY     EXPLORATORY LAPAROTOMY     lumbar facet joint injection Bilateral    medical branch block Bilateral    PROSTATECTOMY     RADIOFREQUENCY ABLATION Bilateral    ROTATOR CUFF REPAIR Right    SACROILIAC JOINT ARTHRODESIS Right    SACROILIAC JOINT ARTHRODESIS Bilateral    sacroilliac joint inj     SPIROMETRY      Current Outpatient Medications  Medication Sig Dispense Refill   acetaminophen (TYLENOL) 650 MG CR tablet Take 650 mg by mouth 2 (two) times daily as needed for pain.     allopurinol (ZYLOPRIM) 300 MG tablet Take 300 mg by mouth daily.     amiodarone (PACERONE) 200 MG tablet Take 1 tablet (200 mg total) by mouth daily.  90 tablet 2   amLODipine (NORVASC) 5 MG tablet Take 5 mg by mouth daily.     apixaban (ELIQUIS) 2.5 MG TABS tablet Take 2.5 mg by mouth 2 (two) times daily.     aspirin 81 MG EC tablet Take 81 mg by mouth daily.     atorvastatin (LIPITOR) 20 MG tablet Take 20 mg by mouth daily.     carvedilol (COREG) 12.5 MG tablet Take 1 tablet (12.5 mg total) by mouth 2 (two) times daily. 60 tablet 11   FIBER PO Take 1 tablet by mouth daily.     furosemide (LASIX) 40 MG tablet Take 2 tablets (80 mg total) by mouth daily. 60 tablet 2   guaiFENesin (ROBITUSSIN) 100 MG/5ML liquid Take 5 mLs by mouth every 4 (four) hours as needed for cough or to loosen phlegm. 120 mL 0   hydrALAZINE (APRESOLINE) 25 MG tablet Take 1.5 tablets (37.5 mg total) by mouth 3 (three) times daily. 135 tablet 2   isosorbide dinitrate (ISORDIL) 20 MG tablet Take 1 tablet (20 mg total) by mouth 3 (three) times daily. 90 tablet 2   Multiple Vitamins-Minerals (PRESERVISION AREDS 2) CAPS Take 1 capsule by mouth 3 (three) times daily.     pantoprazole (PROTONIX) 20 MG tablet Take 20 mg by mouth daily.  No current facility-administered medications for this visit.    Allergies:   Patient has no known allergies.   Social History: Social History   Socioeconomic History   Marital status: Widowed    Spouse name: Not on file   Number of children: 4   Years of education: Not on file   Highest education level: 8th grade  Occupational History   Occupation: retired   Tobacco Use   Smoking status: Never   Smokeless tobacco: Never  Vaping Use   Vaping Use: Never used  Substance and Sexual Activity   Alcohol use: Not Currently    Comment: past   Drug use: Never   Sexual activity: Not on file  Other Topics Concern   Not on file  Social History Narrative   Not on file   Social Determinants of Health   Financial Resource Strain: Not on file  Food Insecurity: No Food Insecurity (04/05/2022)   Hunger Vital Sign    Worried About  Running Out of Food in the Last Year: Never true    Ran Out of Food in the Last Year: Never true  Transportation Needs: No Transportation Needs (04/05/2022)   PRAPARE - Hydrologist (Medical): No    Lack of Transportation (Non-Medical): No  Physical Activity: Not on file  Stress: Not on file  Social Connections: Not on file  Intimate Partner Violence: Not on file    Family History: Family History  Problem Relation Age of Onset   Diabetes Mother    Obesity Mother    Heart failure Mother    CAD Father    Bone cancer Father    Heart disease Sister    Obesity Brother    Lung cancer Son      Review of Systems: All other systems reviewed and are otherwise negative except as noted above.  Physical Exam: Vitals:   04/17/22 1108  BP: 120/66  Pulse: 60  SpO2: 98%  Weight: 191 lb 3.2 oz (86.7 kg)  Height: '5\' 9"'$  (1.753 m)     GEN- The patient is well appearing, alert and oriented x 3 today.   HEENT: normocephalic, atraumatic; sclera clear, conjunctiva pink; hearing intact; oropharynx clear; neck supple  Lungs- Clear to ausculation bilaterally, normal work of breathing.  No wheezes, rales, rhonchi Heart- Regular rate and rhythm, no murmurs, rubs or gallops  GI- soft, non-tender, non-distended, bowel sounds present  Extremities- no clubbing or cyanosis. No edema MS- no significant deformity or atrophy Skin- warm and dry, no rash or lesion; PPM pocket well healed Psych- euthymic mood, full affect Neuro- strength and sensation are intact  PPM Interrogation- reviewed in detail today,  See PACEART report  EKG:  EKG is not ordered today. Personal review of ekg ordered  04/04/2022  shows AV dual paced at 60 bpm   Recent Labs: 07/17/2021: ALT 10; TSH 1.830 04/03/2022: B Natriuretic Peptide 1,524.2 04/04/2022: Hemoglobin 11.2; Platelets 195 04/06/2022: BUN 46; Creatinine, Ser 2.74; Potassium 3.9; Sodium 138   Wt Readings from Last 3 Encounters:  04/17/22 191  lb 3.2 oz (86.7 kg)  04/05/22 182 lb 8.7 oz (82.8 kg)  07/17/21 204 lb 6.4 oz (92.7 kg)     Other studies Reviewed: Additional studies/ records that were reviewed today include: Previous EP office notes, Previous remote checks, Most recent labwork.   Assessment and Plan:  1. CHB s/p St. Jude PPM  Normal PPM function See Pace Art report No changes today  2. Atrial  fibrillation Continue amiodarone and eliquis Surveillance labs today NSR by device.   3. HTN Stable on current regimen   4. Acute on chronic combined CHF Echo 03/2022 showed LVEF 35-40%, Grade 2 DD Diuresed with IV lasix.  Not on ACE/ARb due to CKD Switched from bumex to furosemide.  Labs today to make sure Cr is stable.   Current medicines are reviewed at length with the patient today.    Labs/ tests ordered today include:  Orders Placed This Encounter  Procedures   Basic metabolic panel   CBC     Disposition:   Follow up with EP APP in 3 months    Signed, Shirley Friar, PA-C  04/17/2022 11:23 AM  [facility] [address] [place]  [place] [address] [phone] (office) [phone] (fax)

## 2022-04-12 DIAGNOSIS — N179 Acute kidney failure, unspecified: Secondary | ICD-10-CM | POA: Diagnosis not present

## 2022-04-12 DIAGNOSIS — I4891 Unspecified atrial fibrillation: Secondary | ICD-10-CM | POA: Diagnosis not present

## 2022-04-12 DIAGNOSIS — Z6827 Body mass index (BMI) 27.0-27.9, adult: Secondary | ICD-10-CM | POA: Diagnosis not present

## 2022-04-12 DIAGNOSIS — I5040 Unspecified combined systolic (congestive) and diastolic (congestive) heart failure: Secondary | ICD-10-CM | POA: Diagnosis not present

## 2022-04-12 DIAGNOSIS — I2510 Atherosclerotic heart disease of native coronary artery without angina pectoris: Secondary | ICD-10-CM | POA: Diagnosis not present

## 2022-04-16 ENCOUNTER — Encounter (HOSPITAL_COMMUNITY): Payer: Medicare HMO

## 2022-04-16 ENCOUNTER — Telehealth (HOSPITAL_COMMUNITY): Payer: Self-pay | Admitting: *Deleted

## 2022-04-16 ENCOUNTER — Other Ambulatory Visit (HOSPITAL_COMMUNITY): Payer: Self-pay

## 2022-04-16 NOTE — Telephone Encounter (Signed)
Call attempted to confirm HV TOC appt 12 noon on 04/16/22. HIPPA appropriate VM left with callback number.    Earnestine Leys, BSN, Clinical cytogeneticist Only

## 2022-04-17 ENCOUNTER — Encounter: Payer: Self-pay | Admitting: Student

## 2022-04-17 ENCOUNTER — Ambulatory Visit: Payer: Medicare HMO | Admitting: Student

## 2022-04-17 VITALS — BP 120/66 | HR 60 | Ht 69.0 in | Wt 191.2 lb

## 2022-04-17 DIAGNOSIS — I480 Paroxysmal atrial fibrillation: Secondary | ICD-10-CM | POA: Diagnosis not present

## 2022-04-17 DIAGNOSIS — I5043 Acute on chronic combined systolic (congestive) and diastolic (congestive) heart failure: Secondary | ICD-10-CM | POA: Diagnosis not present

## 2022-04-17 DIAGNOSIS — I442 Atrioventricular block, complete: Secondary | ICD-10-CM

## 2022-04-17 LAB — CUP PACEART INCLINIC DEVICE CHECK
Battery Remaining Longevity: 52 mo
Battery Voltage: 2.96 V
Brady Statistic RA Percent Paced: 97 %
Brady Statistic RV Percent Paced: 99 %
Date Time Interrogation Session: 20230718112428
Implantable Lead Implant Date: 20181123
Implantable Lead Implant Date: 20181123
Implantable Lead Location: 753859
Implantable Lead Location: 753860
Implantable Pulse Generator Implant Date: 20181123
Lead Channel Impedance Value: 412.5 Ohm
Lead Channel Impedance Value: 425 Ohm
Lead Channel Pacing Threshold Amplitude: 0.75 V
Lead Channel Pacing Threshold Amplitude: 1 V
Lead Channel Pacing Threshold Pulse Width: 0.5 ms
Lead Channel Pacing Threshold Pulse Width: 0.5 ms
Lead Channel Sensing Intrinsic Amplitude: 1.9 mV
Lead Channel Sensing Intrinsic Amplitude: 12 mV
Lead Channel Setting Pacing Amplitude: 1 V
Lead Channel Setting Pacing Amplitude: 2 V
Lead Channel Setting Pacing Pulse Width: 0.5 ms
Lead Channel Setting Sensing Sensitivity: 2 mV
Pulse Gen Model: 2272
Pulse Gen Serial Number: 8957038

## 2022-04-17 NOTE — Patient Instructions (Addendum)
Medication Instructions:  Your physician recommends that you continue on your current medications as directed. Please refer to the Current Medication list given to you today.  *If you need a refill on your cardiac medications before your next appointment, please call your pharmacy*   Lab Work: TODAY: BMET, CBC  If you have labs (blood work) drawn today and your tests are completely normal, you will receive your results only by: [place] (if you have MyChart) OR A paper copy in the mail If you have any lab test that is abnormal or we need to change your treatment, we will call you to review the results.   Follow-Up: At [facility], you and your health needs are our priority.  As part of our continuing mission to provide you with exceptional heart care, we have created designated Provider Care Teams.  These Care Teams include your primary Cardiologist (physician) and Advanced Practice Providers (APPs -  Physician Assistants and Nurse Practitioners) who all work together to provide you with the care you need, when you need it.   Your next appointment:   07/11/2022

## 2022-04-18 LAB — CBC
Hematocrit: 36.6 % — ABNORMAL LOW (ref 37.5–51.0)
Hemoglobin: 11.8 g/dL — ABNORMAL LOW (ref 13.0–17.7)
MCH: 30.2 pg (ref 26.6–33.0)
MCHC: 32.2 g/dL (ref 31.5–35.7)
MCV: 94 fL (ref 79–97)
Platelets: 226 10*3/uL (ref 150–450)
RBC: 3.91 x10E6/uL — ABNORMAL LOW (ref 4.14–5.80)
RDW: 14.8 % (ref 11.6–15.4)
WBC: 6.4 10*3/uL (ref 3.4–10.8)

## 2022-04-18 LAB — BASIC METABOLIC PANEL
BUN/Creatinine Ratio: 19 (ref 10–24)
BUN: 51 mg/dL — ABNORMAL HIGH (ref 10–36)
CO2: 24 mmol/L (ref 20–29)
Calcium: 9.1 mg/dL (ref 8.6–10.2)
Chloride: 103 mmol/L (ref 96–106)
Creatinine, Ser: 2.75 mg/dL — ABNORMAL HIGH (ref 0.76–1.27)
Glucose: 126 mg/dL — ABNORMAL HIGH (ref 70–99)
Potassium: 4.1 mmol/L (ref 3.5–5.2)
Sodium: 142 mmol/L (ref 134–144)
eGFR: 21 mL/min/{1.73_m2} — ABNORMAL LOW (ref >59)

## 2022-04-23 DIAGNOSIS — N1832 Chronic kidney disease, stage 3b: Secondary | ICD-10-CM | POA: Diagnosis not present

## 2022-04-24 ENCOUNTER — Encounter (HOSPITAL_COMMUNITY): Payer: Self-pay

## 2022-04-24 ENCOUNTER — Ambulatory Visit (HOSPITAL_COMMUNITY)
Admission: RE | Admit: 2022-04-24 | Discharge: 2022-04-24 | Disposition: A | Discharge status: Home / Self Care | Payer: Medicare HMO | Source: Ambulatory Visit | Attending: Adult Health | Admitting: Adult Health

## 2022-04-24 ENCOUNTER — Telehealth (HOSPITAL_COMMUNITY): Payer: Self-pay | Admitting: *Deleted

## 2022-04-24 VITALS — BP 130/60 | HR 64 | Wt 192.8 lb

## 2022-04-24 DIAGNOSIS — Z7901 Long term (current) use of anticoagulants: Secondary | ICD-10-CM | POA: Insufficient documentation

## 2022-04-24 DIAGNOSIS — Z79899 Other long term (current) drug therapy: Secondary | ICD-10-CM | POA: Insufficient documentation

## 2022-04-24 DIAGNOSIS — I5042 Chronic combined systolic (congestive) and diastolic (congestive) heart failure: Secondary | ICD-10-CM | POA: Insufficient documentation

## 2022-04-24 DIAGNOSIS — N184 Chronic kidney disease, stage 4 (severe): Secondary | ICD-10-CM | POA: Insufficient documentation

## 2022-04-24 DIAGNOSIS — I5022 Chronic systolic (congestive) heart failure: Secondary | ICD-10-CM

## 2022-04-24 DIAGNOSIS — Z8616 Personal history of COVID-19: Secondary | ICD-10-CM | POA: Diagnosis not present

## 2022-04-24 DIAGNOSIS — I480 Paroxysmal atrial fibrillation: Secondary | ICD-10-CM | POA: Diagnosis not present

## 2022-04-24 DIAGNOSIS — I130 Hypertensive heart and chronic kidney disease with heart failure and stage 1 through stage 4 chronic kidney disease, or unspecified chronic kidney disease: Secondary | ICD-10-CM | POA: Insufficient documentation

## 2022-04-24 DIAGNOSIS — Z8679 Personal history of other diseases of the circulatory system: Secondary | ICD-10-CM | POA: Insufficient documentation

## 2022-04-24 DIAGNOSIS — R0602 Shortness of breath: Secondary | ICD-10-CM | POA: Insufficient documentation

## 2022-04-24 NOTE — Telephone Encounter (Signed)
Called to confirm Heart & Vascular Transitions of Care appointment at 9 am on 04/24/22. Patient reminded to bring all medications and pill box organizer with them. Confirmed patient has transportation. Gave directions, instructed to utilize [place] parking.  Confirmed appointment prior to ending call.    Earnestine Leys, BSN, Clinical cytogeneticist Only

## 2022-04-24 NOTE — Progress Notes (Signed)
HEART & VASCULAR TRANSITION OF CARE CONSULT NOTE     Referring Physician:Dr Maryland Pink Primary Care: Dr Lin Landsman  Primary Cardiologist: Dr Agustin Cree EP : Dr Curt Bears  HPI: Referred to clinic by Dr Maryland Pink for heart failure consultation.   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   Admitted 04/03/22 with A/C HFrEF. Diuresed with IV lasix and later transitioned to po lasix. GDMT limited by CKD Stage IV.   Overall feeling fine. Walks with a walker. Having some shortness of breath with exertion. He has been able to mow his grass and work out in the yard. Denies PND/Orthopnea. Appetite ok. Tries to follow low salt diet. No fever or chills. Weight at home 180-182 pounds. Taking all medications. Lives alone but has family close by.    Cardiac Testing  Echo EF 35-40% RV ok Grade II DD.   Review of Systems: [y] = yes, '[ ]'$  = no   General: Weight gain '[ ]'$ ; Weight loss '[ ]'$ ; Anorexia '[ ]'$ ; Fatigue '[ ]'$ ; Fever '[ ]'$ ; Chills '[ ]'$ ; Weakness '[ ]'$   Cardiac: Chest pain/pressure '[ ]'$ ; Resting SOB '[ ]'$ ; Exertional SOB [ Y]; Orthopnea '[ ]'$ ; Pedal Edema '[ ]'$ ; Palpitations '[ ]'$ ; Syncope '[ ]'$ ; Presyncope '[ ]'$ ; Paroxysmal nocturnal dyspnea'[ ]'$   Pulmonary: Cough '[ ]'$ ; Wheezing'[ ]'$ ; Hemoptysis'[ ]'$ ; Sputum '[ ]'$ ; Snoring '[ ]'$   GI: Vomiting'[ ]'$ ; Dysphagia'[ ]'$ ; Melena'[ ]'$ ; Hematochezia '[ ]'$ ; Heartburn'[ ]'$ ; Abdominal pain '[ ]'$ ; Constipation '[ ]'$ ; Diarrhea '[ ]'$ ; BRBPR '[ ]'$   GU: Hematuria'[ ]'$ ; Dysuria '[ ]'$ ; Nocturia'[ ]'$   Vascular: Pain in legs with walking '[ ]'$ ; Pain in feet with lying flat '[ ]'$ ; Non-healing sores '[ ]'$ ; Stroke '[ ]'$ ; TIA '[ ]'$ ; Slurred speech '[ ]'$ ;  Neuro: Headaches'[ ]'$ ; Vertigo'[ ]'$ ; Seizures'[ ]'$ ; Paresthesias'[ ]'$ ;Blurred vision '[ ]'$ ; Diplopia '[ ]'$ ; Vision changes '[ ]'$   Ortho/Skin: Arthritis [  ]; Joint pain [ Y]; Muscle pain '[ ]'$ ; Joint swelling '[ ]'$ ; Back Pain [ Y]; Rash '[ ]'$   Psych: Depression'[ ]'$ ; Anxiety'[ ]'$   Heme: Bleeding problems '[ ]'$ ; Clotting disorders '[ ]'$ ; Anemia '[ ]'$   Endocrine: Diabetes '[ ]'$ ; Thyroid dysfunction'[ ]'$    Past Medical History:  Diagnosis Date   Acute kidney injury (AKI) with acute tubular necrosis (ATN) (HCC)    Cervical disc disease    Chronic kidney disease    Coronary artery disease    Gout    Hypertension    Nephrolithiasis    Pneumonia due to COVID-19 virus    Presence of stent in LAD coronary artery    Prostate cancer (HCC)    S/P placement of cardiac pacemaker    Urinary incontinence     Current Outpatient Medications  Medication Sig Dispense Refill   acetaminophen (TYLENOL) 650 MG CR tablet Take 650 mg by mouth 2 (two) times daily as needed for pain.     allopurinol (ZYLOPRIM) 300 MG tablet Take 300 mg by mouth daily.     amiodarone (PACERONE) 200 MG tablet Take 1 tablet (200 mg total) by mouth daily. 90 tablet 2   amLODipine (NORVASC) 5 MG tablet Take 5 mg by mouth daily.     apixaban (ELIQUIS) 2.5 MG TABS tablet Take 2.5 mg by mouth 2 (two) times  daily.     aspirin 81 MG EC tablet Take 81 mg by mouth daily.     atorvastatin (LIPITOR) 20 MG tablet Take 20 mg by mouth daily.     carvedilol (COREG) 12.5 MG tablet Take 1 tablet (12.5 mg total) by mouth 2 (two) times daily. 60 tablet 11   FIBER PO Take 1 tablet by mouth daily.     furosemide (LASIX) 40 MG tablet Take 2 tablets (80 mg total) by mouth daily. 60 tablet 2   hydrALAZINE (APRESOLINE) 25 MG tablet Take 1.5 tablets (37.5 mg total) by mouth 3 (three) times daily. 135 tablet 2   isosorbide dinitrate (ISORDIL) 20 MG tablet Take 1 tablet (20 mg total) by mouth 3 (three) times daily. 90 tablet 2   Multiple Vitamins-Minerals (PRESERVISION AREDS 2) CAPS Take 1 capsule by mouth 3 (three) times daily.     pantoprazole (PROTONIX) 20 MG tablet Take 20 mg by mouth daily.     No current  facility-administered medications for this encounter.    No Known Allergies    Social History   Socioeconomic History   Marital status: Widowed    Spouse name: Not on file   Number of children: 4   Years of education: Not on file   Highest education level: 8th grade  Occupational History   Occupation: retired   Tobacco Use   Smoking status: Never   Smokeless tobacco: Never  Vaping Use   Vaping Use: Never used  Substance and Sexual Activity   Alcohol use: Not Currently    Comment: past   Drug use: Never   Sexual activity: Not on file  Other Topics Concern   Not on file  Social History Narrative   Not on file   Social Determinants of Health   Financial Resource Strain: Not on file  Food Insecurity: No Food Insecurity (04/05/2022)   Hunger Vital Sign    Worried About Running Out of Food in the Last Year: Never true    Ran Out of Food in the Last Year: Never true  Transportation Needs: No Transportation Needs (04/05/2022)   PRAPARE - Hydrologist (Medical): No    Lack of Transportation (Non-Medical): No  Physical Activity: Not on file  Stress: Not on file  Social Connections: Not on file  Intimate Partner Violence: Not on file      Family History  Problem Relation Age of Onset   Diabetes Mother    Obesity Mother    Heart failure Mother    CAD Father    Bone cancer Father    Heart disease Sister    Obesity Brother    Lung cancer Son     Vitals:   04/24/22 0903  BP: 130/60  Pulse: 64  SpO2: 98%  Weight: 87.5 kg (192 lb 12.8 oz)   Wt Readings from Last 3 Encounters:  04/24/22 87.5 kg (192 lb 12.8 oz)  04/17/22 86.7 kg (191 lb 3.2 oz)  04/05/22 82.8 kg (182 lb 8.7 oz)    PHYSICAL EXAM: General:  Walked in the clinic with a walker.  No respiratory difficulty HEENT: normal. Bilateral hearing aides.  Neck: supple. no JVD. Carotids 2+ bilat; no bruits. No lymphadenopathy or thryomegaly appreciated. Cor: PMI nondisplaced.  Regular rate & rhythm. No rubs, gallops or murmurs. Lungs: clear Abdomen: soft, nontender, nondistended. No hepatosplenomegaly. No bruits or masses. Good bowel sounds. Extremities: no cyanosis, clubbing, rash, edema Neuro: alert & oriented x 3, cranial nerves  grossly intact. moves all 4 extremities w/o difficulty. Affect pleasant.  ASSESSMENT & PLAN: 1. Crhonc combined systolic/diastolic HF.  Echo EF 35-40%, Grade IIDD NYHA III.  GDMT limited by CKD Stage IV Volume status ok.  Diuretic- Volume status ok. Continue lasix 80 mg daily. Discussed taking an extra 40 mg lasix if weight goes up 3 pounds in 24 hours.  BB- Cotnine carvedilol 12.5 mg twice a day  Ace/ARB/ARNI- No CKD Stage IV MRA- No CKD Stage IV SGLT2i- Hold off,  Continue hydralazinee 37.5 mg three times a day + isordil 20 mg three times a day.   2. CKD Stage IV Creatinine baselinine 2. 5 ]He had BMET on 7/24 . Creatinine 2.6.  Check BMET   3. HTN  Stable.   4. PAF -Continue amio 200 mg daily.  - Continue eliquis   Follow up as needed.      Referred to HFSW (PCP, Medications, Transportation, ETOH Abuse, Drug Abuse, Insurance, Financial ): No Refer to Pharmacy:  No Refer to Home Health:  No Refer to Advanced Heart Failure Clinic: No Refer to General Cardiology: [facility] Patient.   Follow up as needed.   Emaan Gary NP-C  11:35 AM

## 2022-04-24 NOTE — Progress Notes (Signed)
ReDS Vest / Clip - 04/24/22 0900       ReDS Vest / Clip   Station Marker D    Ruler Value 33    ReDS Value Range High volume overload    ReDS Actual Value 43    Anatomical Comments sitting

## 2022-04-24 NOTE — Patient Instructions (Signed)
It was great to see you today! No medication changes are needed at this time.  Be sure to take an additional 40 mg of Furosemide as needed for 3 lb weight gain overnight or 5 lb weight gain in one week.      Thank you for allowing [place] to provider your heart failure care after your recent hospitalization. Please follow-up with the Heart Failure Clinic as needed. Keep cardiology follow up as scheduled   Do the following things EVERYDAY: Weigh yourself in the morning before breakfast. Write it down and keep it in a log. Take your medicines as prescribed Eat low salt foods--Limit salt (sodium) to 2000 mg per day.  Stay as active as you can everyday Limit all fluids for the day to less than 2 liters

## 2022-04-30 DIAGNOSIS — D631 Anemia in chronic kidney disease: Secondary | ICD-10-CM | POA: Diagnosis not present

## 2022-04-30 DIAGNOSIS — N281 Cyst of kidney, acquired: Secondary | ICD-10-CM | POA: Diagnosis not present

## 2022-04-30 DIAGNOSIS — N184 Chronic kidney disease, stage 4 (severe): Secondary | ICD-10-CM | POA: Diagnosis not present

## 2022-04-30 DIAGNOSIS — R609 Edema, unspecified: Secondary | ICD-10-CM | POA: Diagnosis not present

## 2022-04-30 DIAGNOSIS — N2581 Secondary hyperparathyroidism of renal origin: Secondary | ICD-10-CM | POA: Diagnosis not present

## 2022-04-30 DIAGNOSIS — I509 Heart failure, unspecified: Secondary | ICD-10-CM | POA: Diagnosis not present

## 2022-04-30 DIAGNOSIS — I129 Hypertensive chronic kidney disease with stage 1 through stage 4 chronic kidney disease, or unspecified chronic kidney disease: Secondary | ICD-10-CM | POA: Diagnosis not present

## 2022-05-14 ENCOUNTER — Ambulatory Visit: Payer: Medicare PPO

## 2022-05-14 LAB — CUP PACEART REMOTE DEVICE CHECK
Battery Remaining Longevity: 56 mo
Battery Remaining Percentage: 50 %
Battery Voltage: 2.96 V
Brady Statistic AP VP Percent: 98 %
Brady Statistic AP VS Percent: 1 %
Brady Statistic AS VP Percent: 1.5 %
Brady Statistic AS VS Percent: 1 %
Brady Statistic RA Percent Paced: 98 %
Brady Statistic RV Percent Paced: 99 %
Date Time Interrogation Session: 20230814020016
Implantable Lead Implant Date: 20181123
Implantable Lead Implant Date: 20181123
Implantable Lead Location: 753859
Implantable Lead Location: 753860
Implantable Pulse Generator Implant Date: 20181123
Lead Channel Impedance Value: 410 Ohm
Lead Channel Impedance Value: 410 Ohm
Lead Channel Pacing Threshold Amplitude: 0.75 V
Lead Channel Pacing Threshold Amplitude: 0.75 V
Lead Channel Pacing Threshold Pulse Width: 0.5 ms
Lead Channel Pacing Threshold Pulse Width: 0.5 ms
Lead Channel Sensing Intrinsic Amplitude: 1.5 mV
Lead Channel Sensing Intrinsic Amplitude: 12 mV
Lead Channel Setting Pacing Amplitude: 1 V
Lead Channel Setting Pacing Amplitude: 1.75 V
Lead Channel Setting Pacing Pulse Width: 0.5 ms
Lead Channel Setting Sensing Sensitivity: 2 mV
Pulse Gen Model: 2272
Pulse Gen Serial Number: 8957038

## 2022-05-24 DIAGNOSIS — K5909 Other constipation: Secondary | ICD-10-CM | POA: Diagnosis not present

## 2022-05-24 DIAGNOSIS — I10 Essential (primary) hypertension: Secondary | ICD-10-CM | POA: Diagnosis not present

## 2022-05-24 DIAGNOSIS — I2510 Atherosclerotic heart disease of native coronary artery without angina pectoris: Secondary | ICD-10-CM | POA: Diagnosis not present

## 2022-05-24 DIAGNOSIS — Z6827 Body mass index (BMI) 27.0-27.9, adult: Secondary | ICD-10-CM | POA: Diagnosis not present

## 2022-05-28 ENCOUNTER — Telehealth: Payer: Self-pay | Admitting: Cardiology

## 2022-05-28 NOTE — Telephone Encounter (Signed)
Follow Up:     Steven Rosario is call from [facility]. She is checking on th status of patient's clearance. Please fax this to [phone].

## 2022-05-28 NOTE — Telephone Encounter (Signed)
   I do not see any active request in patient's chart. Please contact surgeon's office to request clearance info. Richardson Dopp, PA-C    05/28/2022 9:53 PM

## 2022-05-29 ENCOUNTER — Telehealth: Payer: Self-pay

## 2022-05-29 ENCOUNTER — Other Ambulatory Visit: Payer: Self-pay | Admitting: *Deleted

## 2022-05-29 NOTE — Telephone Encounter (Signed)
Notes faxed to surgeon. This phone note will be removed from the preop pool. Richardson Dopp, PA-C  05/29/2022 5:10 PM

## 2022-05-29 NOTE — Telephone Encounter (Signed)
   Patient Name:  Steven Rosario  DOB:  1929/07/15  MRN:  [id number]   Primary Cardiologist: None  Chart reviewed as part of pre-operative protocol coverage.   Simple dental extractions are considered low risk procedures per guidelines and generally do not require any specific cardiac clearance. It is also generally accepted that for simple extractions and dental cleanings, there is no need to interrupt blood thinner therapy. Since pt is on both Eliquis and ASA, will recommend hold ASA briefly.  Recommendations: SBE prophylaxis is not required for the patient from a cardiac standpoint. Continue Eliquis without interruption. Ok to hold ASA for 3 days, then resume post op.  Please call with questions.  Richardson Dopp, PA-C 05/29/2022, 5:05 PM

## 2022-05-29 NOTE — Telephone Encounter (Signed)
   [facility] Pre-operative Risk Assessment    Request for surgical clearance:  What type of surgery is being performed? Dental extraction   When is this surgery scheduled? TBD   What type of clearance is required (medical clearance vs. Pharmacy clearance to hold med vs. Both)? Both  Are there any medications that need to be held prior to surgery and how long?Not specified   Practice name and name of physician performing surgery? Dr. Natividad Brood at [facility]   What is your office phone number: [phone]    7.   What is your office fax number: [phone]  8.   Anesthesia type (None, local, MAC, general) ? Not specified.  Also, requesting last office notes and medication list   Steven Rosario 05/29/2022, 3:38 PM  _________________________________________________________________   (provider comments below)

## 2022-05-29 NOTE — Telephone Encounter (Signed)
I s/w Jessica with dental office. Janett Billow will fax over clearance request to [phone] attn: pre op team

## 2022-05-29 NOTE — Telephone Encounter (Signed)
Clearance was entered in earlier today. See clearance notes entered in with the phone started yesterday. This is a duplicate.

## 2022-05-29 NOTE — Telephone Encounter (Signed)
   Pre-operative Risk Assessment    Patient Name: Steven Rosario  DOB: 04/02/1929 MRN: [id number]     Request for Surgical Clearance    Procedure:  Dental Extraction - Amount of Teeth to be Pulled:  x 2 TEETH SURGICALLY EXTRACTED  Date of Surgery:  Clearance TBD                               Surgeon:  DR. GARY DeSALVO Surgeon's Group or Practice Name:  [facility] Phone number:  [phone] Fax number:  [phone]   Type of Clearance Requested:   - Medical  - Pharmacy:  Hold Aspirin and Apixaban (Eliquis)     Type of Anesthesia:  Local    Additional requests/questions:    Jiles Prows   05/29/2022, 3:38 PM

## 2022-05-29 NOTE — Telephone Encounter (Signed)
   Please clarify with dentist number of teeth to be extracted.  Richardson Dopp, PA-C    05/29/2022 4:10 PM

## 2022-05-30 MED ORDER — FUROSEMIDE 40 MG PO TABS: 80.0000 mg | ORAL_TABLET | Freq: Every day | ORAL | 3 refills | Status: DC

## 2022-05-30 MED ORDER — ISOSORBIDE DINITRATE 20 MG PO TABS: 20.0000 mg | ORAL_TABLET | Freq: Three times a day (TID) | ORAL | 3 refills | Status: DC

## 2022-05-30 MED ORDER — HYDRALAZINE HCL 25 MG PO TABS: 37.5000 mg | ORAL_TABLET | Freq: Three times a day (TID) | ORAL | 3 refills | Status: DC

## 2022-06-06 NOTE — Telephone Encounter (Signed)
I s/w Hoyle Sauer with Dr. Berniece Pap, [place] office and stated that we have faxed over clearance notes on 05/29/22 @ 5:10 pm from [place], [facility].   Hoyle Sauer put on a brief hold, came back to phone and stated the nurse said the form they got back was blank. I explained that we have a format for the pre op provider and the cardiologist and the pharm-d to follow. We take ALL of the information that is presented on the clearance request and input this information into the pre op format for the team to review for assessment. Hoyle Sauer did apologize the they did not receive the clearance notes back and that it was blank. I was asked if I could please re-send clearance notes. I did confirm the fax # [phone]  The request that came to our office today, has a hand written note attempted to fax multiple times and have called. I stated I was not sure where they faxed to our what number was their office calling. The best # they can fax to will be [phone] attn: pre op team. The notes also asked for a last office note and med list, as it was written on the form that the pt could not tell them if he was Eliquis.   See the clearance notes from Richardson Dopp, [facility]. We have noted about Eliquis and ASA recommendations.   I can fax last ov note, though understand that is the not the clearance note. The clearance note is from [place], [facility]. Dr. Agustin Cree is aware of pre op notes and recommendations as well.   We strive to give the best care for our pt's in all aspects. Please let us know if you have any other questions.

## 2022-07-09 NOTE — Progress Notes (Unsigned)
Electrophysiology Office Note Date: 07/09/2022  ID:  Steven Rosario, DOB 1929-04-02, MRN [id number]  PCP: Steven Sheriff, MD Primary Cardiologist: None Electrophysiologist: Steven Meredith Leeds, MD   CC: Pacemaker follow-up  Steven Rosario is a 88 y.o. male seen today for Steven Meredith Leeds, MD for routine electrophysiology followup. Since last being seen in our clinic the patient reports doing ***.  he denies chest pain, palpitations, dyspnea, PND, orthopnea, nausea, vomiting, dizziness, syncope, edema, weight gain, or early satiety.   Device History: StMudlogger PPM implanted 2018 for CHB  Past Medical History:  Diagnosis Date   Acute kidney injury (AKI) with acute tubular necrosis (ATN) ([place])    Cervical disc disease    Chronic kidney disease    Coronary artery disease    Gout    Hypertension    Nephrolithiasis    Pneumonia due to COVID-19 virus    Presence of stent in LAD coronary artery    Prostate cancer (HCC)    S/P placement of cardiac pacemaker    Urinary incontinence    Past Surgical History:  Procedure Laterality Date   ANKLE FRACTURE SURGERY Right    cardiac stents x2     caudal epidural injection     CERVICAL DISCECTOMY     COLONOSCOPY     EXPLORATORY LAPAROTOMY     lumbar facet joint injection Bilateral    medical branch block Bilateral    PROSTATECTOMY     RADIOFREQUENCY ABLATION Bilateral    ROTATOR CUFF REPAIR Right    SACROILIAC JOINT ARTHRODESIS Right    SACROILIAC JOINT ARTHRODESIS Bilateral    sacroilliac joint inj     SPIROMETRY      Current Outpatient Medications  Medication Sig Dispense Refill   acetaminophen (TYLENOL) 650 MG CR tablet Take 650 mg by mouth 2 (two) times daily as needed for pain.     allopurinol (ZYLOPRIM) 300 MG tablet Take 300 mg by mouth daily.     amiodarone (PACERONE) 200 MG tablet Take 1 tablet (200 mg total) by mouth daily. 90 tablet 2   amLODipine (NORVASC) 5 MG tablet Take 5 mg by mouth daily.      apixaban (ELIQUIS) 2.5 MG TABS tablet Take 2.5 mg by mouth 2 (two) times daily.     aspirin 81 MG EC tablet Take 81 mg by mouth daily.     atorvastatin (LIPITOR) 20 MG tablet Take 20 mg by mouth daily.     carvedilol (COREG) 12.5 MG tablet Take 1 tablet (12.5 mg total) by mouth 2 (two) times daily. 60 tablet 11   FIBER PO Take 1 tablet by mouth daily.     furosemide (LASIX) 40 MG tablet Take 2 tablets (80 mg total) by mouth daily. 180 tablet 3   hydrALAZINE (APRESOLINE) 25 MG tablet Take 1.5 tablets (37.5 mg total) by mouth 3 (three) times daily. 405 tablet 3   isosorbide dinitrate (ISORDIL) 20 MG tablet Take 1 tablet (20 mg total) by mouth 3 (three) times daily. 270 tablet 3   Multiple Vitamins-Minerals (PRESERVISION AREDS 2) CAPS Take 1 capsule by mouth 3 (three) times daily.     pantoprazole (PROTONIX) 20 MG tablet Take 20 mg by mouth daily.     No current facility-administered medications for this visit.    Allergies:   Patient has no known allergies.   Social History: Social History   Socioeconomic History   Marital status: Widowed    Spouse name: Not on file  Number of children: 4   Years of education: Not on file   Highest education level: 8th grade  Occupational History   Occupation: retired   Tobacco Use   Smoking status: Never   Smokeless tobacco: Never  Vaping Use   Vaping Use: Never used  Substance and Sexual Activity   Alcohol use: Not Currently    Comment: past   Drug use: Never   Sexual activity: Not on file  Other Topics Concern   Not on file  Social History Narrative   Not on file   Social Determinants of Health   Financial Resource Strain: Not on file  Food Insecurity: No Food Insecurity (04/05/2022)   Hunger Vital Sign    Worried About Running Out of Food in the Last Year: Never true    Ran Out of Food in the Last Year: Never true  Transportation Needs: No Transportation Needs (04/05/2022)   PRAPARE - Hydrologist  (Medical): No    Lack of Transportation (Non-Medical): No  Physical Activity: Not on file  Stress: Not on file  Social Connections: Not on file  Intimate Partner Violence: Not on file    Family History: Family History  Problem Relation Age of Onset   Diabetes Mother    Obesity Mother    Heart failure Mother    CAD Father    Bone cancer Father    Heart disease Sister    Obesity Brother    Lung cancer Son      Review of Systems: All other systems reviewed and are otherwise negative except as noted above.  Physical Exam: There were no vitals filed for this visit.   GEN- The patient is well appearing, alert and oriented x 3 today.   HEENT: normocephalic, atraumatic; sclera clear, conjunctiva pink; hearing intact; oropharynx clear; neck supple, no JVP Lymph- no cervical lymphadenopathy Lungs- Clear to ausculation bilaterally, normal work of breathing.  No wheezes, rales, rhonchi Heart- Regular  rate and rhythm, no murmurs, rubs or gallops, PMI not laterally displaced GI- soft, non-tender, non-distended, bowel sounds present, no hepatosplenomegaly Extremities- no clubbing or cyanosis. {EDEMA PNTIR:44315} peripheral edema; DP/PT/radial pulses 2+ bilaterally MS- no significant deformity or atrophy Skin- warm and dry, no rash or lesion; PPM pocket well healed Psych- euthymic mood, full affect Neuro- strength and sensation are intact  PPM Interrogation-  reviewed in detail today,  See PACEART report.  EKG:  EKG {ACTION; IS/IS QMG:86761950} ordered today. Personal review of ekg ordered {Blank single:19197::"today","***"} shows ***   Recent Labs: 07/17/2021: ALT 10; TSH 1.830 04/03/2022: B Natriuretic Peptide 1,524.2 04/17/2022: BUN 51; Creatinine, Ser 2.75; Hemoglobin 11.8; Platelets 226; Potassium 4.1; Sodium 142   Wt Readings from Last 3 Encounters:  04/24/22 192 lb 12.8 oz (87.5 kg)  04/17/22 191 lb 3.2 oz (86.7 kg)  04/05/22 182 lb 8.7 oz (82.8 kg)     Other studies  Reviewed: Additional studies/ records that were reviewed today include: Previous EP office notes, Previous remote checks, Most recent labwork.   Assessment and Plan:  1. CHB s/p St. Jude PPM  Normal PPM function See Pace Art report No changes today  2. Atrial fibrillation Continue amiodarone and eliquis Surveillance labs today ***  NSR by device.    3. HTN Stable on current regimen    4. Acute on chronic combined CHF Echo 03/2022 showed LVEF 35-40%, Grade 2 DD Not on ACE/ARb due to CKD Switched from bumex to furosemide.  Update labs today.  Current medicines are reviewed at length with the patient today.    Labs/ tests ordered today include: *** No orders of the defined types were placed in this encounter.    Disposition:   Follow up with {EPPROVIDERS:28135} in {Blank single:19197::"2 weeks","4 weeks","3 months","6 months","12 months","as usual post gen change"}    Signed, Shirley Friar, PA-C  07/09/2022 1:28 PM  [place] [address] [phone] (office) [phone] (fax)

## 2022-07-11 ENCOUNTER — Ambulatory Visit: Payer: Medicare HMO | Attending: Student | Admitting: Student

## 2022-07-11 ENCOUNTER — Encounter: Payer: Self-pay | Admitting: Student

## 2022-07-11 VITALS — BP 120/64 | HR 70 | Ht 69.0 in | Wt 184.8 lb

## 2022-07-11 DIAGNOSIS — I5022 Chronic systolic (congestive) heart failure: Secondary | ICD-10-CM

## 2022-07-11 DIAGNOSIS — I480 Paroxysmal atrial fibrillation: Secondary | ICD-10-CM | POA: Diagnosis not present

## 2022-07-11 DIAGNOSIS — I442 Atrioventricular block, complete: Secondary | ICD-10-CM

## 2022-07-11 LAB — CUP PACEART INCLINIC DEVICE CHECK
Battery Remaining Longevity: 49 mo
Battery Voltage: 2.96 V
Brady Statistic RA Percent Paced: 96 %
Brady Statistic RV Percent Paced: 99.9 %
Date Time Interrogation Session: 20231011130054
Implantable Lead Implant Date: 20181123
Implantable Lead Implant Date: 20181123
Implantable Lead Location: 753859
Implantable Lead Location: 753860
Implantable Pulse Generator Implant Date: 20181123
Lead Channel Impedance Value: 387.5 Ohm
Lead Channel Impedance Value: 400 Ohm
Lead Channel Pacing Threshold Amplitude: 0.875 V
Lead Channel Pacing Threshold Amplitude: 0.875 V
Lead Channel Pacing Threshold Pulse Width: 0.5 ms
Lead Channel Pacing Threshold Pulse Width: 0.5 ms
Lead Channel Sensing Intrinsic Amplitude: 0.75 mV
Lead Channel Sensing Intrinsic Amplitude: 12 mV
Lead Channel Setting Pacing Amplitude: 1.125
Lead Channel Setting Pacing Amplitude: 1.875
Lead Channel Setting Pacing Pulse Width: 0.5 ms
Lead Channel Setting Sensing Sensitivity: 2 mV
Pulse Gen Model: 2272
Pulse Gen Serial Number: 8957038

## 2022-07-11 LAB — COMPREHENSIVE METABOLIC PANEL
ALT: 16 IU/L (ref 0–44)
AST: 21 IU/L (ref 0–40)
Albumin/Globulin Ratio: 1.6 (ref 1.2–2.2)
Albumin: 3.9 g/dL (ref 3.6–4.6)
Alkaline Phosphatase: 89 IU/L (ref 44–121)
BUN/Creatinine Ratio: 13 (ref 10–24)
BUN: 43 mg/dL — ABNORMAL HIGH (ref 10–36)
Bilirubin Total: 0.4 mg/dL (ref 0.0–1.2)
CO2: 23 mmol/L (ref 20–29)
Calcium: 8.9 mg/dL (ref 8.6–10.2)
Chloride: 105 mmol/L (ref 96–106)
Creatinine, Ser: 3.4 mg/dL — ABNORMAL HIGH (ref 0.76–1.27)
Globulin, Total: 2.4 g/dL (ref 1.5–4.5)
Glucose: 116 mg/dL — ABNORMAL HIGH (ref 70–99)
Potassium: 3.8 mmol/L (ref 3.5–5.2)
Sodium: 144 mmol/L (ref 134–144)
Total Protein: 6.3 g/dL (ref 6.0–8.5)
eGFR: 16 mL/min/{1.73_m2} — ABNORMAL LOW (ref >59)

## 2022-07-11 LAB — T4, FREE: Free T4: 1.69 ng/dL (ref 0.82–1.77)

## 2022-07-11 LAB — TSH: TSH: 1.96 u[IU]/mL (ref 0.450–4.500)

## 2022-07-11 NOTE — Patient Instructions (Signed)
Medication Instructions:  Your physician has recommended you make the following change in your medication:   INCREASE: Amiodarone to '200mg'$  twice daily for 2 weeks then take '200mg'$  daily  *If you need a refill on your cardiac medications before your next appointment, please call your pharmacy*   Lab Work: TODAY: CMET, TSH, FreeT4  If you have labs (blood work) drawn today and your tests are completely normal, you will receive your results only by: [place] (if you have MyChart) OR A paper copy in the mail If you have any lab test that is abnormal or we need to change your treatment, we will call you to review the results.   Follow-Up: At [facility], you and your health needs are our priority.  As part of our continuing mission to provide you with exceptional heart care, we have created designated Provider Care Teams.  These Care Teams include your primary Cardiologist (physician) and Advanced Practice Providers (APPs -  Physician Assistants and Nurse Practitioners) who all work together to provide you with the care you need, when you need it.   Your next appointment:   As scheduled  Important Information About Sugar

## 2022-07-16 DIAGNOSIS — N184 Chronic kidney disease, stage 4 (severe): Secondary | ICD-10-CM | POA: Diagnosis not present

## 2022-07-16 DIAGNOSIS — M8589 Other specified disorders of bone density and structure, multiple sites: Secondary | ICD-10-CM | POA: Diagnosis not present

## 2022-07-16 DIAGNOSIS — Z0000 Encounter for general adult medical examination without abnormal findings: Secondary | ICD-10-CM | POA: Diagnosis not present

## 2022-07-16 DIAGNOSIS — Z6827 Body mass index (BMI) 27.0-27.9, adult: Secondary | ICD-10-CM | POA: Diagnosis not present

## 2022-07-16 DIAGNOSIS — Z23 Encounter for immunization: Secondary | ICD-10-CM | POA: Diagnosis not present

## 2022-07-18 ENCOUNTER — Other Ambulatory Visit: Payer: Self-pay

## 2022-07-18 MED ORDER — AMIODARONE HCL 200 MG PO TABS: 200.0000 mg | ORAL_TABLET | Freq: Every day | ORAL | 3 refills | Status: DC

## 2022-07-25 DIAGNOSIS — I130 Hypertensive heart and chronic kidney disease with heart failure and stage 1 through stage 4 chronic kidney disease, or unspecified chronic kidney disease: Secondary | ICD-10-CM | POA: Diagnosis not present

## 2022-07-25 DIAGNOSIS — D631 Anemia in chronic kidney disease: Secondary | ICD-10-CM | POA: Diagnosis not present

## 2022-07-25 DIAGNOSIS — I509 Heart failure, unspecified: Secondary | ICD-10-CM | POA: Diagnosis not present

## 2022-07-25 DIAGNOSIS — N184 Chronic kidney disease, stage 4 (severe): Secondary | ICD-10-CM | POA: Diagnosis not present

## 2022-07-25 DIAGNOSIS — N281 Cyst of kidney, acquired: Secondary | ICD-10-CM | POA: Diagnosis not present

## 2022-07-25 DIAGNOSIS — N179 Acute kidney failure, unspecified: Secondary | ICD-10-CM | POA: Diagnosis not present

## 2022-07-25 DIAGNOSIS — N2581 Secondary hyperparathyroidism of renal origin: Secondary | ICD-10-CM | POA: Diagnosis not present

## 2022-07-25 DIAGNOSIS — E785 Hyperlipidemia, unspecified: Secondary | ICD-10-CM | POA: Diagnosis not present

## 2022-07-27 ENCOUNTER — Telehealth: Payer: Self-pay | Admitting: Student

## 2022-07-27 NOTE — Telephone Encounter (Signed)
Called Home and Cell number. No answer on either.

## 2022-07-27 NOTE — Telephone Encounter (Signed)
Shirley Friar, PA-C  07/12/2022  8:07 AM EDT     AKI noted. Please have him hold lasix x 1 day.      Needs very close follow up with his nephrologist   Pt returned my call. He was seen by his Nephrologist on 10/25 and they done more labwork.

## 2022-07-27 NOTE — Telephone Encounter (Signed)
  Pt is returning call to get lab result. Pt said, to call him on his home phone if he didn't answer then call him on his cell phone number

## 2022-08-08 ENCOUNTER — Ambulatory Visit: Payer: Medicare HMO | Attending: Student | Admitting: Student

## 2022-08-08 ENCOUNTER — Encounter: Payer: Self-pay | Admitting: Student

## 2022-08-08 VITALS — BP 122/58 | HR 70 | Ht 69.0 in | Wt 190.0 lb

## 2022-08-08 DIAGNOSIS — I442 Atrioventricular block, complete: Secondary | ICD-10-CM

## 2022-08-08 DIAGNOSIS — I5022 Chronic systolic (congestive) heart failure: Secondary | ICD-10-CM | POA: Diagnosis not present

## 2022-08-08 DIAGNOSIS — I480 Paroxysmal atrial fibrillation: Secondary | ICD-10-CM

## 2022-08-08 LAB — CUP PACEART INCLINIC DEVICE CHECK
Battery Remaining Longevity: 46 mo
Battery Voltage: 2.96 V
Brady Statistic RA Percent Paced: 97 %
Brady Statistic RV Percent Paced: 99.97 %
Date Time Interrogation Session: 20231108120031
Implantable Lead Connection Status: 753985
Implantable Lead Connection Status: 753985
Implantable Lead Implant Date: 20181123
Implantable Lead Implant Date: 20181123
Implantable Lead Location: 753859
Implantable Lead Location: 753860
Implantable Pulse Generator Implant Date: 20181123
Lead Channel Impedance Value: 375 Ohm
Lead Channel Impedance Value: 387.5 Ohm
Lead Channel Pacing Threshold Amplitude: 0.875 V
Lead Channel Pacing Threshold Amplitude: 1.125 V
Lead Channel Pacing Threshold Pulse Width: 0.5 ms
Lead Channel Pacing Threshold Pulse Width: 0.5 ms
Lead Channel Sensing Intrinsic Amplitude: 1.2 mV
Lead Channel Sensing Intrinsic Amplitude: 12 mV
Lead Channel Setting Pacing Amplitude: 1.125
Lead Channel Setting Pacing Amplitude: 2.125
Lead Channel Setting Pacing Pulse Width: 0.5 ms
Lead Channel Setting Sensing Sensitivity: 2 mV
Pulse Gen Model: 2272
Pulse Gen Serial Number: 8957038

## 2022-08-08 NOTE — Progress Notes (Signed)
Electrophysiology Office Note Date: 08/08/2022  ID:  Steven Rosario, DOB July 19, 1929, MRN [id number]  PCP: Angelina Sheriff, MD Primary Cardiologist: None Electrophysiologist: Will Meredith Leeds, MD   CC: Pacemaker follow-up  Steven Rosario is a 88 y.o. male seen today for Will Meredith Leeds, MD for routine electrophysiology followup. Since last being seen in our clinic the patient reports doing doing well overall.  he denies chest pain, palpitations, dyspnea, PND, orthopnea, nausea, vomiting, dizziness, syncope, edema, weight gain, or early satiety.   Device History: StMudlogger PPM implanted 2018 for CHB  Past Medical History:  Diagnosis Date   Acute kidney injury (AKI) with acute tubular necrosis (ATN) ([place])    Cervical disc disease    Chronic kidney disease    Coronary artery disease    Gout    Hypertension    Nephrolithiasis    Pneumonia due to COVID-19 virus    Presence of stent in LAD coronary artery    Prostate cancer (HCC)    S/P placement of cardiac pacemaker    Urinary incontinence    Past Surgical History:  Procedure Laterality Date   ANKLE FRACTURE SURGERY Right    cardiac stents x2     caudal epidural injection     CERVICAL DISCECTOMY     COLONOSCOPY     EXPLORATORY LAPAROTOMY     lumbar facet joint injection Bilateral    medical branch block Bilateral    PROSTATECTOMY     RADIOFREQUENCY ABLATION Bilateral    ROTATOR CUFF REPAIR Right    SACROILIAC JOINT ARTHRODESIS Right    SACROILIAC JOINT ARTHRODESIS Bilateral    sacroilliac joint inj     SPIROMETRY      Current Outpatient Medications  Medication Sig Dispense Refill   acetaminophen (TYLENOL) 650 MG CR tablet Take 650 mg by mouth 2 (two) times daily as needed for pain.     amiodarone (PACERONE) 200 MG tablet Take 1 tablet (200 mg total) by mouth daily. 90 tablet 3   amLODipine (NORVASC) 5 MG tablet Take 5 mg by mouth daily.     apixaban (ELIQUIS) 2.5 MG TABS tablet Take 2.5 mg by  mouth 2 (two) times daily.     aspirin 81 MG EC tablet Take 81 mg by mouth daily.     atorvastatin (LIPITOR) 20 MG tablet Take 20 mg by mouth daily.     carvedilol (COREG) 12.5 MG tablet Take 1 tablet (12.5 mg total) by mouth 2 (two) times daily. 60 tablet 11   cyanocobalamin (VITAMIN B12) 1000 MCG tablet Take 1,000 mcg by mouth daily.     FIBER PO Take 1 tablet by mouth daily.     furosemide (LASIX) 40 MG tablet Take 2 tablets (80 mg total) by mouth daily. 180 tablet 3   hydrALAZINE (APRESOLINE) 25 MG tablet Take 1.5 tablets (37.5 mg total) by mouth 3 (three) times daily. 405 tablet 3   isosorbide dinitrate (ISORDIL) 20 MG tablet Take 1 tablet (20 mg total) by mouth 3 (three) times daily. 270 tablet 3   Multiple Vitamins-Minerals (CENTRUM SILVER PO) Take by mouth.     Multiple Vitamins-Minerals (PRESERVISION AREDS 2) CAPS Take 1 capsule by mouth 3 (three) times daily.     pantoprazole (PROTONIX) 20 MG tablet Take 20 mg by mouth daily.     No current facility-administered medications for this visit.    Allergies:   Patient has no known allergies.   Social History: Social History  Socioeconomic History   Marital status: Widowed    Spouse name: Not on file   Number of children: 4   Years of education: Not on file   Highest education level: 8th grade  Occupational History   Occupation: retired   Tobacco Use   Smoking status: Never   Smokeless tobacco: Never  Vaping Use   Vaping Use: Never used  Substance and Sexual Activity   Alcohol use: Not Currently    Comment: past   Drug use: Never   Sexual activity: Not on file  Other Topics Concern   Not on file  Social History Narrative   Not on file   Social Determinants of Health   Financial Resource Strain: Not on file  Food Insecurity: No Food Insecurity (04/05/2022)   Hunger Vital Sign    Worried About Running Out of Food in the Last Year: Never true    Ran Out of Food in the Last Year: Never true  Transportation Needs: No  Transportation Needs (04/05/2022)   PRAPARE - Hydrologist (Medical): No    Lack of Transportation (Non-Medical): No  Physical Activity: Not on file  Stress: Not on file  Social Connections: Not on file  Intimate Partner Violence: Not on file    Family History: Family History  Problem Relation Age of Onset   Diabetes Mother    Obesity Mother    Heart failure Mother    CAD Father    Bone cancer Father    Heart disease Sister    Obesity Brother    Lung cancer Son      Review of Systems: All other systems reviewed and are otherwise negative except as noted above.  Physical Exam: Vitals:   08/08/22 1121  BP: (!) 122/58  Pulse: 70  SpO2: 97%  Weight: 190 lb (86.2 kg)  Height: '5\' 9"'$  (1.753 m)     GEN- The patient is well appearing, alert and oriented x 3 today.   HEENT: normocephalic, atraumatic; sclera clear, conjunctiva pink; hearing intact; oropharynx clear; neck supple, no JVP Lymph- no cervical lymphadenopathy Lungs- Clear to ausculation bilaterally, normal work of breathing.  No wheezes, rales, rhonchi Heart- Regular  rate and rhythm, no murmurs, rubs or gallops, PMI not laterally displaced GI- soft, non-tender, non-distended, bowel sounds present, no hepatosplenomegaly Extremities- no clubbing or cyanosis. No peripheral edema; DP/PT/radial pulses 2+ bilaterally MS- no significant deformity or atrophy Skin- warm and dry, no rash or lesion; PPM pocket well healed Psych- euthymic mood, full affect Neuro- strength and sensation are intact  PPM Interrogation-  reviewed in detail today,  See PACEART report.  EKG:  EKG is not ordered today.  Recent Labs: 04/03/2022: B Natriuretic Peptide 1,524.2 04/17/2022: Hemoglobin 11.8; Platelets 226 07/11/2022: ALT 16; BUN 43; Creatinine, Ser 3.40; Potassium 3.8; Sodium 144; TSH 1.960   Wt Readings from Last 3 Encounters:  08/08/22 190 lb (86.2 kg)  07/11/22 184 lb 12.8 oz (83.8 kg)  04/24/22 192 lb  12.8 oz (87.5 kg)     Other studies Reviewed: Additional studies/ records that were reviewed today include: Previous EP office notes, Previous remote checks, Most recent labwork.     Assessment and Plan:  1. CHB s/p St. Jude PPM  Normal PPM function See Claudia Desanct[facility] Art report No changes today  2. AF, paroxysmal Device today shows pt back in NSR several days after our visit,  Continue amiodarone 200 mg daily  Surveillance labs stable 07/11/22  3. HTN Stable  on current regimen   4. Chronic combined CHF Echo 03/2022 showed LVEF 35-40%, Grade 2 DD Not on ACE/ARb due to CKD Switched from bumex to furosemide and feels much better.   Current medicines are reviewed at length with the patient today.     Disposition:   Follow up with Dr. Curt Bears in 6 months    Signed, Shirley Friar, PA-C  08/08/2022 11:36 AM  [place] [address] [place] [address] [phone] (office) [phone] (fax)

## 2022-08-08 NOTE — Patient Instructions (Signed)
Medication Instructions:  Your physician recommends that you continue on your current medications as directed. Please refer to the Current Medication list given to you today.  *If you need a refill on your cardiac medications before your next appointment, please call your pharmacy*   Lab Work: None If you have labs (blood work) drawn today and your tests are completely normal, you will receive your results only by: [place] (if you have MyChart) OR A paper copy in the mail If you have any lab test that is abnormal or we need to change your treatment, we will call you to review the results.   Follow-Up: At [facility], you and your health needs are our priority.  As part of our continuing mission to provide you with exceptional heart care, we have created designated Provider Care Teams.  These Care Teams include your primary Cardiologist (physician) and Advanced Practice Providers (APPs -  Physician Assistants and Nurse Practitioners) who all work together to provide you with the care you need, when you need it.   Your next appointment:   6 month(s)  The format for your next appointment:   In Person  Provider:   Allegra Lai, MD    Important Information About Sugar

## 2022-08-13 ENCOUNTER — Ambulatory Visit (INDEPENDENT_AMBULATORY_CARE_PROVIDER_SITE_OTHER): Payer: Medicare HMO

## 2022-08-13 DIAGNOSIS — I442 Atrioventricular block, complete: Secondary | ICD-10-CM | POA: Diagnosis not present

## 2022-08-13 LAB — CUP PACEART REMOTE DEVICE CHECK
Battery Remaining Longevity: 50 mo
Battery Remaining Percentage: 47 %
Battery Voltage: 2.96 V
Brady Statistic AP VP Percent: 99 %
Brady Statistic AP VS Percent: 1 %
Brady Statistic AS VP Percent: 1 %
Brady Statistic AS VS Percent: 1 %
Brady Statistic RA Percent Paced: 99 %
Brady Statistic RV Percent Paced: 99 %
Date Time Interrogation Session: 20231113020014
Implantable Lead Connection Status: 753985
Implantable Lead Connection Status: 753985
Implantable Lead Implant Date: 20181123
Implantable Lead Implant Date: 20181123
Implantable Lead Location: 753859
Implantable Lead Location: 753860
Implantable Pulse Generator Implant Date: 20181123
Lead Channel Impedance Value: 400 Ohm
Lead Channel Impedance Value: 400 Ohm
Lead Channel Pacing Threshold Amplitude: 0.875 V
Lead Channel Pacing Threshold Amplitude: 0.875 V
Lead Channel Pacing Threshold Pulse Width: 0.5 ms
Lead Channel Pacing Threshold Pulse Width: 0.5 ms
Lead Channel Sensing Intrinsic Amplitude: 1.3 mV
Lead Channel Sensing Intrinsic Amplitude: 12 mV
Lead Channel Setting Pacing Amplitude: 1.125
Lead Channel Setting Pacing Amplitude: 1.875
Lead Channel Setting Pacing Pulse Width: 0.5 ms
Lead Channel Setting Sensing Sensitivity: 2 mV
Pulse Gen Model: 2272
Pulse Gen Serial Number: 8957038

## 2022-08-27 DIAGNOSIS — R0981 Nasal congestion: Secondary | ICD-10-CM | POA: Diagnosis not present

## 2022-08-27 DIAGNOSIS — R509 Fever, unspecified: Secondary | ICD-10-CM | POA: Diagnosis not present

## 2022-08-27 DIAGNOSIS — R051 Acute cough: Secondary | ICD-10-CM | POA: Diagnosis not present

## 2022-08-27 DIAGNOSIS — R5383 Other fatigue: Secondary | ICD-10-CM | POA: Diagnosis not present

## 2022-08-30 DIAGNOSIS — R490 Dysphonia: Secondary | ICD-10-CM | POA: Diagnosis not present

## 2022-08-31 DIAGNOSIS — J208 Acute bronchitis due to other specified organisms: Secondary | ICD-10-CM | POA: Diagnosis not present

## 2022-08-31 DIAGNOSIS — E785 Hyperlipidemia, unspecified: Secondary | ICD-10-CM | POA: Diagnosis not present

## 2022-08-31 DIAGNOSIS — I129 Hypertensive chronic kidney disease with stage 1 through stage 4 chronic kidney disease, or unspecified chronic kidney disease: Secondary | ICD-10-CM | POA: Diagnosis not present

## 2022-08-31 DIAGNOSIS — N184 Chronic kidney disease, stage 4 (severe): Secondary | ICD-10-CM | POA: Diagnosis not present

## 2022-08-31 DIAGNOSIS — N179 Acute kidney failure, unspecified: Secondary | ICD-10-CM | POA: Diagnosis not present

## 2022-08-31 DIAGNOSIS — I4901 Ventricular fibrillation: Secondary | ICD-10-CM | POA: Diagnosis not present

## 2022-08-31 DIAGNOSIS — U071 COVID-19: Secondary | ICD-10-CM | POA: Diagnosis not present

## 2022-08-31 DIAGNOSIS — K219 Gastro-esophageal reflux disease without esophagitis: Secondary | ICD-10-CM | POA: Diagnosis not present

## 2022-08-31 DIAGNOSIS — E860 Dehydration: Secondary | ICD-10-CM | POA: Diagnosis not present

## 2022-08-31 DIAGNOSIS — I2510 Atherosclerotic heart disease of native coronary artery without angina pectoris: Secondary | ICD-10-CM | POA: Diagnosis not present

## 2022-08-31 DIAGNOSIS — R059 Cough, unspecified: Secondary | ICD-10-CM | POA: Diagnosis not present

## 2022-09-01 DIAGNOSIS — N184 Chronic kidney disease, stage 4 (severe): Secondary | ICD-10-CM | POA: Diagnosis not present

## 2022-09-01 DIAGNOSIS — I129 Hypertensive chronic kidney disease with stage 1 through stage 4 chronic kidney disease, or unspecified chronic kidney disease: Secondary | ICD-10-CM | POA: Diagnosis not present

## 2022-09-01 DIAGNOSIS — E860 Dehydration: Secondary | ICD-10-CM | POA: Diagnosis not present

## 2022-09-01 DIAGNOSIS — J208 Acute bronchitis due to other specified organisms: Secondary | ICD-10-CM | POA: Diagnosis not present

## 2022-09-01 DIAGNOSIS — I2510 Atherosclerotic heart disease of native coronary artery without angina pectoris: Secondary | ICD-10-CM | POA: Diagnosis not present

## 2022-09-01 DIAGNOSIS — U071 COVID-19: Secondary | ICD-10-CM | POA: Diagnosis not present

## 2022-09-01 DIAGNOSIS — E785 Hyperlipidemia, unspecified: Secondary | ICD-10-CM | POA: Diagnosis not present

## 2022-09-01 DIAGNOSIS — N179 Acute kidney failure, unspecified: Secondary | ICD-10-CM | POA: Diagnosis not present

## 2022-09-01 DIAGNOSIS — K219 Gastro-esophageal reflux disease without esophagitis: Secondary | ICD-10-CM | POA: Diagnosis not present

## 2022-09-02 DIAGNOSIS — N179 Acute kidney failure, unspecified: Secondary | ICD-10-CM | POA: Diagnosis not present

## 2022-09-02 DIAGNOSIS — U071 COVID-19: Secondary | ICD-10-CM | POA: Diagnosis not present

## 2022-09-02 DIAGNOSIS — N184 Chronic kidney disease, stage 4 (severe): Secondary | ICD-10-CM | POA: Diagnosis not present

## 2022-09-10 DIAGNOSIS — N189 Chronic kidney disease, unspecified: Secondary | ICD-10-CM | POA: Diagnosis not present

## 2022-09-10 DIAGNOSIS — J329 Chronic sinusitis, unspecified: Secondary | ICD-10-CM | POA: Diagnosis not present

## 2022-09-10 DIAGNOSIS — J40 Bronchitis, not specified as acute or chronic: Secondary | ICD-10-CM | POA: Diagnosis not present

## 2022-09-10 DIAGNOSIS — Z6826 Body mass index (BMI) 26.0-26.9, adult: Secondary | ICD-10-CM | POA: Diagnosis not present

## 2022-09-10 DIAGNOSIS — N179 Acute kidney failure, unspecified: Secondary | ICD-10-CM | POA: Diagnosis not present

## 2022-09-13 DIAGNOSIS — R609 Edema, unspecified: Secondary | ICD-10-CM | POA: Diagnosis not present

## 2022-09-13 DIAGNOSIS — N179 Acute kidney failure, unspecified: Secondary | ICD-10-CM | POA: Diagnosis not present

## 2022-09-13 DIAGNOSIS — N184 Chronic kidney disease, stage 4 (severe): Secondary | ICD-10-CM | POA: Diagnosis not present

## 2022-09-13 DIAGNOSIS — D631 Anemia in chronic kidney disease: Secondary | ICD-10-CM | POA: Diagnosis not present

## 2022-09-13 DIAGNOSIS — N2581 Secondary hyperparathyroidism of renal origin: Secondary | ICD-10-CM | POA: Diagnosis not present

## 2022-09-13 DIAGNOSIS — I509 Heart failure, unspecified: Secondary | ICD-10-CM | POA: Diagnosis not present

## 2022-09-13 DIAGNOSIS — I130 Hypertensive heart and chronic kidney disease with heart failure and stage 1 through stage 4 chronic kidney disease, or unspecified chronic kidney disease: Secondary | ICD-10-CM | POA: Diagnosis not present

## 2022-09-17 NOTE — Progress Notes (Signed)
Remote pacemaker transmission.   

## 2022-10-18 DIAGNOSIS — I4891 Unspecified atrial fibrillation: Secondary | ICD-10-CM | POA: Diagnosis not present

## 2022-10-18 DIAGNOSIS — J9801 Acute bronchospasm: Secondary | ICD-10-CM | POA: Diagnosis not present

## 2022-10-18 DIAGNOSIS — L989 Disorder of the skin and subcutaneous tissue, unspecified: Secondary | ICD-10-CM | POA: Diagnosis not present

## 2022-10-18 DIAGNOSIS — Z6827 Body mass index (BMI) 27.0-27.9, adult: Secondary | ICD-10-CM | POA: Diagnosis not present

## 2022-10-31 ENCOUNTER — Other Ambulatory Visit: Payer: Self-pay

## 2022-10-31 ENCOUNTER — Emergency Department (HOSPITAL_COMMUNITY): Payer: Medicare HMO

## 2022-10-31 ENCOUNTER — Inpatient Hospital Stay (HOSPITAL_COMMUNITY)
Admission: EM | Admit: 2022-10-31 | Discharge: 2022-11-02 | DRG: 291 | Disposition: A | Discharge status: Home Health | Payer: Medicare HMO | Attending: Student in an Organized Health Care Education/Training Program | Admitting: Student in an Organized Health Care Education/Training Program

## 2022-10-31 DIAGNOSIS — Z8616 Personal history of COVID-19: Secondary | ICD-10-CM

## 2022-10-31 DIAGNOSIS — Z950 Presence of cardiac pacemaker: Secondary | ICD-10-CM

## 2022-10-31 DIAGNOSIS — I509 Heart failure, unspecified: Secondary | ICD-10-CM | POA: Diagnosis not present

## 2022-10-31 DIAGNOSIS — I2510 Atherosclerotic heart disease of native coronary artery without angina pectoris: Secondary | ICD-10-CM | POA: Diagnosis present

## 2022-10-31 DIAGNOSIS — Z8249 Family history of ischemic heart disease and other diseases of the circulatory system: Secondary | ICD-10-CM | POA: Diagnosis not present

## 2022-10-31 DIAGNOSIS — I5043 Acute on chronic combined systolic (congestive) and diastolic (congestive) heart failure: Secondary | ICD-10-CM | POA: Diagnosis present

## 2022-10-31 DIAGNOSIS — I130 Hypertensive heart and chronic kidney disease with heart failure and stage 1 through stage 4 chronic kidney disease, or unspecified chronic kidney disease: Secondary | ICD-10-CM | POA: Diagnosis not present

## 2022-10-31 DIAGNOSIS — I480 Paroxysmal atrial fibrillation: Secondary | ICD-10-CM | POA: Diagnosis present

## 2022-10-31 DIAGNOSIS — J90 Pleural effusion, not elsewhere classified: Secondary | ICD-10-CM | POA: Diagnosis not present

## 2022-10-31 DIAGNOSIS — Z7982 Long term (current) use of aspirin: Secondary | ICD-10-CM

## 2022-10-31 DIAGNOSIS — J9811 Atelectasis: Secondary | ICD-10-CM | POA: Diagnosis not present

## 2022-10-31 DIAGNOSIS — N179 Acute kidney failure, unspecified: Secondary | ICD-10-CM | POA: Diagnosis not present

## 2022-10-31 DIAGNOSIS — E876 Hypokalemia: Secondary | ICD-10-CM | POA: Diagnosis present

## 2022-10-31 DIAGNOSIS — R0602 Shortness of breath: Secondary | ICD-10-CM | POA: Diagnosis not present

## 2022-10-31 DIAGNOSIS — I10 Essential (primary) hypertension: Secondary | ICD-10-CM | POA: Diagnosis present

## 2022-10-31 DIAGNOSIS — I5023 Acute on chronic systolic (congestive) heart failure: Secondary | ICD-10-CM

## 2022-10-31 DIAGNOSIS — R0609 Other forms of dyspnea: Secondary | ICD-10-CM | POA: Diagnosis present

## 2022-10-31 DIAGNOSIS — N184 Chronic kidney disease, stage 4 (severe): Secondary | ICD-10-CM | POA: Diagnosis present

## 2022-10-31 DIAGNOSIS — Z79899 Other long term (current) drug therapy: Secondary | ICD-10-CM

## 2022-10-31 DIAGNOSIS — Z515 Encounter for palliative care: Secondary | ICD-10-CM | POA: Diagnosis not present

## 2022-10-31 DIAGNOSIS — Z66 Do not resuscitate: Secondary | ICD-10-CM | POA: Diagnosis not present

## 2022-10-31 DIAGNOSIS — E1122 Type 2 diabetes mellitus with diabetic chronic kidney disease: Secondary | ICD-10-CM | POA: Diagnosis present

## 2022-10-31 DIAGNOSIS — I5031 Acute diastolic (congestive) heart failure: Secondary | ICD-10-CM | POA: Diagnosis not present

## 2022-10-31 DIAGNOSIS — Z8546 Personal history of malignant neoplasm of prostate: Secondary | ICD-10-CM

## 2022-10-31 DIAGNOSIS — Z955 Presence of coronary angioplasty implant and graft: Secondary | ICD-10-CM | POA: Diagnosis not present

## 2022-10-31 DIAGNOSIS — Z801 Family history of malignant neoplasm of trachea, bronchus and lung: Secondary | ICD-10-CM | POA: Diagnosis not present

## 2022-10-31 DIAGNOSIS — Z7901 Long term (current) use of anticoagulants: Secondary | ICD-10-CM

## 2022-10-31 DIAGNOSIS — I499 Cardiac arrhythmia, unspecified: Secondary | ICD-10-CM | POA: Insufficient documentation

## 2022-10-31 DIAGNOSIS — Z833 Family history of diabetes mellitus: Secondary | ICD-10-CM

## 2022-10-31 DIAGNOSIS — N189 Chronic kidney disease, unspecified: Secondary | ICD-10-CM | POA: Diagnosis not present

## 2022-10-31 DIAGNOSIS — I472 Ventricular tachycardia, unspecified: Secondary | ICD-10-CM | POA: Diagnosis present

## 2022-10-31 LAB — CBC
HCT: 33.1 % — ABNORMAL LOW (ref 39.0–52.0)
Hemoglobin: 10.1 g/dL — ABNORMAL LOW (ref 13.0–17.0)
MCH: 26 pg (ref 26.0–34.0)
MCHC: 30.5 g/dL (ref 30.0–36.0)
MCV: 85.3 fL (ref 80.0–100.0)
Platelets: 209 10*3/uL (ref 150–400)
RBC: 3.88 MIL/uL — ABNORMAL LOW (ref 4.22–5.81)
RDW: 17 % — ABNORMAL HIGH (ref 11.5–15.5)
WBC: 6 10*3/uL (ref 4.0–10.5)
nRBC: 0 % (ref 0.0–0.2)

## 2022-10-31 LAB — BASIC METABOLIC PANEL
Anion gap: 12 (ref 5–15)
BUN: 40 mg/dL — ABNORMAL HIGH (ref 8–23)
CO2: 19 mmol/L — ABNORMAL LOW (ref 22–32)
Calcium: 8.5 mg/dL — ABNORMAL LOW (ref 8.9–10.3)
Chloride: 110 mmol/L (ref 98–111)
Creatinine, Ser: 3.19 mg/dL — ABNORMAL HIGH (ref 0.61–1.24)
GFR, Estimated: 18 mL/min — ABNORMAL LOW (ref >60)
Glucose, Bld: 113 mg/dL — ABNORMAL HIGH (ref 70–99)
Potassium: 3.5 mmol/L (ref 3.5–5.1)
Sodium: 141 mmol/L (ref 135–145)

## 2022-10-31 LAB — TROPONIN I (HIGH SENSITIVITY)
Troponin I (High Sensitivity): 64 ng/L — ABNORMAL HIGH (ref <18)
Troponin I (High Sensitivity): 62 ng/L — ABNORMAL HIGH (ref <18)

## 2022-10-31 LAB — BRAIN NATRIURETIC PEPTIDE: B Natriuretic Peptide: 1761.5 pg/mL — ABNORMAL HIGH (ref 0.0–100.0)

## 2022-10-31 MED ORDER — VITAMIN D 25 MCG (1000 UNIT) PO TABS
1000.0000 [IU] | ORAL_TABLET | Freq: Every day | ORAL | Status: DC
  Administered 2022-10-31 – 2022-11-02 (×3): 1000 [IU] via ORAL
  Filled 2022-10-31 (×3): qty 1

## 2022-10-31 MED ORDER — APIXABAN 2.5 MG PO TABS
2.5000 mg | ORAL_TABLET | Freq: Two times a day (BID) | ORAL | Status: DC
  Administered 2022-10-31 – 2022-11-02 (×5): 2.5 mg via ORAL
  Filled 2022-10-31 (×5): qty 1

## 2022-10-31 MED ORDER — ASPIRIN 81 MG PO TBEC
81.0000 mg | DELAYED_RELEASE_TABLET | Freq: Every day | ORAL | Status: DC
  Administered 2022-10-31 – 2022-11-02 (×3): 81 mg via ORAL
  Filled 2022-10-31 (×3): qty 1

## 2022-10-31 MED ORDER — AMIODARONE HCL 200 MG PO TABS
200.0000 mg | ORAL_TABLET | Freq: Every day | ORAL | Status: DC
  Administered 2022-10-31 – 2022-11-02 (×3): 200 mg via ORAL
  Filled 2022-10-31 (×3): qty 1

## 2022-10-31 MED ORDER — ACETAMINOPHEN 325 MG PO TABS: 650.0000 mg | ORAL_TABLET | Freq: Four times a day (QID) | ORAL | Status: DC | PRN

## 2022-10-31 MED ORDER — ACETAMINOPHEN 650 MG RE SUPP: 650.0000 mg | Freq: Four times a day (QID) | RECTAL | Status: DC | PRN

## 2022-10-31 MED ORDER — ATORVASTATIN CALCIUM 10 MG PO TABS
20.0000 mg | ORAL_TABLET | Freq: Every day | ORAL | Status: DC
  Administered 2022-10-31 – 2022-11-02 (×3): 20 mg via ORAL
  Filled 2022-10-31 (×3): qty 2

## 2022-10-31 MED ORDER — FUROSEMIDE 10 MG/ML IJ SOLN
80.0000 mg | Freq: Once | INTRAMUSCULAR | Status: AC
  Administered 2022-10-31: 80 mg via INTRAVENOUS
  Filled 2022-10-31: qty 8

## 2022-10-31 MED ORDER — PANTOPRAZOLE SODIUM 20 MG PO TBEC
20.0000 mg | DELAYED_RELEASE_TABLET | Freq: Every day | ORAL | Status: DC
  Administered 2022-10-31 – 2022-11-02 (×3): 20 mg via ORAL
  Filled 2022-10-31 (×3): qty 1

## 2022-10-31 NOTE — ED Provider Triage Note (Signed)
Emergency Medicine Provider Triage Evaluation Note  Steven Rosario , a 89 y.o. male  was evaluated in triage.  Pt complains of shortness of breath, worse with exertion for the last two days. Denies significant chest pain. Reports some increased swelling in feet and legs. Reports apneic episodes, feeling like he can't breath intermittently. Denies cough, fever, chills.  Review of Systems  Positive: shob Negative: Chest pain  Physical Exam  BP (!) 155/70   Pulse (!) 59   Temp 98.4 F (36.9 C) (Oral)   Resp 18   Ht '5\' 8"'$  (1.727 m)   Wt 82.6 kg   SpO2 91%   BMI 27.67 kg/m  Gen:   Awake, no distress   Resp:  Normal effort  MSK:   Moves extremities without difficulty  Other:  Mild swelling bil lower extremities  Medical Decision Making  Medically screening exam initiated at 9:46 AM.  Appropriate orders placed.  Steven Rosario was informed that the remainder of the evaluation will be completed by another provider, this initial triage assessment does not replace that evaluation, and the importance of remaining in the ED until their evaluation is complete.  Workup initiated in triage    Anselmo Pickler, [place] 10/31/22 9244

## 2022-10-31 NOTE — H&P (Cosign Needed Addendum)
Date: 10/31/2022         Patient Name:  Steven Rosario MRN: [id number]  DOB: 1929-09-12 Age / Sex: 89 y.o., male   PCP: Angelina Sheriff, MD         Medical Service: Internal Medicine Teaching Service         Attending Physician: Dr. Evette Doffing, Mallie Mussel, *    First Contact: Dr. Carin Primrose Pager: [phone]  Second Contact: Dr. Raymondo Band Pager: [phone]       After Hours (After 5p/  First Contact Pager: [phone]  weekends / holidays): Second Contact Pager: [phone]   Chief Concern:  Shortness of breath  History of Present Illness:  Steven Rosario is a 89 yo male with hx of prostate cancer, CAD s/p 2 stents, s/p St Jude PPM, paroxysmal Afib on eliquis, hypertension, CKD stage 4, CHF (EF 35-40%) who presents with SOB.  This patient is concerned about 2-3 days of worsening dyspnea on exertion. The patient describes working in the garage and becoming short of breath, which is unusual for him. Later, he laid flat and he could not catch his breath and his son heard him wheezing across the room. Preceding these symptoms for a few days, he has felt really weak which is unusual for him. The patient has also had a productive cough - mucus was light and now it is dark yellow. He also notes he has a chronic cough, but it is not usually productive of mucus. He reports some weight gain over the last few days. Occasionally he has chest pain, but this isn't unusual for him and doesn't seem to be associated with his current symptoms. He doesn't have any chest pain or shortness of breath at the time of this interview. Denies any fevers, chills, night sweats, chest pain/pressure, n/v/d, abdominal distention, urinary symptoms (including increased or decreased frequency of urination). He does endorse frequent nocturia, but this is unchanged from usual. The patient notes his right leg is chronically more swollen than the left and he has not noticed any worsening of this.   Review of Systems  Constitutional:   Negative for chills and fever.  HENT:  Negative for sore throat.   Respiratory:  Positive for cough, sputum production and shortness of breath.   Cardiovascular:  Positive for orthopnea. Negative for chest pain and palpitations.  Gastrointestinal:  Negative for abdominal pain, diarrhea, nausea and vomiting.  Genitourinary:  Negative for dysuria.   ED course notable for treatment with 80 mg of IV furosemide.  Medications:  Amiodarone 200 mg daily Eliquis 2.5 mg bid Aspirin 81 mg daily Atorvastatin 20 mg daily Coreg 12.5 mg bid Lasix 40 mg daily Hydralazine 37.5 mg tid Isordil 30 mg tid Prtonoix 20 mg daily  Allergies: No allergies  Past Medical History: Prostate cancer Congestive heart failure Atrial fibrillation Bradycardia status post pacemaker Coronary artery disease status post coronary artery stenting  Surgical History: Prostatectomy, Pacemaker, stents, intestinal surgery (folded)  Family History:  Noncontributory  Social History:  Has 3 sons but the patient lives alone. Independent of ADL and iADLs. Wife passed away 5 years ago, married 67 years. Goes to church Wednesday and Sundays, Fridays goes to [employer].  PCP: Angelina Sheriff, MD No tobacco, alcohol, or illicit drug use.   Physical Exam: Blood pressure (!) 152/77, pulse 63, temperature 97.6 F (36.4 C), temperature source Oral, resp. rate 18, height '5\' 8"'$  (1.727 m), weight 82.6 kg, SpO2 99 %.  Comfortable appearing Oral mucous  membranes are moist Heart rate and rhythm are regular, radial pulses 2+ bilaterally, trace pitting edema in bilateral lower extremities, hepatojugular reflux present with jugular venous pulsations to the mandible Breathing is regular and unlabored, no wheezing or crackles noted Abdomen is soft, nontender, nondistended Skin is warm and dry without jaundice Alert and oriented, speech is fluent, no gross focal neurologic deficits observed Pleasant, mood and affect are  concordant  EKG:  Paced rhythm, rate of approximately 60 bpm, prolonged QT interval, no significant change compared to prior EKGs  Labs:  CO2 19 Anion gap 12 BUN/creatinine 40/3.19 BNP 1761.5 Troponin 64, 62 WBC 6.0 Hemoglobin 10.1  Images and other studies: Chest x-ray with right-sided pleural effusion and some bilateral airspace opacities, not significantly changed from from July 2023 study.    Assessment & Plan  [name] is a 89 y.o. with chronic systolic and diastolic heart failure and CKD stage G4 who presents with a few days of worsening dyspnea on exertion and orthopnea with some signs of volume overload on exam.  Principal Problem:   Acute exacerbation of CHF (congestive heart failure) ([place])  Acute decompensated heart failure Patient is hemodynamically stable at this time.  Has signs of volume overload on exam.  Does not appear to be in a low output state.  He does not require supplemental oxygen.  The trigger for this episode is unknown.  I do not suspect ACS given unremarkable EKG and troponin.  Given his weakness in the days preceding and the increased cough it is possible he has a mild respiratory virus.  I did note on his telemetry that he was in a tachyarrhythmia briefly while assessing him.  Given his reduced EF I will interrogate his pacemaker to evaluate paroxysmal tachyarrhythmia.  Considered pneumonia given his cough with sputum production but he has no other symptoms suggestive of infection and no clear radiographic signs of pneumonia.  Status post 80 mg of IV Lasix.  Will assess his response to diuresis.  Echo to assess EF.  Will mobilize with therapy to see how his oxygen levels do with activity.  Holding antihypertensives while diuresing.  Hopeful for a brief hospital stay before returning home for this patient with highly independent lifestyle. - A.m. BMP - Monitor urine output - Echocardiogram - Pacemaker interrogation - Monitor on telemetry - Hold home  carvedilol, Imdur, hydralazine  AKI on CKD G4 Follows with [facility] in outpatient setting.  Estimated GFR down to 18 from around 24 in July.  In setting of decompensated heart failure suspect an element of cardiorenal syndrome.  May also be progressive course of his CKD.  He has signs of hypervolemia on exam.  His mucous membranes are moist.  Trend creatinine response to diuresis.  Atrial fibrillation History of paroxysmal A-fib.  Continue home antiarrhythmics and anticoagulation. - Amiodarone 200 mg p.o. daily - Apixaban 2.5 mg p.o. daily  Coronary artery disease Status post coronary artery stenting. Continuing home medications. - Aspirin 81 mg daily - Atorvastatin 20 mg daily  Diet: Regular IVF: None VTE: Therapeutic apixaban Code: DNR Surrogate: Son, Jomari Bartnik  Admit patient to Observation with expected length of stay less than 2 midnights.  Signed: [name] 10/31/2022, 3:16 PM  Pager: [phone] After 5pm on weekdays and 1pm on weekends: [phone]

## 2022-10-31 NOTE — ED Notes (Signed)
Assisted pt with getting cell phone onto wifi network

## 2022-10-31 NOTE — ED Provider Notes (Signed)
[facility] Provider Note   CSN: [id number] Arrival date & time: 10/31/22  0930     History  Chief Complaint  Patient presents with   Shortness of Breath    Steven Rosario is a 89 y.o. male.  Patient is a 89 year old male with a history of CAD, CKD, CHF currently on lasix daily status post pacemaker placement who is presenting today with complaint of shortness of breath.  Patient reports that over the last few days he has noticed some worsening shortness of breath but in the last 12 hours has become severe.  Now with any activity he feels very short of breath and he has to sit and rest for a while before he can catch his breath.  Also complaining of orthopnea.  He has been following his weight and reports he is up about 5 pounds in the last 4 to 5 days.  He has had no change in his medication but his son is present in the room and reports he does eat out a lot and thinks that it is most likely coming from dietary causes.  Patient does report he has some intermittent chest pain but denies any currently.  He has no abdominal pain nausea vomiting.  No productive cough, fever.  He has noticed a little bit of swelling in his legs but nothing severe.  The history is provided by the patient, a relative and medical records.  Shortness of Breath      Home Medications Prior to Admission medications   Medication Sig Start Date End Date Taking? Authorizing Provider  acetaminophen (TYLENOL) 650 MG CR tablet Take 650 mg by mouth 2 (two) times daily as needed for pain.    [provider]  amiodarone (PACERONE) 200 MG tablet Take 1 tablet (200 mg total) by mouth daily. 07/18/22   Camnitz, Will Hassell Done, MD  amLODipine (NORVASC) 5 MG tablet Take 5 mg by mouth daily. 03/29/22   [provider]  apixaban (ELIQUIS) 2.5 MG TABS tablet Take 2.5 mg by mouth 2 (two) times daily.    [provider]  aspirin 81 MG EC tablet Take 81 mg by mouth  daily.    [provider]  atorvastatin (LIPITOR) 20 MG tablet Take 20 mg by mouth daily.    [provider]  carvedilol (COREG) 12.5 MG tablet Take 1 tablet (12.5 mg total) by mouth 2 (two) times daily. 07/17/21   Camnitz, Ocie Doyne, MD  cyanocobalamin (VITAMIN B12) 1000 MCG tablet Take 1,000 mcg by mouth daily.    [provider]  FIBER PO Take 1 tablet by mouth daily.    [provider]  furosemide (LASIX) 40 MG tablet Take 2 tablets (80 mg total) by mouth daily. 05/30/22   Camnitz, Ocie Doyne, MD  hydrALAZINE (APRESOLINE) 25 MG tablet Take 1.5 tablets (37.5 mg total) by mouth 3 (three) times daily. 05/30/22   Camnitz, Ocie Doyne, MD  isosorbide dinitrate (ISORDIL) 20 MG tablet Take 1 tablet (20 mg total) by mouth 3 (three) times daily. 05/30/22   Camnitz, Ocie Doyne, MD  Multiple Vitamins-Minerals (CENTRUM SILVER PO) Take by mouth.    [provider]  Multiple Vitamins-Minerals (PRESERVISION AREDS 2) CAPS Take 1 capsule by mouth 3 (three) times daily.    [provider]  pantoprazole (PROTONIX) 20 MG tablet Take 20 mg by mouth daily.    [provider]      Allergies    Patient has no  known allergies.    Review of Systems   Review of Systems  Respiratory:  Positive for shortness of breath.     Physical Exam Updated Vital Signs BP (!) 152/77   Pulse 63   Temp 98.4 F (36.9 C) (Oral)   Resp 18   Ht '5\' 8"'$  (1.727 m)   Wt 82.6 kg   SpO2 99%   BMI 27.67 kg/m  Physical Exam Vitals and nursing note reviewed.  Constitutional:      General: He is not in acute distress.    Appearance: He is well-developed.  HENT:     Head: Normocephalic and atraumatic.  Eyes:     Conjunctiva/sclera: Conjunctivae normal.     Pupils: Pupils are equal, round, and reactive to light.  Cardiovascular:     Rate and Rhythm: Normal rate and regular rhythm.     Heart sounds: No murmur heard. Pulmonary:     Effort: Pulmonary effort is  normal. Tachypnea present. No respiratory distress.     Breath sounds: Examination of the right-middle field reveals decreased breath sounds. Examination of the right-lower field reveals decreased breath sounds. Decreased breath sounds present. No wheezing or rales.  Abdominal:     General: There is no distension.     Palpations: Abdomen is soft.     Tenderness: There is no abdominal tenderness. There is no guarding or rebound.  Musculoskeletal:        General: No tenderness. Normal range of motion.     Cervical back: Normal range of motion and neck supple.     Right lower leg: Edema present.     Left lower leg: Edema present.     Comments: Trace edema bilaterally  Skin:    General: Skin is warm and dry.     Findings: No erythema or rash.  Neurological:     Mental Status: He is alert and oriented to person, place, and time. Mental status is at baseline.  Psychiatric:        Behavior: Behavior normal.     ED Results / Procedures / Treatments   Labs (all labs ordered are listed, but only abnormal results are displayed) Labs Reviewed  BASIC METABOLIC PANEL - Abnormal; Notable for the following components:      Result Value   CO2 19 (*)    Glucose, Bld 113 (*)    BUN 40 (*)    Creatinine, Ser 3.19 (*)    Calcium 8.5 (*)    GFR, Estimated 18 (*)    All other components within normal limits  CBC - Abnormal; Notable for the following components:   RBC 3.88 (*)    Hemoglobin 10.1 (*)    HCT 33.1 (*)    RDW 17.0 (*)    All other components within normal limits  BRAIN NATRIURETIC PEPTIDE - Abnormal; Notable for the following components:   B Natriuretic Peptide 1,761.5 (*)    All other components within normal limits  TROPONIN I (HIGH SENSITIVITY) - Abnormal; Notable for the following components:   Troponin I (High Sensitivity) 64 (*)    All other components within normal limits  TROPONIN I (HIGH SENSITIVITY)    EKG EKG Interpretation  Date/Time:  Wednesday October 31 2022  09:28:51 EST Ventricular Rate:  63 PR Interval:  210 QRS Duration: 168 QT Interval:  506 QTC Calculation: 517 R Axis:   -87 Text Interpretation: AV dual-paced rhythm with prolonged AV conduction When compared with ECG of 03-Apr-2022 02:48, PREVIOUS ECG IS PRESENT No  significant change since last tracing Confirmed by Blanchie Dessert [id number]) on 10/31/2022 11:08:57 AM  Radiology DG Chest 2 View  Result Date: 10/31/2022 CLINICAL DATA:  Onset shortness of breath yesterday. EXAM: CHEST - 2 VIEW COMPARISON:  Single-view of the chest 08/31/2022 and 04/05/2022. FINDINGS: The patient has a moderate right pleural effusion with basilar airspace disease. Trace left pleural effusion and mild basilar atelectasis are also seen. Heart size is normal. No pneumothorax. Pacing device in place. No acute or focal bony abnormality. IMPRESSION: Moderate right pleural effusion with basilar airspace disease which could be due to atelectasis or pneumonia. Trace left pleural effusion also noted. Electronically Signed   By: Inge Rise M.D.   On: 10/31/2022 10:52    Procedures Procedures    Medications Ordered in ED Medications  furosemide (LASIX) injection 80 mg (has no administration in time range)    ED Course/ Medical Decision Making/ A&P                             Medical Decision Making Amount and/or Complexity of Data Reviewed Independent Historian: caregiver External Data Reviewed: notes.    Details: Prior hospitalization Labs: ordered. Decision-making details documented in ED Course. Radiology: ordered and independent interpretation performed. Decision-making details documented in ED Course. ECG/medicine tests: ordered and independent interpretation performed. Decision-making details documented in ED Course.  Risk Prescription drug management. Decision regarding hospitalization.   Pt with multiple medical problems and comorbidities and presenting today with a complaint that caries a high  risk for morbidity and mortality.  Here today with worsening shortness of breath.  DOE, orthopnea, 5 pound weight gain in the last 4 to 5 days.  Concern for CHF exacerbation.  Also patient has a history of chronic kidney disease and concern for new acute kidney injury.  He does take lasix 40 mg daily and has been compliant with that medication.  He denies any infectious symptoms and lower suspicion for pneumonia, PE, dissection.  Currently sitting up in the bed O2 sats are normal. I independently interpreted patient's labs and EKG today.  CBC with persistent anemia unchanged with hemoglobin of 10, normal white count and platelet count, troponin at 64 most likely related to strain and BMP with normal sodium and potassium but creatinine of 3.19 which on last evaluation was 3.4 in October.  Last echo done in 723 showed an EF of 35 to 40%.  At that time he was switched from Bumex to Lasix.  He reports currently he is on 40 mg of Lasix. Patient's BNP today is 1700.  He was given 80 mg of IV Lasix.  Feel that he needs admission for diuresis given his significant shortness of breath with any activity.  Findings discussed with the patient and his son.  They are comfortable with this plan.  Patient admitted to the hospitalist service.  CRITICAL CARE Performed by: Jamin Panther Total critical care time: 30 minutes Critical care time was exclusive of separately billable procedures and treating other patients. Critical care was necessary to treat or prevent imminent or life-threatening deterioration. Critical care was time spent personally by me on the following activities: development of treatment plan with patient and/or surrogate as well as nursing, discussions with consultants, evaluation of patient's response to treatment, examination of patient, obtaining history from patient or surrogate, ordering and performing treatments and interventions, ordering and review of laboratory studies, ordering and review of  radiographic studies, pulse oximetry and re-evaluation of  patient's condition.         Final Clinical Impression(s) / ED Diagnoses Final diagnoses:  Acute on chronic congestive heart failure, unspecified heart failure type ([place])  Chronic kidney disease, unspecified CKD stage    Rx / DC Orders ED Discharge Orders     None         Blanchie Dessert, MD 10/31/22 1203

## 2022-10-31 NOTE — ED Notes (Signed)
ED TO INPATIENT HANDOFF REPORT  ED Nurse Name and Phone #: cori, [place] Name/Age/Gender Steven Rosario 89 y.o. male Room/Bed: 038C/038C  Code Status   Code Status: DNR  Home/SNF/Other Home Patient oriented to: self, place, time, and situation Is this baseline? Yes   Triage Complete: Triage complete  Chief Complaint Acute exacerbation of CHF (congestive heart failure) ([place]) [I50.9]  Triage Note Pt. Stated, I can't hardly breath and sometimes Im not breathing at all. This started yesterday and I have CHF and swelling in my legs and feet. .    Allergies No Known Allergies  Level of Care/Admitting Diagnosis ED Disposition     ED Disposition  Admit   Condition  --   Comment  Hospital Area: [facility] [[id number]]  Level of Care: Telemetry Medical [104]  May place patient in observation at [facility] or [facility] if equivalent level of care is available:: No  Covid Evaluation: Asymptomatic - no recent exposure (last 10 days) testing not required  Diagnosis: Acute exacerbation of CHF (congestive heart failure) [facility]) [[id number]]  Admitting Physician: Axel Filler [phone]  Attending Physician: Axel Filler [phone]          B Medical/Surgery History Past Medical History:  Diagnosis Date   Acute kidney injury (AKI) with acute tubular necrosis (ATN) ([place])    Cervical disc disease    Chronic kidney disease    Coronary artery disease    Gout    Hypertension    Nephrolithiasis    Pneumonia due to COVID-19 virus    Presence of stent in LAD coronary artery    Prostate cancer ([place])    S/P placement of cardiac pacemaker    Urinary incontinence    Past Surgical History:  Procedure Laterality Date   ANKLE FRACTURE SURGERY Right    cardiac stents x2     caudal epidural injection     CERVICAL DISCECTOMY     COLONOSCOPY     EXPLORATORY LAPAROTOMY     lumbar facet joint injection Bilateral    medical branch block Bilateral     PROSTATECTOMY     RADIOFREQUENCY ABLATION Bilateral    ROTATOR CUFF REPAIR Right    SACROILIAC JOINT ARTHRODESIS Right    SACROILIAC JOINT ARTHRODESIS Bilateral    sacroilliac joint inj     SPIROMETRY       A IV Location/Drains/Wounds Patient Lines/Drains/Airways Status     Active Line/Drains/Airways     Name Placement date Placement time Site Days   Peripheral IV 10/31/22 20 G Left Antecubital 10/31/22  1420  Antecubital  less than 1            Intake/Output Last 24 hours No intake or output data in the 24 hours ending 10/31/22 1544  Labs/Imaging Results for orders placed or performed during the hospital encounter of 10/31/22 (from the past 48 hour(s))  Basic metabolic panel     Status: Abnormal   Collection Time: 10/31/22  9:57 AM  Result Value Ref Range   Sodium 141 135 - 145 mmol/L   Potassium 3.5 3.5 - 5.1 mmol/L   Chloride 110 98 - 111 mmol/L   CO2 19 (L) 22 - 32 mmol/L   Glucose, Bld 113 (H) 70 - 99 mg/dL    Comment: Glucose reference range applies only to samples taken after fasting for at least 8 hours.   BUN 40 (H) 8 - 23 mg/dL   Creatinine, Ser 3.19 (H) 0.61 - 1.24  mg/dL   Calcium 8.5 (L) 8.9 - 10.3 mg/dL   GFR, Estimated 18 (L) >60 mL/min    Comment: (NOTE) Calculated using the CKD-EPI Creatinine Equation (2021)    Anion gap 12 5 - 15    Comment: Performed at [facility], [place] [address] [place], [place] [address]  CBC     Status: Abnormal   Collection Time: 10/31/22  9:57 AM  Result Value Ref Range   WBC 6.0 4.0 - 10.5 K/uL   RBC 3.88 (L) 4.22 - 5.81 MIL/uL   Hemoglobin 10.1 (L) 13.0 - 17.0 g/dL   HCT 33.1 (L) 39.0 - 52.0 %   MCV 85.3 80.0 - 100.0 fL   MCH 26.0 26.0 - 34.0 pg   MCHC 30.5 30.0 - 36.0 g/dL   RDW 17.0 (H) 11.5 - 15.5 %   Platelets 209 150 - 400 K/uL   nRBC 0.0 0.0 - 0.2 %    Comment: Performed at [facility], [place] [address], [place], [place] [address]  Troponin I (High Sensitivity)     Status: Abnormal   Collection  Time: 10/31/22  9:57 AM  Result Value Ref Range   Troponin I (High Sensitivity) 64 (H) <18 ng/L    Comment: (NOTE) Elevated high sensitivity troponin I (hsTnI) values and significant  changes across serial measurements may suggest ACS but many other  chronic and acute conditions are known to elevate hsTnI results.  Refer to the "Links" section for chest pain algorithms and additional  guidance. Performed at [facility], [place] [address], [place], [place] [address]   Brain natriuretic peptide     Status: Abnormal   Collection Time: 10/31/22  9:57 AM  Result Value Ref Range   B Natriuretic Peptide 1,761.5 (H) 0.0 - 100.0 pg/mL    Comment: Performed at [place] [address], [place], [place] [address]  Troponin I (High Sensitivity)     Status: Abnormal   Collection Time: 10/31/22  2:43 PM  Result Value Ref Range   Troponin I (High Sensitivity) 62 (H) <18 ng/L    Comment: (NOTE) Elevated high sensitivity troponin I (hsTnI) values and significant  changes across serial measurements may suggest ACS but many other  chronic and acute conditions are known to elevate hsTnI results.  Refer to the "Links" section for chest pain algorithms and additional  guidance. Performed at [facility], [place] [address], [place], [place] [address]    DG Chest 2 View  Result Date: 10/31/2022 CLINICAL DATA:  Onset shortness of breath yesterday. EXAM: CHEST - 2 VIEW COMPARISON:  Single-view of the chest 08/31/2022 and 04/05/2022. FINDINGS: The patient has a moderate right pleural effusion with basilar airspace disease. Trace left pleural effusion and mild basilar atelectasis are also seen. Heart size is normal. No pneumothorax. Pacing device in place. No acute or focal bony abnormality. IMPRESSION: Moderate right pleural effusion with basilar airspace disease which could be due to atelectasis or pneumonia. Trace left pleural effusion also noted. Electronically Signed   By: Inge Rise  M.D.   On: 10/31/2022 10:52    Pending Labs Unresulted Labs (From admission, onward)     Start     Ordered   11/01/22 1761  Basic metabolic panel  Tomorrow morning,   R        10/31/22 1354   11/01/22 0500  CBC  Tomorrow morning,   R        10/31/22 1354  Vitals/Pain Today's Vitals   10/31/22 1021 10/31/22 1027 10/31/22 1445 10/31/22 1518  BP:  (!) 152/77  (!) 158/71  Pulse:  63  62  Resp:  18  (!) 28  Temp:   97.6 F (36.4 C)   TempSrc:   Oral   SpO2:  99%  98%  Weight:      Height:      PainSc: 0-No pain       Isolation Precautions No active isolations  Medications Medications  acetaminophen (TYLENOL) tablet 650 mg (has no administration in time range)    Or  acetaminophen (TYLENOL) suppository 650 mg (has no administration in time range)  amiodarone (PACERONE) tablet 200 mg (200 mg Oral Given 10/31/22 1446)  apixaban (ELIQUIS) tablet 2.5 mg (2.5 mg Oral Given 10/31/22 1446)  aspirin EC tablet 81 mg (81 mg Oral Given 10/31/22 1445)  atorvastatin (LIPITOR) tablet 20 mg (20 mg Oral Given 10/31/22 1445)  pantoprazole (PROTONIX) EC tablet 20 mg (20 mg Oral Given 10/31/22 1446)  cholecalciferol (VITAMIN D3) 25 MCG (1000 UNIT) tablet 1,000 Units (1,000 Units Oral Given 10/31/22 1445)  furosemide (LASIX) injection 80 mg (80 mg Intravenous Given 10/31/22 1420)    Mobility walks with device     Focused Assessments Cardiac Assessment Handoff:  Cardiac Rhythm: Ventricular paced No results found for: "CKTOTAL", "CKMB", "CKMBINDEX", "TROPONINI" No results found for: "DDIMER" Does the Patient currently have chest pain? No    R Recommendations: See Admitting Provider Note  Report given to:   Additional Notes: n/a

## 2022-10-31 NOTE — ED Triage Notes (Signed)
Pt. Stated, I can't hardly breath and sometimes Im not breathing at all. This started yesterday and I have CHF and swelling in my legs and feet. Marland Kitchen

## 2022-10-31 NOTE — Hospital Course (Addendum)
Principal Problem:   Acute on chronic combined systolic and diastolic CHF (congestive heart failure) (HCC) Active Problems:   Essential hypertension, benign   Pacemaker Saint Jude device implant date 08/23/2017   Acute kidney injury superimposed on chronic kidney disease ([place])   Coronary artery disease   Arrhythmia  Resolved Problems:   Dyspnea on exertion   Acute exacerbation of CHF (congestive heart failure) (HCC)   Acute exacerbation of CHF (congestive heart failure) ([place])  Consults: - Cardiology  Procedures:***  Follow-up items: - home health PT - V tach

## 2022-11-01 ENCOUNTER — Observation Stay (HOSPITAL_COMMUNITY): Payer: Medicare HMO

## 2022-11-01 DIAGNOSIS — I509 Heart failure, unspecified: Secondary | ICD-10-CM

## 2022-11-01 DIAGNOSIS — E876 Hypokalemia: Secondary | ICD-10-CM | POA: Diagnosis present

## 2022-11-01 DIAGNOSIS — Z515 Encounter for palliative care: Secondary | ICD-10-CM | POA: Diagnosis not present

## 2022-11-01 DIAGNOSIS — Z79899 Other long term (current) drug therapy: Secondary | ICD-10-CM | POA: Diagnosis not present

## 2022-11-01 DIAGNOSIS — Z7982 Long term (current) use of aspirin: Secondary | ICD-10-CM | POA: Diagnosis not present

## 2022-11-01 DIAGNOSIS — I130 Hypertensive heart and chronic kidney disease with heart failure and stage 1 through stage 4 chronic kidney disease, or unspecified chronic kidney disease: Secondary | ICD-10-CM | POA: Diagnosis present

## 2022-11-01 DIAGNOSIS — I5043 Acute on chronic combined systolic (congestive) and diastolic (congestive) heart failure: Secondary | ICD-10-CM

## 2022-11-01 DIAGNOSIS — Z8249 Family history of ischemic heart disease and other diseases of the circulatory system: Secondary | ICD-10-CM | POA: Diagnosis not present

## 2022-11-01 DIAGNOSIS — Z7901 Long term (current) use of anticoagulants: Secondary | ICD-10-CM | POA: Diagnosis not present

## 2022-11-01 DIAGNOSIS — Z955 Presence of coronary angioplasty implant and graft: Secondary | ICD-10-CM | POA: Diagnosis not present

## 2022-11-01 DIAGNOSIS — Z8616 Personal history of COVID-19: Secondary | ICD-10-CM | POA: Diagnosis not present

## 2022-11-01 DIAGNOSIS — I2510 Atherosclerotic heart disease of native coronary artery without angina pectoris: Secondary | ICD-10-CM | POA: Diagnosis present

## 2022-11-01 DIAGNOSIS — N179 Acute kidney failure, unspecified: Secondary | ICD-10-CM | POA: Diagnosis present

## 2022-11-01 DIAGNOSIS — I5031 Acute diastolic (congestive) heart failure: Secondary | ICD-10-CM | POA: Diagnosis not present

## 2022-11-01 DIAGNOSIS — Z801 Family history of malignant neoplasm of trachea, bronchus and lung: Secondary | ICD-10-CM | POA: Diagnosis not present

## 2022-11-01 DIAGNOSIS — Z833 Family history of diabetes mellitus: Secondary | ICD-10-CM | POA: Diagnosis not present

## 2022-11-01 DIAGNOSIS — I480 Paroxysmal atrial fibrillation: Secondary | ICD-10-CM | POA: Diagnosis present

## 2022-11-01 DIAGNOSIS — N184 Chronic kidney disease, stage 4 (severe): Secondary | ICD-10-CM | POA: Diagnosis present

## 2022-11-01 DIAGNOSIS — I472 Ventricular tachycardia, unspecified: Secondary | ICD-10-CM | POA: Diagnosis present

## 2022-11-01 DIAGNOSIS — Z8546 Personal history of malignant neoplasm of prostate: Secondary | ICD-10-CM | POA: Diagnosis not present

## 2022-11-01 DIAGNOSIS — Z66 Do not resuscitate: Secondary | ICD-10-CM | POA: Diagnosis present

## 2022-11-01 DIAGNOSIS — Z950 Presence of cardiac pacemaker: Secondary | ICD-10-CM | POA: Diagnosis not present

## 2022-11-01 DIAGNOSIS — E1122 Type 2 diabetes mellitus with diabetic chronic kidney disease: Secondary | ICD-10-CM | POA: Diagnosis present

## 2022-11-01 LAB — RESPIRATORY PANEL BY PCR

## 2022-11-01 LAB — CBC
HCT: 28.2 % — ABNORMAL LOW (ref 39.0–52.0)
Hemoglobin: 9.1 g/dL — ABNORMAL LOW (ref 13.0–17.0)
MCH: 26.5 pg (ref 26.0–34.0)
MCHC: 32.3 g/dL (ref 30.0–36.0)
MCV: 82 fL (ref 80.0–100.0)
Platelets: 202 10*3/uL (ref 150–400)
RBC: 3.44 MIL/uL — ABNORMAL LOW (ref 4.22–5.81)
RDW: 16.8 % — ABNORMAL HIGH (ref 11.5–15.5)
WBC: 6.9 10*3/uL (ref 4.0–10.5)
nRBC: 0 % (ref 0.0–0.2)

## 2022-11-01 LAB — ECHOCARDIOGRAM COMPLETE
Area-P 1/2: 3.65 cm2
Calc EF: 27.6 %
MV M vel: 4.77 m/s
MV Peak grad: 90.8 mmHg
Radius: 0.4 cm
S' Lateral: 3.9 cm
Single Plane A2C EF: 31.9 %
Single Plane A4C EF: 28.4 %

## 2022-11-01 LAB — BASIC METABOLIC PANEL
Anion gap: 9 (ref 5–15)
BUN: 39 mg/dL — ABNORMAL HIGH (ref 8–23)
CO2: 21 mmol/L — ABNORMAL LOW (ref 22–32)
Calcium: 8.2 mg/dL — ABNORMAL LOW (ref 8.9–10.3)
Chloride: 108 mmol/L (ref 98–111)
Creatinine, Ser: 3.17 mg/dL — ABNORMAL HIGH (ref 0.61–1.24)
GFR, Estimated: 18 mL/min — ABNORMAL LOW (ref >60)
Glucose, Bld: 122 mg/dL — ABNORMAL HIGH (ref 70–99)
Potassium: 3.1 mmol/L — ABNORMAL LOW (ref 3.5–5.1)
Sodium: 138 mmol/L (ref 135–145)

## 2022-11-01 LAB — MAGNESIUM: Magnesium: 2 mg/dL (ref 1.7–2.4)

## 2022-11-01 MED ORDER — FUROSEMIDE 10 MG/ML IJ SOLN
100.0000 mg | Freq: Once | INTRAVENOUS | Status: AC
  Administered 2022-11-01: 100 mg via INTRAVENOUS
  Filled 2022-11-01: qty 10

## 2022-11-01 MED ORDER — PERFLUTREN LIPID MICROSPHERE
1.0000 mL | INTRAVENOUS | Status: AC | PRN
  Administered 2022-11-01: 4 mL via INTRAVENOUS

## 2022-11-01 MED ORDER — POTASSIUM CHLORIDE CRYS ER 20 MEQ PO TBCR
40.0000 meq | EXTENDED_RELEASE_TABLET | Freq: Once | ORAL | Status: AC
  Administered 2022-11-01: 40 meq via ORAL
  Filled 2022-11-01: qty 2

## 2022-11-01 MED ORDER — CARVEDILOL 12.5 MG PO TABS
12.5000 mg | ORAL_TABLET | Freq: Two times a day (BID) | ORAL | Status: DC
  Administered 2022-11-01 – 2022-11-02 (×3): 12.5 mg via ORAL
  Filled 2022-11-01 (×3): qty 1

## 2022-11-01 NOTE — Progress Notes (Signed)
Interval history No new concerns since yesterday. Tolerated laying flat on his back for most of the night without much dyspnea.  Physical exam Blood pressure (!) 128/58, pulse 79, temperature 97.7 F (36.5 C), temperature source Oral, resp. rate (!) 21, height '5\' 8"'$  (1.727 m), weight 82.1 kg, SpO2 97 %.  Comfortable appearing, sitting in recliner Heart rate and rhythm are regular, JVP elevated 3 cm above sternal angle, hepatojugular sign present, trace pitting edema bilateral lower extremities Breathing is regular and unlabored Abdomen soft and nontender Skin is warm and dry Alert and oriented, no gross focal neurologic deficits Pleasant, mood and affect are concordant  Weight change: -0.5 kg   Intake/Output Summary (Last 24 hours) at 11/01/2022 1448 Last data filed at 11/01/2022 1150 Gross per 24 hour  Intake 240 ml  Output 1400 ml  Net -1160 ml   Net IO Since Admission: -1,160 mL [11/01/22 1448]  Labs, images, and other studies Potassium 3.1 Magnesium 2.0 BUN 39 Creatinine 3.17 Hematocrit 28.2  Assessment and plan Hospital day 0  Steven Rosario is a 89 y.o. with CKD G4, atrial fibrillation, AV pacemaker who is admitted for acute decompensated heart failure.  Principal Problem:   Acute on chronic combined systolic and diastolic CHF (congestive heart failure) (HCC) Active Problems:   Essential hypertension, benign   Pacemaker Saint Jude device implant date 08/23/2017   Acute kidney injury superimposed on chronic kidney disease ([place])   Coronary artery disease  Acute decompensated heart failure Remains hemodynamically stable.  Lingering signs of volume overload on exam.  Jugular venous exam suggestive of right atrial pressure of 8 cm H2O.  Perfusing well.  No need for supplemental oxygen.  Remote pacemaker interrogation showed no evidence of ventricular tachycardia.  Continue diuresis.  Restart beta-blocker.  Echo pending.  Check ambulatory pulse  oximetry. - Follow-up echocardiogram - Lasix 100 mg IV - Carvedilol 12.5 mg twice daily - Hold home Imdur and hydralazine - Continue telemetry  AKI on CKD G4 Stable today.  GFR approximately 18. - A.m. BMP  Atrial fibrillation - Amiodarone 200 mg p.o. daily - Apixaban 2.5 mg twice daily  Coronary artery disease - Aspirin 81 mg daily - Atorvastatin 20 mg daily  Diet: Regular IVF: None VTE: Eliquis Code: DNR PT/OT recommendations: Pending  Discharge plan: Hopeful for discharge home after diuresis, pending ambulatory pulse oximetry and echocardiogram  [name] 11/01/2022, 2:48 PM  Pager: [phone] After 5pm on weekdays and 1pm on weekends: [phone]

## 2022-11-01 NOTE — Progress Notes (Signed)
Mobility Specialist Progress Note:   11/01/22 1553  Mobility  Activity Ambulated with assistance in hallway  Level of Assistance Contact guard assist, steadying assist  Assistive Device Front wheel walker  Distance Ambulated (ft) 500 ft  Activity Response Tolerated well  $Mobility charge 1 Mobility   Pt in bed willing to participate in mobility. No complaints of pain. Left In bed with call bell in reach and all needs met.   Gareth Eagle Halimah Bewick Mobility Specialist Please contact via [employer] or  Rehab Office at [phone]

## 2022-11-01 NOTE — Progress Notes (Signed)
Echocardiogram 2D Echocardiogram has been performed.  Steven Rosario 11/01/2022, 3:08 PM

## 2022-11-01 NOTE — Progress Notes (Signed)
Nurse requested Mobility Specialist to perform oxygen saturation test with pt which includes removing pt from oxygen both at rest and while ambulating.  Below are the results from that testing.     Patient Saturations on Room Air at Rest = spO2 99%  Patient Saturations on Room Air while Ambulating = sp02 93% .    Reported results to nurse.

## 2022-11-01 NOTE — Evaluation (Addendum)
Occupational Therapy Evaluation Patient Details Name: Steven Rosario MRN: [id number] DOB: 1929/08/26 Today's Date: 11/01/2022   History of Present Illness Pt is a 89 y.o. male who presented 10/31/22 with increased dyspnea on exertion and orthopnea. Pt admitted with acute on chronic HFrEF. PMHx includes: HTN, gout, CKD stage IV, paroxysmal afib, and CAD.   Clinical Impression   PTA patient independent and driving. Admitted for above and presents with problem list below.  He requires min guard for functional mobility and transfers using RW, up to min guard assist for ADLS.  Educated on safety and energy conservation.  Believe he will benefit from further OT services acutely and after dc at [facility] level to optimize independence, safety and tolerance for ADLs, mobility.  Will follow.      Recommendations for follow up therapy are one component of a multi-disciplinary discharge planning process, led by the attending physician.  Recommendations may be updated based on patient status, additional functional criteria and insurance authorization.   Follow Up Recommendations  Home health OT     Assistance Recommended at Discharge PRN  Patient can return home with the following A little help with walking and/or transfers;A little help with bathing/dressing/bathroom;Assistance with cooking/housework;Assist for transportation;Help with stairs or ramp for entrance    Functional Status Assessment  Patient has had a recent decline in their functional status and demonstrates the ability to make significant improvements in function in a reasonable and predictable amount of time.  Equipment Recommendations  None recommended by OT    Recommendations for Other Services       Precautions / Restrictions Precautions Precautions: Fall;Other (comment) Precaution Comments: urinary incontinence; watch SpO2 Restrictions Weight Bearing Restrictions: No      Mobility Bed Mobility               General bed  mobility comments: OOB in recliner upon entry    Transfers Overall transfer level: Needs assistance Equipment used: Rolling walker (2 wheels) Transfers: Sit to/from Stand Sit to Stand: Min guard                  Balance Overall balance assessment: Needs assistance Sitting-balance support: No upper extremity supported, Feet supported Sitting balance-Leahy Scale: Good     Standing balance support: Bilateral upper extremity supported, During functional activity Standing balance-Leahy Scale: Poor Standing balance comment: Reliant on RW, able to engage in grooming without UE support but min guard for safety                           ADL either performed or assessed with clinical judgement   ADL Overall ADL's : Needs assistance/impaired     Grooming: Min guard;Standing           Upper Body Dressing : Set up;Sitting   Lower Body Dressing: Min guard;Sit to/from stand   Toilet Transfer: Min guard;Ambulation;Rolling walker (2 wheels)           Functional mobility during ADLs: Min guard;Rolling walker (2 wheels)       Vision   Vision Assessment?: No apparent visual deficits     Perception     Praxis      Pertinent Vitals/Pain Pain Assessment Pain Assessment: Faces Faces Pain Scale: No hurt Pain Intervention(s): Monitored during session     Hand Dominance Right   Extremity/Trunk Assessment Upper Extremity Assessment Upper Extremity Assessment: Generalized weakness (chronic L rotator cuff injury)   Lower Extremity Assessment Lower Extremity Assessment: Defer to  PT evaluation   Cervical / Trunk Assessment Cervical / Trunk Assessment: Normal   Communication Communication Communication: HOH   Cognition Arousal/Alertness: Awake/alert Behavior During Therapy: WFL for tasks assessed/performed Overall Cognitive Status: Within Functional Limits for tasks assessed                                       General Comments  VSS  on RA    Exercises     Shoulder Instructions      Home Living Family/patient expects to be discharged to:: Private residence Living Arrangements: Alone Available Help at Discharge: Family;Friend(s);Available PRN/intermittently Type of Home: House Home Access: Stairs to enter [employer] of Steps: 5 Entrance Stairs-Rails: Can reach both Home Layout: One level     Bathroom Shower/Tub: Occupational psychologist: Standard     Home Equipment: Rollator (4 wheels);Grab bars - toilet;Grab bars - tub/shower;Shower seat - built Medical sales representative (2 wheels)          Prior Functioning/Environment Prior Level of Function : Independent/Modified Independent;Driving             Mobility Comments: Uses RW vs rollator. Denies any recent falls. ADLs Comments: independent, driving; Still mows own lawn        OT Problem List: Decreased strength;Decreased activity tolerance;Impaired balance (sitting and/or standing);Decreased knowledge of use of DME or AE;Decreased knowledge of precautions      OT Treatment/Interventions: Self-care/ADL training;Therapeutic exercise;DME and/or AE instruction;Therapeutic activities;Balance training;Patient/family education    OT Goals(Current goals can be found in the care plan section) Acute Rehab OT Goals Patient Stated Goal: home OT Goal Formulation: With patient Time For Goal Achievement: 11/15/22 Potential to Achieve Goals: Good  OT Frequency: Min 2X/week    Co-evaluation              AM-PAC OT "6 Clicks" Daily Activity     Outcome Measure Help from another person eating meals?: None Help from another person taking care of personal grooming?: A Little Help from another person toileting, which includes using toliet, bedpan, or urinal?: A Little Help from another person bathing (including washing, rinsing, drying)?: A Little Help from another person to put on and taking off regular upper body clothing?: A Little Help  from another person to put on and taking off regular lower body clothing?: A Little 6 Click Score: 19   End of Session Equipment Utilized During Treatment: Gait belt;Rolling walker (2 wheels) Nurse Communication: Need for lift equipment;Mobility status;Patient requests pain meds  Activity Tolerance: Patient tolerated treatment well Patient left: in chair;with call bell/phone within reach;with chair alarm set  OT Visit Diagnosis: Other abnormalities of gait and mobility (R26.89);Muscle weakness (generalized) (M62.81)                Time: 1610-9604 OT Time Calculation (min): 16 min Charges:  OT General Charges $OT Visit: 1 Visit OT Evaluation $OT Eval Moderate Complexity: 1 Mod  Jolaine Artist, OT Acute Rehabilitation Services Office [phone]   Delight Stare 11/01/2022, 1:46 PM

## 2022-11-01 NOTE — Evaluation (Signed)
Physical Therapy Evaluation Patient Details Name: Steven Rosario MRN: [id number] DOB: 08-12-1929 Today's Date: 11/01/2022  History of Present Illness  Pt is a 89 y.o. male who presented 10/31/22 with increased dyspnea on exertion and orthopnea. Pt admitted with acute on chronic HFrEF. PMHx includes: HTN, gout, CKD stage IV, paroxysmal afib, and CAD.   Clinical Impression  Pt presents with condition above and deficits mentioned below, see PT Problem List. PTA, he was mod I using a RW vs rollator and living alone in a 1-level house with 5 STE. Currently, pt is demonstrating deficits in gross overall strength, balance, and activity tolerance. Some of these deficits have likely been present for a while prior to this exacerbation. Recommending pt d/c home with HHPT follow-up to maximize his safety and independence with functional mobility. Will continue to follow acutely.       Recommendations for follow up therapy are one component of a multi-disciplinary discharge planning process, led by the attending physician.  Recommendations may be updated based on patient status, additional functional criteria and insurance authorization.  Follow Up Recommendations Home health PT      Assistance Recommended at Discharge PRN  Patient can return home with the following  Assistance with cooking/housework;Help with stairs or ramp for entrance    Equipment Recommendations None recommended by PT  Recommendations for Other Services       Functional Status Assessment Patient has had a recent decline in their functional status and demonstrates the ability to make significant improvements in function in a reasonable and predictable amount of time.     Precautions / Restrictions Precautions Precautions: Fall;Other (comment) Precaution Comments: urinary incontinence; watch SpO2 Restrictions Weight Bearing Restrictions: No      Mobility  Bed Mobility Overal bed mobility: Needs Assistance Bed Mobility:  Rolling, Sidelying to Sit Rolling: Supervision Sidelying to sit: Min assist, HOB elevated       General bed mobility comments: Pt rolled to R in bed with supervision, minA to ascend trunk to sit up through after pt was unsuccessful with x2 attempts    Transfers Overall transfer level: Needs assistance Equipment used: Rolling walker (2 wheels) Transfers: Sit to/from Stand Sit to Stand: Min guard           General transfer comment: Min guard assist for safety coming to stand from EOB, no LOB    Ambulation/Gait Ambulation/Gait assistance: Min guard Gait Distance (Feet): 160 Feet Assistive device: Rolling walker (2 wheels) Gait Pattern/deviations: Step-through pattern, Decreased stride length, Trunk flexed Gait velocity: reduced Gait velocity interpretation: 1.31 - 2.62 ft/sec, indicative of limited community ambulator   General Gait Details: Pt with slowed gait and slightly flexed posture. Mild instability noted, but no overt LOB, min guard assist for safety  Stairs            Wheelchair Mobility    Modified Rankin (Stroke Patients Only)       Balance Overall balance assessment: Needs assistance Sitting-balance support: No upper extremity supported, Feet supported Sitting balance-Leahy Scale: Good     Standing balance support: Bilateral upper extremity supported, During functional activity, Reliant on assistive device for balance Standing balance-Leahy Scale: Poor Standing balance comment: Reliant on RW                             Pertinent Vitals/Pain Pain Assessment Pain Assessment: Faces Faces Pain Scale: No hurt Pain Intervention(s): Monitored during session    Home Living Family/patient expects to  be discharged to:: Private residence Living Arrangements: Alone Available Help at Discharge: Family;Friend(s);Available PRN/intermittently (may be able to get 24/7 if really needed per pt) Type of Home: House Home Access: Stairs to  enter Entrance Stairs-Rails: Can reach both Entrance Stairs-Number of Steps: 5   Home Layout: One level Home Equipment: Rollator (4 wheels);Grab bars - toilet;Grab bars - tub/shower;Shower seat - built Medical sales representative (2 wheels)      Prior Function Prior Level of Function : Independent/Modified Independent;Driving             Mobility Comments: Uses RW vs rollator. Denies any recent falls. ADLs Comments: Still mows own lawn     Hand Dominance        Extremity/Trunk Assessment   Upper Extremity Assessment Upper Extremity Assessment: Defer to OT evaluation    Lower Extremity Assessment Lower Extremity Assessment: Generalized weakness (mild edema noted)    Cervical / Trunk Assessment Cervical / Trunk Assessment: Normal  Communication   Communication: HOH  Cognition Arousal/Alertness: Awake/alert Behavior During Therapy: WFL for tasks assessed/performed Overall Cognitive Status: Within Functional Limits for tasks assessed                                          General Comments General comments (skin integrity, edema, etc.): Pleth unreliable reading in 80s% intermittently when ambulating but intermittently reliably reading in 90s% when ambulating on RW, reliably as low as 87% once sitting up EOB on RA but 97% on RA sitting in recliner end of session    Exercises     Assessment/Plan    PT Assessment Patient needs continued PT services  PT Problem List Decreased strength;Decreased activity tolerance;Decreased balance;Decreased mobility;Cardiopulmonary status limiting activity       PT Treatment Interventions DME instruction;Gait training;Stair training;Functional mobility training;Therapeutic activities;Therapeutic exercise;Balance training;Neuromuscular re-education;Patient/family education    PT Goals (Current goals can be found in the Care Plan section)  Acute Rehab PT Goals Patient Stated Goal: to continue to improve breathing PT Goal  Formulation: With patient Time For Goal Achievement: 11/15/22 Potential to Achieve Goals: Good    Frequency Min 3X/week     Co-evaluation               AM-PAC PT "6 Clicks" Mobility  Outcome Measure Help needed turning from your back to your side while in a flat bed without using bedrails?: A Little Help needed moving from lying on your back to sitting on the side of a flat bed without using bedrails?: A Little Help needed moving to and from a bed to a chair (including a wheelchair)?: A Little Help needed standing up from a chair using your arms (e.g., wheelchair or bedside chair)?: A Little Help needed to walk in hospital room?: A Little Help needed climbing 3-5 steps with a railing? : A Little 6 Click Score: 18    End of Session Equipment Utilized During Treatment: Gait belt Activity Tolerance: Patient tolerated treatment well Patient left: in chair;with call bell/phone within reach;with chair alarm set   PT Visit Diagnosis: Unsteadiness on feet (R26.81);Other abnormalities of gait and mobility (R26.89);Muscle weakness (generalized) (M62.81)    Time: 5035-4656 PT Time Calculation (min) (ACUTE ONLY): 24 min   Charges:   PT Evaluation $PT Eval Moderate Complexity: 1 Mod PT Treatments $Therapeutic Activity: 8-22 mins        Moishe Spice, PT, DPT Acute Rehabilitation Services  Office:  [place] 11/01/2022, 8:53 AM

## 2022-11-02 DIAGNOSIS — Z515 Encounter for palliative care: Secondary | ICD-10-CM

## 2022-11-02 DIAGNOSIS — I5043 Acute on chronic combined systolic (congestive) and diastolic (congestive) heart failure: Secondary | ICD-10-CM | POA: Diagnosis not present

## 2022-11-02 DIAGNOSIS — Z66 Do not resuscitate: Secondary | ICD-10-CM | POA: Diagnosis not present

## 2022-11-02 DIAGNOSIS — I499 Cardiac arrhythmia, unspecified: Secondary | ICD-10-CM | POA: Insufficient documentation

## 2022-11-02 LAB — CBC
HCT: 32.3 % — ABNORMAL LOW (ref 39.0–52.0)
Hemoglobin: 10.3 g/dL — ABNORMAL LOW (ref 13.0–17.0)
MCH: 26.4 pg (ref 26.0–34.0)
MCHC: 31.9 g/dL (ref 30.0–36.0)
MCV: 82.8 fL (ref 80.0–100.0)
Platelets: 218 10*3/uL (ref 150–400)
RBC: 3.9 MIL/uL — ABNORMAL LOW (ref 4.22–5.81)
RDW: 16.9 % — ABNORMAL HIGH (ref 11.5–15.5)
WBC: 8.4 10*3/uL (ref 4.0–10.5)
nRBC: 0 % (ref 0.0–0.2)

## 2022-11-02 LAB — BASIC METABOLIC PANEL
Anion gap: 7 (ref 5–15)
BUN: 42 mg/dL — ABNORMAL HIGH (ref 8–23)
CO2: 24 mmol/L (ref 22–32)
Calcium: 8.3 mg/dL — ABNORMAL LOW (ref 8.9–10.3)
Chloride: 109 mmol/L (ref 98–111)
Creatinine, Ser: 3.31 mg/dL — ABNORMAL HIGH (ref 0.61–1.24)
GFR, Estimated: 17 mL/min — ABNORMAL LOW (ref >60)
Glucose, Bld: 112 mg/dL — ABNORMAL HIGH (ref 70–99)
Potassium: 4 mmol/L (ref 3.5–5.1)
Sodium: 140 mmol/L (ref 135–145)

## 2022-11-02 MED ORDER — FUROSEMIDE 40 MG PO TABS
80.0000 mg | ORAL_TABLET | Freq: Every day | ORAL | Status: DC
  Administered 2022-11-02: 80 mg via ORAL
  Filled 2022-11-02: qty 2

## 2022-11-02 NOTE — Progress Notes (Signed)
Discharge instructions reviewed with pt.  Copy of instructions given to pt. No new scripts. Pt's son on his way to pick up pt at main entrance.  Pt d/c'd via wheelchair with belongings, with son picking him up.          Escorted by staff.

## 2022-11-02 NOTE — Discharge Instructions (Addendum)
Mr. Michaela Shankel  You were evaluated and treated in the hospital for exacerbation of heart failure.  The results of your evaluation showed no obvious underlying cause for this heart failure exacerbation.  It is possible that this is due to worsening of your underlying condition.  You were treated with Lasix through your IV to good effect.  While you are hospitalized we also noticed an abnormal heart rhythm.  Our cardiologists were consulted to evaluate you for this.  They do not think this abnormal rhythm requires a prolonged hospitalization.  We are discharging you now that you are doing better. To help assist you on your road to recovery, I have written the following recommendations:   Continue taking your medicine as prescribed.  I recommend following up with your cardiac electrophysiologist, Dr. Allegra Lai, as soon as possible after leaving the hospital.  Make follow-up appointments with your nephrologist and primary care physician as well.  Our physical and occupational therapists recommended continued therapy at home given your weakness from heart failure.  I have placed a referral for these services.  During your hospital stay, we updated your CODE STATUS, which means the actions healthcare professionals take if your heart stops beating or you stop breathing. Based on your wishes, you elected to keep your DNR status, which means healthcare professionals will NOT do CPR or place breathing tubes, thus prolonging your life by artificial means. However, you will still receive all medical care indicated for reversible conditions.   It was a privilege to be a part of your hospital care team, and I hope you feel better as a result of your stay.  All the best, Nani Gasser, MD

## 2022-11-02 NOTE — Progress Notes (Signed)
Physical Therapy Treatment Patient Details Name: Steven Rosario MRN: [id number] DOB: 19-Jul-1929 Today's Date: 11/02/2022   History of Present Illness Pt is a 89 y.o. male who presented 10/31/22 with increased dyspnea on exertion and orthopnea. Pt admitted with acute on chronic HFrEF. PMHx includes: HTN, gout, CKD stage IV, paroxysmal afib, and CAD.    PT Comments    Pt greeted supine in bed, sleeping but easily woken and eager for OOB mobility with continued progress towards acute goals. Pt grossly min guard for all mobility with pt demonstrating increased tolerance for gait with RW and ability to ascend/descend full flight of steps in stairwell without LOB. Pt continues to be limited by general weakness, impaired balance/postural reactions and decreased actiivty tolerance and current plan remains appropriate to address deficits and maximize functional independence and safety. Pt continues to benefit from skilled PT services to progress toward functional mobility goals.    Recommendations for follow up therapy are one component of a multi-disciplinary discharge planning process, led by the attending physician.  Recommendations may be updated based on patient status, additional functional criteria and insurance authorization.  Follow Up Recommendations  Home health PT     Assistance Recommended at Discharge PRN  Patient can return home with the following Assistance with cooking/housework;Help with stairs or ramp for entrance   Equipment Recommendations  None recommended by PT    Recommendations for Other Services       Precautions / Restrictions Precautions Precautions: Fall;Other (comment) Precaution Comments: urinary incontinence; watch SpO2 Restrictions Weight Bearing Restrictions: No     Mobility  Bed Mobility Overal bed mobility: Needs Assistance Bed Mobility: Supine to Sit     Supine to sit: Supervision     General bed mobility comments: no physical assist needed     Transfers Overall transfer level: Needs assistance Equipment used: Rolling walker (2 wheels) Transfers: Sit to/from Stand Sit to Stand: Min guard           General transfer comment: Min guard assist for safety coming to stand from EOB, no LOB    Ambulation/Gait Ambulation/Gait assistance: Min guard Gait Distance (Feet): 550 Feet Assistive device: Rolling walker (2 wheels) Gait Pattern/deviations: Step-through pattern, Decreased stride length, Trunk flexed Gait velocity: reduced     General Gait Details: Pt with slowed gait and slightly flexed posture. Mild instability noted, but no overt LOB, min guard assist for safety   Stairs Stairs: Yes Stairs assistance: Min guard Stair Management: Two rails, Step to pattern, Forwards Number of Stairs: 12 (flight) General stair comments: up/down steps in stairwell without fault   Wheelchair Mobility    Modified Rankin (Stroke Patients Only)       Balance Overall balance assessment: Needs assistance Sitting-balance support: No upper extremity supported, Feet supported Sitting balance-Leahy Scale: Good     Standing balance support: Bilateral upper extremity supported, During functional activity Standing balance-Leahy Scale: Poor Standing balance comment: Reliant on RW, able to engage in grooming without UE support but min guard for safety                            Cognition Arousal/Alertness: Awake/alert Behavior During Therapy: WFL for tasks assessed/performed Overall Cognitive Status: Within Functional Limits for tasks assessed  Exercises      General Comments General comments (skin integrity, edema, etc.): VSS on RA      Pertinent Vitals/Pain Pain Assessment Pain Assessment: No/denies pain Pain Intervention(s): Monitored during session    Home Living                          Prior Function            PT Goals (current  goals can now be found in the care plan section) Acute Rehab PT Goals PT Goal Formulation: With patient Time For Goal Achievement: 11/15/22 Progress towards PT goals: Progressing toward goals    Frequency    Min 3X/week      PT Plan      Co-evaluation              AM-PAC PT "6 Clicks" Mobility   Outcome Measure  Help needed turning from your back to your side while in a flat bed without using bedrails?: A Little Help needed moving from lying on your back to sitting on the side of a flat bed without using bedrails?: A Little Help needed moving to and from a bed to a chair (including a wheelchair)?: A Little Help needed standing up from a chair using your arms (e.g., wheelchair or bedside chair)?: A Little Help needed to walk in hospital room?: A Little Help needed climbing 3-5 steps with a railing? : A Little 6 Click Score: 18    End of Session Equipment Utilized During Treatment: Gait belt Activity Tolerance: Patient tolerated treatment well Patient left: in chair;with call bell/phone within reach Nurse Communication: Mobility status PT Visit Diagnosis: Unsteadiness on feet (R26.81);Other abnormalities of gait and mobility (R26.89);Muscle weakness (generalized) (M62.81)     Time: 1000-1021 PT Time Calculation (min) (ACUTE ONLY): 21 min  Charges:  $Gait Training: 8-22 mins $Therapeutic Activity: 8-22 mins                     Steven Rosario R. PTA Acute Rehabilitation Services Office: [place] 11/02/2022, 11:09 AM

## 2022-11-02 NOTE — Consult Note (Signed)
Cardiology Consultation   Patient ID: Steven Rosario MRN: [id number]; DOB: [birth year]  Admit date: 10/31/2022 Date of Consult: 11/02/2022  PCP:  Angelina Sheriff, MD   [place] Providers Cardiologist:  None  Electrophysiologist:  Will Meredith Leeds, MD    Patient Profile:   Steven Rosario is a 89 y.o. 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 Jude PPM in 08/2017, hypertension, CKD stage IV, and prostate cancer who is being seen 11/02/2022 for the evaluation of wide complex tachycardia at the request of Dr. Evette Doffing.  History of Present Illness:   Steven Rosario is a 89 year old male with the above history who is followed by Dr. Curt Bears. Patient was referred to Dr. Agustin Cree in 11/2019 to get established with Cardiology after moving from [place] to [facility].  He has a history of CAD with prior stenting x2 in 2012 as well as atrial fibrillation and complete heart block/ sick sinus syndrome s/p St. Jude dual-chamber PPM in 08/2017. He has been primarily followed by EP  (Dr. Curt Bears) since then. He was admitted in 03/2022 for acute on chronic CHF after presenting with dyspnea. Echo showed LVEF of 35-40% with global hypokinesis, mild LVH, and grade 2 diastolic dysfunction, normal RV, and mild MR. He was diuresed with IV Lasix and then transitioned to PO Lasix. GDMT was limited by renal function; however, he was started on Hydralazine and Imdur. Of note, Cardiology was not consulted during this admission.   He was last seen by Barrington Ellison, PA-C in 08/2022 at which time he was doing well.   Presented to the ED on 10/31/2018 for for further evaluation of shortness of breath.  On arrival to the ED, patient was mildly hypertensive but vital stable.  High-sensitivity troponin minimally elevated and flat at 64 >> 62. BNP elevated at  1,761.  X-ray showed a moderate right pleural effusion with similar disease which could be due to atelectasis or pneumonia as well as a trace left pleural effusion. WBC 6.0, Hgb 10.1, Plts 209. Na 141, K 3.5, Glucose 113, BUN 40, Cr 3.19.  He was admitted to the Internal Medicine Teaching Service and started on IV Lasix with improvement in symptoms.  Patient was noted to have a 6-minute episode of a wide-complex tachycardia on telemetry today.  Therefore, Cardiology consulted for further evaluation.  At the time of this evaluation, patient is resting comfortably in no acute distress. He has some chronic dyspnea on exertion at baseline when walking up stairs/incline or bending over/ lifting things over his head. However, he does not typically have any shortness of breath at rest. However, he reports progressive shortness of breath over the last few days prior to admission. He was noticeably short of breath while at rest and son states he was audibly wheezing. He denies any new or worsening orthopnea (he sleep on 2-3 pillows but this is not new). He denies any PND. He has chronic lower extremity edema and does state this was a little worse prior to admission but no where near as bad as it was in 03/2022. He reports occasional substernal chest tightness that last for about 30 seconds at a time and then resolves on its own. This occurs randomly and is not necessarily correlated with exertion. He states this does not occur often. He denies any palpitations, lightheadedness, dizziness, or  syncope. He has had a productive cough for about 1 month but no other fevers or recent illness. No GI symptoms or abnormal bleeding in urine or stools.  Reviewed telemetry. He is AV paced. He did have a couple of episodes this morning of tachycardia with persistent ventricular pacing. First episode last about 7 minutes and the second episode lasted about 3 minutes. Reviewed telemetry with Tyson Dense, EP PA-C, who confirmed that  this looks like an atrial arrhythmia rate about 110-120 bpm but no VT. Of note, primary team did reach out to Harrington on admission who stated who reviewed device and reported no VT as of 10/30/2022 and 99% ventricular pacing.   Past Medical History:  Diagnosis Date   Acute kidney injury (AKI) with acute tubular necrosis (ATN) (HCC)    Cervical disc disease    Chronic kidney disease    Coronary artery disease    Gout    Hypertension    Nephrolithiasis    Pneumonia due to COVID-19 virus    Presence of stent in LAD coronary artery    Prostate cancer (HCC)    S/P placement of cardiac pacemaker    Urinary incontinence     Past Surgical History:  Procedure Laterality Date   ANKLE FRACTURE SURGERY Right    cardiac stents x2     caudal epidural injection     CERVICAL DISCECTOMY     COLONOSCOPY     EXPLORATORY LAPAROTOMY     lumbar facet joint injection Bilateral    medical branch block Bilateral    PROSTATECTOMY     RADIOFREQUENCY ABLATION Bilateral    ROTATOR CUFF REPAIR Right    SACROILIAC JOINT ARTHRODESIS Right    SACROILIAC JOINT ARTHRODESIS Bilateral    sacroilliac joint inj     SPIROMETRY       Home Medications:  Prior to Admission medications   Medication Sig Start Date End Date Taking? Authorizing Provider  acetaminophen (TYLENOL) 650 MG CR tablet Take 1,300 mg by mouth every evening.   Yes [provider]  amiodarone (PACERONE) 200 MG tablet Take 1 tablet (200 mg total) by mouth daily. 07/18/22  Yes Camnitz, Will Hassell Done, MD  amLODipine (NORVASC) 5 MG tablet Take 10 mg by mouth daily. 03/29/22  Yes [provider]  apixaban (ELIQUIS) 2.5 MG TABS tablet Take 2.5 mg by mouth 2 (two) times daily.   Yes [provider]  aspirin 81 MG EC tablet Take 81 mg by mouth daily.   Yes [provider]  atorvastatin (LIPITOR) 20 MG tablet Take 20 mg by mouth daily.   Yes [provider]  carvedilol (COREG) 12.5 MG tablet Take 1 tablet  (12.5 mg total) by mouth 2 (two) times daily. 07/17/21  Yes Camnitz, Will Hassell Done, MD  cyanocobalamin (VITAMIN B12) 1000 MCG tablet Take 1,000 mcg by mouth daily.   Yes [provider]  FIBER PO Take 1 tablet by mouth daily.   Yes [provider]  furosemide (LASIX) 40 MG tablet Take 2 tablets (80 mg total) by mouth daily. 05/30/22  Yes Camnitz, Ocie Doyne, MD  hydrALAZINE (APRESOLINE) 25 MG tablet Take 1.5 tablets (37.5 mg total) by mouth 3 (three) times daily. 05/30/22  Yes Camnitz, Ocie Doyne, MD  isosorbide dinitrate (ISORDIL) 20 MG tablet Take 1 tablet (20 mg total) by mouth 3 (three) times daily. 05/30/22  Yes Camnitz, Will Hassell Done, MD  Multiple Vitamins-Minerals (CENTRUM SILVER PO) Take by mouth.   Yes [provider]  Multiple Vitamins-Minerals (PRESERVISION AREDS 2) CAPS Take 1 capsule by mouth 3 (three) times daily.   Yes [provider]  pantoprazole (PROTONIX) 20 MG tablet Take 20 mg by mouth daily.   Yes [provider]  VITAMIN D, CHOLECALCIFEROL, PO Take 500 mcg by mouth daily.   Yes [provider]    Inpatient Medications: Scheduled Meds:  amiodarone  200 mg Oral Daily   apixaban  2.5 mg Oral BID   aspirin EC  81 mg Oral Daily   atorvastatin  20 mg Oral Daily   carvedilol  12.5 mg Oral BID   cholecalciferol  1,000 Units Oral Daily   furosemide  80 mg Oral Daily   pantoprazole  20 mg Oral Daily   Continuous Infusions:  PRN Meds: acetaminophen **OR** acetaminophen  Allergies:   No Known Allergies  Social History:   Social History   Socioeconomic History   Marital status: Widowed    Spouse name: Not on file   Number of children: 4   Years of education: Not on file   Highest education level: 8th grade  Occupational History   Occupation: retired   Tobacco Use   Smoking status: Never   Smokeless tobacco: Never  Vaping Use   Vaping Use: Never used  Substance and Sexual Activity   Alcohol use: Not Currently     Comment: past   Drug use: Never   Sexual activity: Not on file  Other Topics Concern   Not on file  Social History Narrative   Not on file   Social Determinants of Health   Financial Resource Strain: Low Risk  (11/02/2022)   Overall Financial Resource Strain (CARDIA)    Difficulty of Paying Living Expenses: Not very hard  Food Insecurity: No Food Insecurity (11/02/2022)   Hunger Vital Sign    Worried About Running Out of Food in the Last Year: Never true    [place] in the Last Year: Never true  Transportation Needs: No Transportation Needs (04/05/2022)   PRAPARE - Hydrologist (Medical): No    Lack of Transportation (Non-Medical): No  Physical Activity: Not on file  Stress: Not on file  Social Connections: Not on file  Intimate Partner Violence: Not on file    Family History:   Family History  Problem Relation Age of Onset   Diabetes Mother    Obesity Mother    Heart failure Mother    CAD Father    Bone cancer Father    Heart disease Sister    Obesity Brother    Lung cancer Son      ROS:  Please see the history of present illness.  Review of Systems  Constitutional:  Negative for chills and fever.  HENT:  Negative for congestion.   Respiratory:  Positive for cough, sputum production and shortness of breath.   Cardiovascular:  Positive for chest pain and leg swelling. Negative for palpitations and PND.  Gastrointestinal:  Negative for blood in stool, diarrhea, melena, nausea and vomiting.  Genitourinary:  Negative for hematuria.  Musculoskeletal:  Negative for myalgias.  Neurological:  Negative for dizziness and loss of consciousness.  Endo/Heme/Allergies:  Does not bruise/bleed easily.  Psychiatric/Behavioral:  Negative for substance abuse.     Physical Exam/Data:   Vitals:   11/02/22 0324 11/02/22 0405 11/02/22 0836 11/02/22 1127  BP: (!) 151/62  (!) 157/65 129/70  Pulse: (!) 58  60 64  Resp: 20  20 16  Temp: 97.6 F (36.4  C)  (!) 97.5 F (36.4 C) (!) 97.4 F (36.3 C)  TempSrc: Oral  Oral Oral  SpO2: 95%  97% 93%  Weight:  80.8 kg    Height:        Intake/Output Summary (Last 24 hours) at 11/02/2022 1421 Last data filed at 11/02/2022 1300 Gross per 24 hour  Intake 1109.97 ml  Output 3600 ml  Net -2490.03 ml      11/02/2022    4:05 AM 11/01/2022    6:07 AM 10/31/2022    9:37 AM  Last 3 Weights  Weight (lbs) 178 lb 1.6 oz 180 lb 14.4 oz 182 lb  Weight (kg) 80.786 kg 82.056 kg 82.555 kg     Body mass index is 27.08 kg/m.  General: 89 y.o. Caucasian male resting comfortably in no acute distress. HEENT: Normocephalic and atraumatic. Sclera clear. Neck: Supple. No JVD. Heart: RRR. Distinct S1 and S2. No murmurs, gallops, or rubs. Radial pulses 2+ and equal bilaterally. Lungs: No increased work of breathing. Rhonchi noted in bilateral bases but no wheezes or rales.  Abdomen: Soft, non-distended, and non-tender to palpation.  Extremities: No lower extremity edema.    Skin: Warm and dry. Neuro: Alert and oriented x3. No focal deficits. Psych: Normal affect. Responds appropriately.  EKG:  The EKG was personally reviewed and demonstrates:  AV paced rhythm.  Telemetry:  Telemetry was personally reviewed and demonstrates:  AV paced. He two episode this morning of tachycardia with persistent ventriclar pacing with rates in the 110s 120s.  Relevant CV Studies:  Echocardiogram 11/01/2022: Impressions: 1. Left ventricular ejection fraction, by estimation, is 30 to 35%. The  left ventricle has moderately decreased function. The left ventricle  demonstrates regional wall motion abnormalities (see scoring  diagram/findings for description). Left ventricular   diastolic parameters are consistent with Grade II diastolic dysfunction  (pseudonormalization). There is akinesis of the left ventricular,  mid-apical inferior wall, apical segment and anterolateral wall. There is  aneurysmal dilation of the LV apex.   2.  Right ventricular systolic function is moderately reduced. The right  ventricular size is normal. There is normal pulmonary artery systolic  pressure.   3. Left atrial size was mildly dilated.   4. Right atrial size was mildly dilated.   5. The mitral valve is normal in structure. Mild mitral valve  regurgitation. No evidence of mitral stenosis.   6. The aortic valve is tricuspid. There is moderate calcification of the  aortic valve. Aortic valve regurgitation is trivial. Aortic valve  sclerosis/calcification is present, without any evidence of aortic  stenosis.   7. Aortic dilatation noted. There is mild dilatation of the ascending  aorta, measuring 40 mm.   8. The inferior vena cava is normal in size with greater than 50%  respiratory variability, suggesting right atrial pressure of 3 mmHg.    Laboratory Data:  High Sensitivity Troponin:   Recent Labs  Lab 10/31/22 0957 10/31/22 1443  TROPONINIHS 64* 62*     Chemistry Recent Labs  Lab 10/31/22 0957 11/01/22 0109 11/02/22 0222  NA 141 138 140  K 3.5 3.1* 4.0  CL 110 108 109  CO2 19* 21* 24  GLUCOSE 113* 122* 112*  BUN 40* 39* 42*  CREATININE 3.19* 3.17* 3.31*  CALCIUM 8.5* 8.2* 8.3*  MG  --  2.0  --   GFRNONAA 18* 18* 17*  ANIONGAP '12 9 7    '$ No results for input(s): "PROT", "ALBUMIN", "AST", "ALT", "ALKPHOS", "BILITOT"  in the last 168 hours. Lipids No results for input(s): "CHOL", "TRIG", "HDL", "LABVLDL", "LDLCALC", "CHOLHDL" in the last 168 hours.  Hematology Recent Labs  Lab 10/31/22 0957 11/01/22 0109 11/02/22 0222  WBC 6.0 6.9 8.4  RBC 3.88* 3.44* 3.90*  HGB 10.1* 9.1* 10.3*  HCT 33.1* 28.2* 32.3*  MCV 85.3 82.0 82.8  MCH 26.0 26.5 26.4  MCHC 30.5 32.3 31.9  RDW 17.0* 16.8* 16.9*  PLT 209 202 218   Thyroid No results for input(s): "TSH", "FREET4" in the last 168 hours.  BNP Recent Labs  Lab 10/31/22 0957  BNP 1,761.5*    DDimer No results for input(s): "DDIMER" in the last 168  hours.   Radiology/Studies:  ECHOCARDIOGRAM COMPLETE  Result Date: 11/01/2022    ECHOCARDIOGRAM REPORT   Patient Name:   Steven Rosario Date of Exam: 11/01/2022 Medical Rec #:  [id number]     Height:       68.0 in Accession #:    [id number]    Weight:       180.9 lb Date of Birth:  May 28, 1929     BSA:          1.958 m Patient Age:    17 years      BP:           128/58 mmHg Patient Gender: M             HR:           64 bpm. Exam Location:  Inpatient Procedure: 2D Echo, Cardiac Doppler, Color Doppler and Intracardiac            Opacification Agent Indications:    CHF-Acute Diastolic Z66.29  History:        Patient has prior history of Echocardiogram examinations, most                 recent 04/04/2022. CAD, Pacemaker; Risk Factors:Hypertension.  Sonographer:    Bernadene Person RDCS Referring Phys: [id number] [facility]  Sonographer Comments: Image acquisition challenging due to respiratory motion. IMPRESSIONS  1. Left ventricular ejection fraction, by estimation, is 30 to 35%. The left ventricle has moderately decreased function. The left ventricle demonstrates regional wall motion abnormalities (see scoring diagram/findings for description). Left ventricular  diastolic parameters are consistent with Grade II diastolic dysfunction (pseudonormalization). There is akinesis of the left ventricular, mid-apical inferior wall, apical segment and anterolateral wall. 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  2. Right ventricular systolic function is moderately reduced. The right ventricular size is normal. There is normal pulmonary artery systolic pressure.  3. Left atrial size was mildly dilated.  4. Right atrial size was mildly dilated.  5. The mitral valve is normal in structure. Mild mitral valve regurgitation. No evidence of mitral stenosis.  6. The aortic valve is tricuspid. There is moderate calcification of the aortic valve. Aortic valve regurgitation is trivial. Aortic valve sclerosis/calcification is  present, without any evidence of aortic stenosis.  7. Aortic dilatation noted. There is mild dilatation of the ascending aorta, measuring 40 mm.  8. The inferior vena cava is normal in size with greater than 50% respiratory variability, suggesting right atrial pressure of 3 mmHg. FINDINGS  Left Ventricle: Left ventricular ejection fraction, by estimation, is 30 to 35%. The left ventricle has moderately decreased function. The left ventricle demonstrates regional wall motion abnormalities. Definity contrast agent was given IV to delineate the left ventricular endocardial borders. The left ventricular internal cavity size was normal in size. There  is no left ventricular hypertrophy. Left ventricular diastolic parameters are consistent with Grade II diastolic dysfunction (pseudonormalization). Right Ventricle: The right ventricular size is normal. No increase in right ventricular wall thickness. Right ventricular systolic function is moderately reduced. There is normal pulmonary artery systolic pressure. The tricuspid regurgitant velocity is 2.01 m/s, and with an assumed right atrial pressure of 3 mmHg, the estimated right ventricular systolic pressure is 31.4 mmHg. Left Atrium: Left atrial size was mildly dilated. Right Atrium: Right atrial size was mildly dilated. Pericardium: There is no evidence of pericardial effusion. Mitral Valve: The mitral valve is normal in structure. Mild mitral valve regurgitation. No evidence of mitral valve stenosis. Tricuspid Valve: The tricuspid valve is normal in structure. Tricuspid valve regurgitation is trivial. No evidence of tricuspid stenosis. Aortic Valve: The aortic valve is tricuspid. There is moderate calcification of the aortic valve. Aortic valve regurgitation is trivial. Aortic valve sclerosis/calcification is present, without any evidence of aortic stenosis. Pulmonic Valve: The pulmonic valve was normal in structure. Pulmonic valve regurgitation is trivial. No evidence of  pulmonic stenosis. Aorta: Aortic dilatation noted. There is mild dilatation of the ascending aorta, measuring 40 mm. Venous: The inferior vena cava is normal in size with greater than 50% respiratory variability, suggesting right atrial pressure of 3 mmHg. IAS/Shunts: No atrial level shunt detected by color flow Doppler. Additional Comments: A device lead is visualized.  LEFT VENTRICLE PLAX 2D LVIDd:         5.40 cm      Diastology LVIDs:         3.90 cm      LV e' medial:    3.66 cm/s LV PW:         1.00 cm      LV E/e' medial:  21.6 LV IVS:        1.00 cm      LV e' lateral:   3.77 cm/s LVOT diam:     2.10 cm      LV E/e' lateral: 21.0 LV SV:         51 LV SV Index:   26 LVOT Area:     3.46 cm  LV Volumes (MOD) LV vol d, MOD A2C: 134.0 ml LV vol d, MOD A4C: 117.0 ml LV vol s, MOD A2C: 91.3 ml LV vol s, MOD A4C: 83.8 ml LV SV MOD A2C:     42.7 ml LV SV MOD A4C:     117.0 ml LV SV MOD BP:      34.6 ml RIGHT VENTRICLE RV S prime:     8.83 cm/s TAPSE (M-mode): 1.7 cm LEFT ATRIUM             Index        RIGHT ATRIUM           Index LA diam:        4.30 cm 2.20 cm/m   RA Area:     17.60 cm LA Vol (A2C):   62.7 ml 32.02 ml/m  RA Volume:   43.30 ml  22.11 ml/m LA Vol (A4C):   57.4 ml 29.31 ml/m LA Biplane Vol: 62.9 ml 32.12 ml/m  AORTIC VALVE LVOT Vmax:   68.70 cm/s LVOT Vmean:  43.000 cm/s LVOT VTI:    0.148 m  AORTA Ao Root diam: 2.90 cm Ao Asc diam:  4.00 cm MITRAL VALVE                  TRICUSPID VALVE MV Area (  PHT): 3.65 cm       TR Peak grad:   16.2 mmHg MV Decel Time: 208 msec       TR Vmax:        201.00 cm/s MR Peak grad:    90.8 mmHg MR Mean grad:    57.0 mmHg    SHUNTS MR Vmax:         476.50 cm/s  Systemic VTI:  0.15 m MR Vmean:        356.0 cm/s   Systemic Diam: 2.10 cm MR PISA:         1.01 cm MR PISA Eff ROA: 8 mm MR PISA Radius:  0.40 cm MV E velocity: 79.00 cm/s MV A velocity: 36.40 cm/s MV E/A ratio:  2.17 Glori Bickers MD Electronically signed by Glori Bickers MD Signature Date/Time:  11/01/2022/5:50:37 PM    Final    DG Chest 2 View  Result Date: 10/31/2022 CLINICAL DATA:  Onset shortness of breath yesterday. EXAM: CHEST - 2 VIEW COMPARISON:  Single-view of the chest 08/31/2022 and 04/05/2022. FINDINGS: The patient has a moderate right pleural effusion with basilar airspace disease. Trace left pleural effusion and mild basilar atelectasis are also seen. Heart size is normal. No pneumothorax. Pacing device in place. No acute or focal bony abnormality. IMPRESSION: Moderate right pleural effusion with basilar airspace disease which could be due to atelectasis or pneumonia. Trace left pleural effusion also noted. Electronically Signed   By: Inge Rise M.D.   On: 10/31/2022 10:52     Assessment and Plan:   Wide Complex Tachycardia Patient was admitted for acute on chronic CHF and diuresed with IV Lasix with improvement.  However, on telemetry today patient was noted to have a 6-7 minute episode of wide-complex tachycardia.  Cardiology was consulted due to concern for possible VT. - Reviewed telemetry. He had a couple of episodes of tachycardia this morning with persistent ventricular pacing. Rates around 120s at this time. Discussed with Beryle Beams) [place], EP PA-C, who stated this looks like an atrial arrhyhtmia with persistent ventricular pacing but no VT. - Continue home Amiodarone '200mg'$  daily and Coreg 12.'5mg'$  twice daily as below.  Acute on Chronic Combined CHF Patient presented with worsening shortness of breath.  BNP elevated at 1,761.  X-ray showed a moderate right pleural effusion with similar disease which could be due to atelectasis or pneumonia as well as a trace left pleural effusion.  Diuresis IV Lasix with improvement but was stopped today due to worsening renal function. Documented urinary output of 5.2 L yesterday and net -3.8 L this admission. Weight down 4lbs since admission. Creatinine up today from 3.17 to 2.31. Echo showed LVEF of 30-35% with akinesis of  the mid-apical inferior wall, apical segment, and anterolateral wall and grade 2 diastolic dysfunction.  - Does not appear volume overloaded on exam and he is feeling much better. - Agree with stopping IV Lasix and transitioning back to PO Lasix. Can see how he dose of home dose of '80mg'$  daily but may need to increase the dose or switch to Torsemide depending on renal function and urinary output. - No ACEI/ARB/ARNI, MRA, or SGLT2 inhibitor due to renal function.  - Continue Coreg 12.'5mg'$  twice daily.  - Can likely restart home Hydralazine and Isordil.   Paroxysmal Atrial Fibrillation Last device check in 08/2022 showed 7% atrial fibrillation but it was undersensing. Sensitivity was adjusted.  - AV paced.  - Continue Amiodarone '200mg'$  daily.  - Continue Coreg 12.'5mg'$  twice  daily. - Continue chronic anticoagulation with Eliquis 2.'5mg'$  twice daily (reduced dose due to age and renal function).  CAD S/p stenting x2 in 2012. High-sensitivity troponin minimally elevated and flat at 64 >> 62 consistent with demand ischemia. Echo as above. EF does appear slightly down compared to where it was in 03/2022 and wall motion abnormalities are new. - He describes rare episodes of chest tightness that last for 30 seconds at a time and then resolved independently. Occurs randomly and not necessarily related to exertion. - No Aspirin due to need for Eliquis. - Continue beta-blocker and statin. - Chest discomfort does not sounds like ischemia. Not a cath candidate given renal function. Continue to treat medically.  Hypertension BP elevated at times.  - Continue Coreg 12.'5mg'$  twice daily. - Can restart home Hydralazine and Isordil.  CKD Stage IV Creatinine 3.19 on admission. Last known creatinine 3.4 in 07/2022 but prior baseline in 03/2022 was round 2.5 to 2.7. - Creatinine up from 3.17 yesterday to 3.31 today.  - Continue diuresis as above. - Follows with Nephrology as an outpatient.     Risk Assessment/Risk  Scores:    New York Heart Association (NYHA) Functional Class NYHA Class III  CHA2DS2-VASc Score = 5  his indicates a 7.2% annual risk of stroke. The patient's score is based upon: CHF History: 1 HTN History: 1 Diabetes History: 0 Stroke History: 0 Vascular Disease History: 1 Age Score: 2 Gender Score: 0     For questions or updates, please contact [place] Please consult [email or web address] for contact info under    Signed, Darreld Mclean, PA-C  11/02/2022 2:21 PM

## 2022-11-02 NOTE — Progress Notes (Signed)
Heart Failure Navigator Progress Note  Following this hospitalization to assess for HV TOC readiness.   EF 30-35% Patient currently working with PT, Plan to interview today.   Earnestine Leys, BSN, Clinical cytogeneticist Only

## 2022-11-02 NOTE — TOC Transition Note (Signed)
Transition of Care [facility]) - CM/SW Discharge Note   Patient Details  Name: Steven Rosario MRN: [id number] Date of Birth: 11/02/1928  Transition of Care [facility]) CM/SW Contact:  Verdell Carmine, RN Phone Number: 11/02/2022, 3:10 PM   Clinical Narrative:     Patient being DC, spoke with patient about discharge needs/ planning and home health. He does not feel as if he needs DME, is amenable to [facility], Would like someone who works with his insurance and has little co-pay as possible. Tommi Rumps from [place] notified about patient. They have accepted for [facility]  Final next level of care: [place] Barriers to Discharge: No Barriers Identified   Patient Goals and CMS Choice   Choice offered to / list presented to : Patient  Discharge Placement                         Discharge Plan and Services Additional resources added to the After Visit Summary for     Discharge Planning Services: CM Consult                      HH Arranged: PT, OT HH Agency: [place] Date [place]: 11/02/22 Time [place]: 6701 Representative spoke with at [place]: [place] ([place]) Interventions [place]: No Food Insecurity (11/02/2022)  Housing: Low Risk  (11/02/2022)  Transportation Needs: No Transportation Needs (04/05/2022)  Utilities: Not At Risk (11/02/2022)  Alcohol Screen: Low Risk  (11/02/2022)  Financial Resource Strain: Low Risk  (11/02/2022)  Tobacco Use: Low Risk  (08/08/2022)     Readmission Risk Interventions     No data to display

## 2022-11-02 NOTE — TOC Initial Note (Signed)
Transition of Care [facility]) - Initial/Assessment Note    Patient Details  Name: Steven Rosario MRN: [id number] Date of Birth: [birth year]  Transition of Care [facility]) CM/SW Contact:    Verdell Carmine, RN Phone Number: 11/02/2022, 8:38 AM  Clinical Narrative:                 Patient presented with [facility] CHF. History of CHF lives alone and is very functional, drives goes to church Wed and [facility] on Fridays. Has sons that live nearby. Diuresing with lasix IV, Walking saturations revealed no oxygen needs.   CM will follow await PT recommendations  Expected Discharge Plan: [place] Barriers to Discharge: Continued Medical Work up   Patient Goals and CMS Choice            Expected Discharge Plan and Services   Discharge Planning Services: CM Consult   Living arrangements for the past 2 months: [place]                                      Prior Living Arrangements/Services Living arrangements for the past 2 months: Single Family Home Lives with:: Self Patient language and need for interpreter reviewed:: Yes        Need for Family Participation in Patient Care: Yes (Comment) Care giver support system in place?: Yes (comment)   Criminal Activity/Legal Involvement Pertinent to Current Situation/Hospitalization: No - Comment as needed  Activities of Daily Living      Permission Sought/Granted                  Emotional Assessment       Orientation: : Oriented to Self, Oriented to Place, Oriented to  Time Alcohol / Substance Use: Not Applicable Psych Involvement: No (comment)  Admission diagnosis:  Acute exacerbation of CHF (congestive heart failure) (HCC) [I50.9] Chronic kidney disease, unspecified CKD stage [N18.9] Acute on chronic congestive heart failure, unspecified heart failure type ([place]) [I50.9] Patient Active Problem List   Diagnosis Date Noted   Acute on chronic combined systolic and diastolic CHF  (congestive heart failure) ([place]) 04/03/2022   Acute kidney injury superimposed on chronic kidney disease ([place])    Coronary artery disease    Essential hypertension, benign 11/17/2019   Hypercholesteremia 11/17/2019   Pacemaker Saint Jude device implant date 08/23/2017 11/17/2019   Senile arthritis 11/17/2019   Esophageal reflux 11/17/2019   Pernicious anemia 11/17/2019   Gout, unspecified 11/17/2019   PCP:  Angelina Sheriff, MD Pharmacy:   [facility] - [place], [address] [place] [address] Phone: [phone] Fax: [phone]  [id number] [place], [place] - [address] [address]Phone: [phone]: [phone]    Social Determinants of Health (SDOH) Social History: SDOH Screenings   Food Insecurity: No Food Insecurity (04/05/2022)  Housing: Low Risk  (04/05/2022)  Transportation Needs: No Transportation Needs (04/05/2022)  Alcohol Screen: Low Risk  (04/05/2022)  Tobacco Use: Low Risk  (08/08/2022)   SDOH Interventions:     Readmission Risk Interventions     No data to display

## 2022-11-02 NOTE — Progress Notes (Signed)
                 Interval history Noted a tachyarrhythmia on telemetry prior to evaluating this patient this morning.  It appears as though he had approximately 6 minutes of ventricular tachycardia although pacer spikes were detected before each complex.  The patient has no new concerns.  His breathing feels better.  He is able to tolerate laying flat without shortness of breath.  Physical exam Blood pressure (!) 151/62, pulse (!) 58, temperature 97.6 F (36.4 C), temperature source Oral, resp. rate 20, height '5\' 8"'$  (1.727 m), weight 80.8 kg, SpO2 95 %.  Comfortable appearing Heart rate and rhythm initially regular tachycardia, converted to normal rate upon waking up, JVD not visualized, no lower extremity edema Breathing is regular and unlabored Skin is warm and dry Alert and oriented, no gross focal neurologic deficits Pleasant, mood and affect are concordant  Labs, images, and other studies Approximately 6 minutes of wide-complex tachycardia, each complex preceded by a pacer spike, noted on telemetry  Electrolytes and GFR stable  Assessment and plan Hospital day 1  Steven Rosario is a 89 y.o. with chronic systolic and diastolic heart failure admitted for acute decompensated heart failure and diuresing well, noted to have approximately 6 minutes of sustained ventricular tachyarrhythmia this morning.  Principal Problem:   Acute on chronic combined systolic and diastolic CHF (congestive heart failure) (HCC) Active Problems:   Essential hypertension, benign   Pacemaker Saint Jude device implant date 08/23/2017   Acute kidney injury superimposed on chronic kidney disease ([place])   Coronary artery disease  Sustained ventricular tachyarrhythmia A ventricular tachyarrhythmia was first noted on day of admission.  Per review of telemetry on 10/31/2022, this patient had 5 episodes of about 10 seconds of what looked like monomorphic ventricular tachycardia.  Pacemaker device representative  was called, the remote review noted no recorded V. tach as of 10/30/2022.  The remote review also noted V pacing approximately 99% of the time.  This morning, prior to entering the patient's room, it was noted that he was in a similar rhythm on telemetry at approximately 110 bpm.  On further review the rhythm was sustained for approximately 6 minutes.  Interestingly, each complex appeared to be preceded by a pacer spike.  The patient was sleeping comfortably, hemodynamically stable.  Upon waking he spontaneously converted to his usual V-paced rhythm.  Cardiology consultation requested for evaluation of the arrhythmia. - Continue amiodarone 200 mg daily - Beta-blocker as above  Acute decompensated heart failure Symptomatically improved today.  5 L urine output yesterday after IV Lasix.  Based on subjective report and exam, I think this patient is euvolemic.  Will stop IV Lasix and restart home dose. - Furosemide 80 mg p.o. daily - Carvedilol 12.5 mg twice daily - Holding home hydralazine and Imdur  AKI on CKD G4 Remained stable through diuresis.  Atrial fibrillation - Amiodarone - Apixaban 2.5 mg twice daily  Coronary artery disease - Aspirin 81 mg daily - Atorvastatin 20 mg daily  Diet: Regular IVF: None VTE: Apixaban Code: DNR PT/OT recommendations: Home health PT  Discharge plan: Pending evaluation of arrhythmia   [name] 11/02/2022, 6:19 AM  Pager: [phone] After 5pm on weekdays and 1pm on weekends: [phone]

## 2022-11-02 NOTE — Discharge Summary (Cosign Needed)
Name: Steven Rosario MRN: [id number] DOB: 01-20-1929 89 y.o. PCP: Angelina Sheriff, MD  Date of Admission: 10/31/2022  9:32 AM Date of Discharge: 11/02/2022 4:14 PM Attending Physician: Axel Filler, *  Discharge Diagnosis: Active Problems:   Essential hypertension, benign   Pacemaker [facility] Jude device implant date 08/23/2017   Acute kidney injury superimposed on chronic kidney disease [facility])   Coronary artery disease   Arrhythmia   Discharge Medications: Allergies as of 11/02/2022   No Known Allergies      Medication List     TAKE these medications    acetaminophen 650 MG CR tablet Commonly known as: TYLENOL Take 1,300 mg by mouth every evening.   amiodarone 200 MG tablet Commonly known as: PACERONE Take 1 tablet (200 mg total) by mouth daily.   amLODipine 5 MG tablet Commonly known as: NORVASC Take 10 mg by mouth daily.   apixaban 2.5 MG Tabs tablet Commonly known as: ELIQUIS Take 2.5 mg by mouth 2 (two) times daily.   aspirin EC 81 MG tablet Take 81 mg by mouth daily.   atorvastatin 20 MG tablet Commonly known as: LIPITOR Take 20 mg by mouth daily.   carvedilol 12.5 MG tablet Commonly known as: COREG Take 1 tablet (12.5 mg total) by mouth 2 (two) times daily.   cyanocobalamin 1000 MCG tablet Commonly known as: VITAMIN B12 Take 1,000 mcg by mouth daily.   FIBER PO Take 1 tablet by mouth daily.   furosemide 40 MG tablet Commonly known as: LASIX Take 2 tablets (80 mg total) by mouth daily.   hydrALAZINE 25 MG tablet Commonly known as: APRESOLINE Take 1.5 tablets (37.5 mg total) by mouth 3 (three) times daily.   isosorbide dinitrate 20 MG tablet Commonly known as: ISORDIL Take 1 tablet (20 mg total) by mouth 3 (three) times daily.   pantoprazole 20 MG tablet Commonly known as: PROTONIX Take 20 mg by mouth daily.   PreserVision AREDS 2 Caps Take 1 capsule by mouth 3 (three) times daily.   CENTRUM SILVER PO Take by mouth.    VITAMIN D (CHOLECALCIFEROL) PO Take 500 mcg by mouth daily.        Follow-up Appointments:  Follow-up Information     [facility] [place]. Go in 17 day(s).   Specialty: Cardiology Why: Hospital follow up 11/20/22 @ 11 am PLEASE bring a current medication list to appointment FREE valet parking, Entrance C, off [employer] information: [address] [id number] [place] [address] [phone]        Angelina Sheriff, MD. Call.   Specialty: Family Medicine Why: Call for an appointment as soon as possible after leaving the hospital. Contact information: [place] [address] [phone]         Constance Haw, MD. Call.   Specialty: Cardiology Why: Call for an appointment as soon as possible after leaving the hospital. Contact information: [place] [address] [place], [place]. Call.   Why: Call for an appointment as soon as possible after leaving the hospital. Contact information: [place] [address] [phone]         [facility] Follow up.   Why: [place] [facility] Contact information: [address] [employer] [place] [phone]  Disposition and follow-up: [name] is a 89 y.o. year old with atrial fibrillation and AV pacemaker hospitalized for acute on chronic systolic and diastolic heart failure.  Acute decompensated heart failure Adequately diuresed.  No oxygen requirement on discharge.  Home medicines restarted on discharge without changes.  We considered torsemide rather than Lasix given improved bioavailability but the patient expressed some frustration about changes to his diuretic regimen over the course of his recent nephrology and cardiology visits. - Furosemide 80 mg p.o. daily - Carvedilol 12 mg  twice daily - Restarted home hydralazine and Imdur on discharge, which were held for admission - Discharged with home health PT/OT for dyspnea on exertion  AKI on CKD G4 GFR remained stable through diuresis. - Follow-up with [facility]  Atrial tachyarrhythmia Evaluated by cardiology while hospitalized.  No changes to medications.  Recommend follow-up with electrophysiology outpatient. - Amiodarone 200 mg daily - Apixaban 2.5 mg twice daily  Goals of care Completed MOST form with this patient with a DNR.  He wants full scope medical treatment short of CPR, intubation, and feeding tube placement.  Hospital Course by problem list:  Acute decompensated systolic and diastolic heart failure Patient presented with a few days of worsening dyspnea on exertion and orthopnea.  He was volume overloaded on exam.  Chest x-ray was notable for small pleural effusions and some signs of pulmonary edema.  BNP was elevated to approximately 1700.  Echo shows mild decline in LVEF down to 30 to 35%.  ACS ruled out.  IV Lasix were started and titrated to effect.  On hospital day 1 urine output was over 5 L.  On hospital day 2 he was euvolemic and home oral diuretics were restarted.  His symptoms had improved and he was discharged in good condition with home health PT OT.  Atrial tachyarrhythmia On admission I noted several brief 10-second runs of what appeared to be ventricular tachycardia.  I consulted with representative from this patient's pacemaker device company who reported no V. tach as of 10/30/2022.  On hospital day 2 I noted the same rhythm but prolonged for 6 to 7 minutes.  Throughout all of these events the patient was asymptomatic.  Patient's home amiodarone and apixaban had been continued since admission.  I requested cardiology consultation due to concern for sustained V. tach.  Their impression was for an atrial arrhythmia tracked by his pacemaker.  CKD G4 GFR stable through diuresis for volume  overload.  Follows with [facility].  Discharge Exam: Feels well.  Tolerated laying flat on back without shortness of breath.   Blood pressure 126/60, pulse 61, temperature 97.8 F (36.6 C), temperature source Oral, resp. rate 16, height '5\' 8"'$  (1.727 m), weight 80.8 kg, SpO2 93 %.  Comfortable appearing Heart rate and rhythm initially regular tachycardia, converted to normal rate upon waking up, JVD not visualized, no lower extremity edema Breathing is regular and unlabored Skin is warm and dry Alert and oriented, no gross focal neurologic deficits Pleasant, mood and affect are concordant  Pertinent studies and procedures:  TTE IMPRESSION:  1. Left ventricular ejection fraction, by estimation, is 30 to 35%. The  left ventricle has moderately decreased function. The left ventricle  demonstrates regional wall motion abnormalities (see scoring  diagram/findings for description). Left ventricular   diastolic parameters are consistent with Grade II diastolic dysfunction  (pseudonormalization). There is akinesis of the left ventricular,  mid-apical inferior wall, apical segment and anterolateral wall. There is  aneurysmal dilation of  the LV apex.   2. Right ventricular systolic function is moderately reduced. The right  ventricular size is normal. There is normal pulmonary artery systolic  pressure.   3. Left atrial size was mildly dilated.   4. Right atrial size was mildly dilated.   5. The mitral valve is normal in structure. Mild mitral valve  regurgitation. No evidence of mitral stenosis.   6. The aortic valve is tricuspid. There is moderate calcification of the  aortic valve. Aortic valve regurgitation is trivial. Aortic valve  sclerosis/calcification is present, without any evidence of aortic  stenosis.   7. Aortic dilatation noted. There is mild dilatation of the ascending  aorta, measuring 40 mm.   8. The inferior vena cava is normal in size with greater than 50%   respiratory variability, suggesting right atrial pressure of 3 mmHg.    Latest Reference Range & Units 11/02/22 02:22  Sodium 135 - 145 mmol/L 140  Potassium 3.5 - 5.1 mmol/L 4.0  Chloride 98 - 111 mmol/L 109  CO2 22 - 32 mmol/L 24  Glucose 70 - 99 mg/dL 112 (H)  BUN 8 - 23 mg/dL 42 (H)  Creatinine 0.61 - 1.24 mg/dL 3.31 (H)  Calcium 8.9 - 10.3 mg/dL 8.3 (L)  Anion gap 5 - 15  7  GFR, Estimated >60 mL/min 17 (L)  (H): Data is abnormally high (L): Data is abnormally low   Latest Reference Range & Units 11/02/22 02:22  WBC 4.0 - 10.5 K/uL 8.4  RBC 4.22 - 5.81 MIL/uL 3.90 (L)  Hemoglobin 13.0 - 17.0 g/dL 10.3 (L)  HCT 39.0 - 52.0 % 32.3 (L)  MCV 80.0 - 100.0 fL 82.8  MCH 26.0 - 34.0 pg 26.4  MCHC 30.0 - 36.0 g/dL 31.9  RDW 11.5 - 15.5 % 16.9 (H)  Platelets 150 - 400 K/uL 218  nRBC 0.0 - 0.2 % 0.0  (L): Data is abnormally low (H): Data is abnormally high   Latest Reference Range & Units 10/31/22 09:57  B Natriuretic Peptide 0.0 - 100.0 pg/mL 1,761.5 (H)  (H): Data is abnormally high   Latest Reference Range & Units 10/31/22 09:57 10/31/22 14:43  Troponin I (High Sensitivity) <18 ng/L 64 (H) 62 (H)  (H): Data is abnormally high  Discharge Instructions:   Discharge Instructions      [name]  You were evaluated and treated in the hospital for exacerbation of heart failure.  The results of your evaluation showed no obvious underlying cause for this heart failure exacerbation.  It is possible that this is due to worsening of your underlying condition.  You were treated with Lasix through your IV to good effect.  While you are hospitalized we also noticed an abnormal heart rhythm.  Our cardiologists were consulted to evaluate you for this.  They do not think this abnormal rhythm requires a prolonged hospitalization.  We are discharging you now that you are doing better. To help assist you on your road to recovery, I have written the following recommendations:    Continue taking your medicine as prescribed.  I recommend following up with your cardiac electrophysiologist, Dr. Allegra Lai, as soon as possible after leaving the hospital.  Make follow-up appointments with your nephrologist and primary care physician as well.  Our physical and occupational therapists recommended continued therapy at home given your weakness from heart failure.  I have placed a referral for these services.  During your hospital stay, we updated your CODE STATUS, which means the  actions healthcare professionals take if your heart stops beating or you stop breathing. Based on your wishes, you elected to keep your DNR status, which means healthcare professionals will NOT do CPR or place breathing tubes, thus prolonging your life by artificial means. However, you will still receive all medical care indicated for reversible conditions.   It was a privilege to be a part of your hospital care team, and I hope you feel better as a result of your stay.  All the best, Nani Gasser, MD     [name] 11/02/2022, 4:14 PM

## 2022-11-02 NOTE — Progress Notes (Signed)
Heart Failure Nurse Navigator Progress Note  PCP: Angelina Sheriff, MD PCP-Cardiologist: None Admission Diagnosis: Acute on Chronic congestive heart failure, Chronic Kidney disease.  Admitted from: Home  Presentation:   Steven Rosario presented with shortness of breath, fatigue, weight gain +5 pounds. Mild BLE. BP 152/77, HR 63, Creat 3.13, BNP 1,761, Troponin 64. IV lasix given.   Patient has been seen in HF TOC in 04/24/22 for same issues as this admission. Independent, lives alone, drives, He was educated on the sign and symptoms of heart failure, daily weights, when to call his doctor or go to the ED, Diet/ fluid restrictions , patient reported to eating out a lot, 3-4  times per week. Tries to watch his salt when doing that. Patient educated on taking all medications as needed, and attends all medical appointments.   ECHO/ LVEF: 30-35%  Clinical Course:  Past Medical History:  Diagnosis Date   Acute kidney injury (AKI) with acute tubular necrosis (ATN) (HCC)    Cervical disc disease    Chronic kidney disease    Coronary artery disease    Gout    Hypertension    Nephrolithiasis    Pneumonia due to COVID-19 virus    Presence of stent in LAD coronary artery    Prostate cancer (HCC)    S/P placement of cardiac pacemaker    Urinary incontinence      Social History   Socioeconomic History   Marital status: Widowed    Spouse name: Not on file   Number of children: 4   Years of education: Not on file   Highest education level: 8th grade  Occupational History   Occupation: retired   Tobacco Use   Smoking status: Never   Smokeless tobacco: Never  Vaping Use   Vaping Use: Never used  Substance and Sexual Activity   Alcohol use: Not Currently    Comment: past   Drug use: Never   Sexual activity: Not on file  Other Topics Concern   Not on file  Social History Narrative   Not on file   Social Determinants of Health   Financial Resource Strain: Low Risk  (11/02/2022)    Overall Financial Resource Strain (CARDIA)    Difficulty of Paying Living Expenses: Not very hard  Food Insecurity: No Food Insecurity (11/02/2022)   Hunger Vital Sign    Worried About Running Out of Food in the Last Year: Never true    [place] in the Last Year: Never true  Transportation Needs: No Transportation Needs (04/05/2022)   PRAPARE - Hydrologist (Medical): No    Lack of Transportation (Non-Medical): No  Physical Activity: Not on file  Stress: Not on file  Social Connections: Not on file   Education Assessment and Provision:  Detailed education and instructions provided on heart failure disease management including the following:  Signs and symptoms of Heart Failure When to call the physician Importance of daily weights Low sodium diet Fluid restriction Medication management Anticipated future follow-up appointments  Patient education given on each of the above topics.  Patient acknowledges understanding via teach back method and acceptance of all instructions.  Education Materials:  "Living Better With Heart Failure" Booklet, HF zone tool, & Daily Weight Tracker Tool.  Patient has scale at home: yes Patient has pill box at home: Yes    High Risk Criteria for Readmission and/or Poor Patient Outcomes: Heart failure hospital admissions (last 6 months): 1  No  Show rate: 3 % Difficult social situation: No Demonstrates medication adherence: Yes Primary Language: English Literacy level: Graduated 8 th grade, Reading, writing, and comprehension.   Barriers of Care:   Diet/ fluid restrictions ( eats out 3-4 times per week) Daily weights   Considerations/Referrals:   Referral made to Heart Failure Pharmacist Stewardship: Yes Referral made to Heart Failure CSW/NCM TOC: No Referral made to Heart & Vascular TOC clinic: Yes, 11/20/22 @ 11 am  Items for Follow-up on DC/TOC: Diet/ fluid restrictions ( eats out 3-4 days per week) Daily  weights Continued Hf education   Earnestine Leys, BSN, RN Heart Failure Transport planner Only

## 2022-11-06 ENCOUNTER — Telehealth: Payer: Self-pay | Admitting: Student

## 2022-11-06 DIAGNOSIS — I10 Essential (primary) hypertension: Secondary | ICD-10-CM | POA: Diagnosis not present

## 2022-11-06 DIAGNOSIS — K219 Gastro-esophageal reflux disease without esophagitis: Secondary | ICD-10-CM | POA: Diagnosis not present

## 2022-11-06 DIAGNOSIS — I4891 Unspecified atrial fibrillation: Secondary | ICD-10-CM | POA: Diagnosis not present

## 2022-11-06 DIAGNOSIS — I509 Heart failure, unspecified: Secondary | ICD-10-CM | POA: Diagnosis not present

## 2022-11-06 DIAGNOSIS — Z6827 Body mass index (BMI) 27.0-27.9, adult: Secondary | ICD-10-CM | POA: Diagnosis not present

## 2022-11-06 DIAGNOSIS — N183 Chronic kidney disease, stage 3 unspecified: Secondary | ICD-10-CM | POA: Diagnosis not present

## 2022-11-06 DIAGNOSIS — M1990 Unspecified osteoarthritis, unspecified site: Secondary | ICD-10-CM | POA: Diagnosis not present

## 2022-11-06 NOTE — Telephone Encounter (Signed)
Dr. Lin Landsman called in stating he would like to speak to [facility], [place] to give pt clinical update and talk about some recent lab results. He states it is not urgent/critical.   Best call back: [phone]

## 2022-11-07 DIAGNOSIS — N184 Chronic kidney disease, stage 4 (severe): Secondary | ICD-10-CM | POA: Diagnosis not present

## 2022-11-07 DIAGNOSIS — I2510 Atherosclerotic heart disease of native coronary artery without angina pectoris: Secondary | ICD-10-CM | POA: Diagnosis not present

## 2022-11-07 DIAGNOSIS — I5042 Chronic combined systolic (congestive) and diastolic (congestive) heart failure: Secondary | ICD-10-CM | POA: Diagnosis not present

## 2022-11-07 DIAGNOSIS — I130 Hypertensive heart and chronic kidney disease with heart failure and stage 1 through stage 4 chronic kidney disease, or unspecified chronic kidney disease: Secondary | ICD-10-CM | POA: Diagnosis not present

## 2022-11-07 DIAGNOSIS — N179 Acute kidney failure, unspecified: Secondary | ICD-10-CM | POA: Diagnosis not present

## 2022-11-07 DIAGNOSIS — I77810 Thoracic aortic ectasia: Secondary | ICD-10-CM | POA: Diagnosis not present

## 2022-11-07 DIAGNOSIS — I480 Paroxysmal atrial fibrillation: Secondary | ICD-10-CM | POA: Diagnosis not present

## 2022-11-07 DIAGNOSIS — I080 Rheumatic disorders of both mitral and aortic valves: Secondary | ICD-10-CM | POA: Diagnosis not present

## 2022-11-07 DIAGNOSIS — I472 Ventricular tachycardia, unspecified: Secondary | ICD-10-CM | POA: Diagnosis not present

## 2022-11-07 NOTE — Telephone Encounter (Signed)
The patient has been notified of the result and verbalized understanding.  All questions (if any) were answered. Gershon Crane, LPN 11/06/4156 3:09 AM

## 2022-11-12 ENCOUNTER — Ambulatory Visit: Payer: Medicare HMO

## 2022-11-12 DIAGNOSIS — I442 Atrioventricular block, complete: Secondary | ICD-10-CM | POA: Diagnosis not present

## 2022-11-13 LAB — CUP PACEART REMOTE DEVICE CHECK
Battery Remaining Longevity: 48 mo
Battery Remaining Percentage: 44 %
Battery Voltage: 2.96 V
Brady Statistic AP VP Percent: 96 %
Brady Statistic AP VS Percent: 1 %
Brady Statistic AS VP Percent: 3.5 %
Brady Statistic AS VS Percent: 1 %
Brady Statistic RA Percent Paced: 96 %
Brady Statistic RV Percent Paced: 99 %
Date Time Interrogation Session: 20240212020014
Implantable Lead Connection Status: 753985
Implantable Lead Connection Status: 753985
Implantable Lead Implant Date: 20181123
Implantable Lead Implant Date: 20181123
Implantable Lead Location: 753859
Implantable Lead Location: 753860
Implantable Pulse Generator Implant Date: 20181123
Lead Channel Impedance Value: 380 Ohm
Lead Channel Impedance Value: 380 Ohm
Lead Channel Pacing Threshold Amplitude: 0.75 V
Lead Channel Pacing Threshold Amplitude: 0.75 V
Lead Channel Pacing Threshold Pulse Width: 0.5 ms
Lead Channel Pacing Threshold Pulse Width: 0.5 ms
Lead Channel Sensing Intrinsic Amplitude: 1.3 mV
Lead Channel Sensing Intrinsic Amplitude: 12 mV
Lead Channel Setting Pacing Amplitude: 1 V
Lead Channel Setting Pacing Amplitude: 1.75 V
Lead Channel Setting Pacing Pulse Width: 0.5 ms
Lead Channel Setting Sensing Sensitivity: 2 mV
Pulse Gen Model: 2272
Pulse Gen Serial Number: 8957038

## 2022-11-14 DIAGNOSIS — N184 Chronic kidney disease, stage 4 (severe): Secondary | ICD-10-CM | POA: Diagnosis not present

## 2022-11-14 DIAGNOSIS — N2581 Secondary hyperparathyroidism of renal origin: Secondary | ICD-10-CM | POA: Diagnosis not present

## 2022-11-14 DIAGNOSIS — D631 Anemia in chronic kidney disease: Secondary | ICD-10-CM | POA: Diagnosis not present

## 2022-11-14 DIAGNOSIS — I130 Hypertensive heart and chronic kidney disease with heart failure and stage 1 through stage 4 chronic kidney disease, or unspecified chronic kidney disease: Secondary | ICD-10-CM | POA: Diagnosis not present

## 2022-11-14 DIAGNOSIS — I509 Heart failure, unspecified: Secondary | ICD-10-CM | POA: Diagnosis not present

## 2022-11-19 ENCOUNTER — Telehealth (HOSPITAL_COMMUNITY): Payer: Self-pay

## 2022-11-19 DIAGNOSIS — I5042 Chronic combined systolic (congestive) and diastolic (congestive) heart failure: Secondary | ICD-10-CM | POA: Diagnosis not present

## 2022-11-19 DIAGNOSIS — I130 Hypertensive heart and chronic kidney disease with heart failure and stage 1 through stage 4 chronic kidney disease, or unspecified chronic kidney disease: Secondary | ICD-10-CM | POA: Diagnosis not present

## 2022-11-19 DIAGNOSIS — N179 Acute kidney failure, unspecified: Secondary | ICD-10-CM | POA: Diagnosis not present

## 2022-11-19 NOTE — Progress Notes (Deleted)
Electrophysiology Office Note Date: 11/22/2022  ID:  Jermone, Cabiness [birth year], MRN [id number]  PCP: Angelina Sheriff, MD Primary Cardiologist: Jenne Campus, MD Electrophysiologist: Constance Haw, MD   CC: Pacemaker follow-up  Abdulqadir Demmon is a 89 y.o. male seen today for Will Meredith Leeds, MD for post hospital follow up.    Admitted 1/31 - 2/2 with A/C CHF. Diuresed with IV lasix. They had wished to transition him to torsemide, but with multiple recent changes between nephrology and cardiology pt deferred.   Since discharge from hospital the patient reports doing ***.  he denies chest pain, palpitations, dyspnea, PND, orthopnea, nausea, vomiting, dizziness, syncope, edema, weight gain, or early satiety.   Device History: Abbott Dual Chamber PPM implanted 2018 for CHB  Past Medical History:  Diagnosis Date   Acute kidney injury (AKI) with acute tubular necrosis (ATN) (HCC)    Cervical disc disease    Chronic kidney disease    Coronary artery disease    Gout    Hypertension    Nephrolithiasis    Pneumonia due to COVID-19 virus    Presence of stent in LAD coronary artery    Prostate cancer (HCC)    S/P placement of cardiac pacemaker    Urinary incontinence     Current Outpatient Medications  Medication Instructions   acetaminophen (TYLENOL) 1,300 mg, Oral, Every evening   amiodarone (PACERONE) 200 mg, Oral, Daily   amLODipine (NORVASC) 10 mg, Oral, Daily   apixaban (ELIQUIS) 2.5 mg, Oral, 2 times daily   aspirin EC 81 mg, Oral, Daily   atorvastatin (LIPITOR) 20 mg, Oral, Daily   carvedilol (COREG) 12.5 mg, Oral, 2 times daily   cyanocobalamin (VITAMIN B12) 1,000 mcg, Oral, Daily   FIBER PO 1 tablet, Oral, Daily   furosemide (LASIX) 80 mg, Oral, Daily   hydrALAZINE (APRESOLINE) 37.5 mg, Oral, 3 times daily   isosorbide dinitrate (ISORDIL) 20 mg, Oral, 3 times daily   Multiple Vitamins-Minerals (CENTRUM SILVER PO) Oral   Multiple  Vitamins-Minerals (PRESERVISION AREDS 2) CAPS 1 capsule, Oral, 3 times daily   pantoprazole (PROTONIX) 20 mg, Oral, Daily   VITAMIN D, CHOLECALCIFEROL, PO 500 mcg, Oral, Daily  /  Family History: Family History  Problem Relation Age of Onset   Diabetes Mother    Obesity Mother    Heart failure Mother    CAD Father    Bone cancer Father    Heart disease Sister    Obesity Brother    Lung cancer Son     Physical Exam: There were no vitals filed for this visit.   GEN- NAD. A&O x 3. Normal affect HEENT: Normocephalic, atraumatic Lungs- CTAB, Normal effort.  Heart- {EPRHYTHM:28826} rate and rhythm. No M/G/R.  Extremities- {EDEMA LEVEL:28147::"No"} peripheral edema. no clubbing or cyanosis Skin- warm and dry, no rash or lesion, PPM pocket well healed.  PPM Interrogation-  reviewed in detail today,  See PACEART report.  [id number] is not ordered today"}  Other studies Reviewed: Additional studies/ records that were reviewed today include: Previous EP office notes, Previous remote checks, Most recent labwork.   {Select studies to display:26339}  Assessment and Plan:  1. CHB s/p Abbott PPM  Normal PPM function See Pace Art report No changes today  2. Paroxysmal AF Continue amiodarone ***  3. HTN Stable on current regimen   4. Chromic combined CHF Echo 11/01/2022 LVEF 30-35% K 3.4 Hg 8.9 Cr 3.1  Current medicines are reviewed at length with the patient  today.    Labs/ tests ordered today include: *** No orders of the defined types were placed in this encounter.    Disposition:   Follow up with {EPPROVIDERS:28135} {EPFOLLOW UP:28173}   Jacalyn Lefevre, PA-C  11/22/2022 8:12 AM  [facility] [address] [place] [address] [phone] (office) [phone] (fax)

## 2022-11-19 NOTE — Telephone Encounter (Signed)
Called to confirm Heart & Vascular Transitions of Care appointment at 11/20/22. Patient reminded to bring all medications and pill box organizer with them. Gave directions, instructed to utilize [facility] parking. Left message to confirm appointment.

## 2022-11-20 ENCOUNTER — Ambulatory Visit (HOSPITAL_COMMUNITY): Payer: Medicare HMO

## 2022-11-20 DIAGNOSIS — E876 Hypokalemia: Secondary | ICD-10-CM | POA: Diagnosis not present

## 2022-11-20 NOTE — Progress Notes (Incomplete)
HEART & VASCULAR TRANSITION OF CARE CONSULT NOTE     Referring Physician:Dr Maryland Pink Primary Care: Dr Lin Landsman  Primary Cardiologist: Dr Agustin Cree EP : Dr Curt Bears  HPI: Referred to clinic by Dr Evette Doffing, Internal Medicine, for heart failure consultation.   Mr Steven Rosario is a 89 year old with history of HTN, CAD post stents x2 in 2012, paroxysmal atrial fibrillation on Eliquis, ST Jude  dual-chamber pacemaker implanted 08/23/2017 due to sick sinus syndrome and complete heart block, gout, anemia, CKD Stage IV, and chronic combined systolic/diastolic HF. Of note, he relocated from [place] to [place] in 2021 after his wife passed, to live closer to his son. Most of his cardiac conditions were diagnosed in [place]. He is now followed by Dr. Syble Creek and Dr. Curt Bears.   Admitted to [facility] 04/03/22 with A/C HFrEF. EF 35-40% RV ok Grade II DD (no baseline study for comparison) . Diuresed with IV lasix and later transitioned to po lasix. GDMT limited by CKD Stage IV. Was referred to [facility]. At f/u visit, volume status was stable and he was referred back to general cardiology for ongoing management.   Readmitted again 1/24 for a/c CHF. Echo showed mild decline in LVEF down to 30 to 35%. He was also noted to have a WCT during admission. He was seen by cardiology and wide complex on tele was not felt to be VT but rather upper rate tracking of his pacemaker. He was continued on amiodarone and once diuresed switched back to PO Lasix, with plans to follow-up w/ his nephrologist post hospital and recommendations to consider switch to torsemide in the future for better bioavailability if needed. Referred back to TOC at d/c. D/c Wt  177 lb. D/c Scr 3.3 c/w b/l.   Of note, he had recent outpatient device transmission done on 2/12 that showed 3.2% AT/AF burden and high pacing percentage, 99%. He is scheduled for f/u w/ device clinic later this week.    He presents today for f/u.    Cardiac Testing   Echo 7/23 EF 35-40% RV ok Grade II DD.  Echo 2/24 EF 30-35%, RV mod reduced, mild MR   Review of Systems: [y] = yes, [ ]$  = no   General: Weight gain [ ]$ ; Weight loss [ ]$ ; Anorexia [ ]$ ; Fatigue [ ]$ ; Fever [ ]$ ; Chills [ ]$ ; Weakness [ ]$   Cardiac: Chest pain/pressure [ ]$ ; Resting SOB [ ]$ ; Exertional SOB [ Y]; Orthopnea [ ]$ ; Pedal Edema [ ]$ ; Palpitations [ ]$ ; Syncope [ ]$ ; Presyncope [ ]$ ; Paroxysmal nocturnal dyspnea[ ]$   Pulmonary: Cough [ ]$ ; Wheezing[ ]$ ; Hemoptysis[ ]$ ; Sputum [ ]$ ; Snoring [ ]$   GI: Vomiting[ ]$ ; Dysphagia[ ]$ ; Melena[ ]$ ; Hematochezia [ ]$ ; Heartburn[ ]$ ; Abdominal pain [ ]$ ; Constipation [ ]$ ; Diarrhea [ ]$ ; BRBPR [ ]$   GU: Hematuria[ ]$ ; Dysuria [ ]$ ; Nocturia[ ]$   Vascular: Pain in legs with walking [ ]$ ; Pain in feet with lying flat [ ]$ ; Non-healing sores [ ]$ ; Stroke [ ]$ ; TIA [ ]$ ; Slurred speech [ ]$ ;  Neuro: Headaches[ ]$ ; Vertigo[ ]$ ; Seizures[ ]$ ; Paresthesias[ ]$ ;Blurred vision [ ]$ ; Diplopia [ ]$ ; Vision changes [ ]$   Ortho/Skin: Arthritis [ ]$ ; Joint pain [ Y]; Muscle pain [ ]$ ; Joint swelling [ ]$ ; Back Pain [ Y]; Rash [ ]$   Psych: Depression[ ]$ ; Anxiety[ ]$   Heme: Bleeding problems [ ]$ ; Clotting disorders [ ]$ ; Anemia [ ]$   Endocrine: Diabetes [ ]$ ; Thyroid dysfunction[ ]$    Past Medical History:  Diagnosis Date   Acute kidney injury (AKI) with acute tubular necrosis (ATN) (HCC)    Cervical disc disease    Chronic kidney disease    Coronary artery disease    Gout    Hypertension    Nephrolithiasis    Pneumonia due to COVID-19 virus    Presence of stent in LAD coronary artery    Prostate cancer (HCC)    S/P placement of cardiac pacemaker    Urinary incontinence     Current Outpatient Medications  Medication Sig Dispense Refill   acetaminophen (TYLENOL) 650 MG CR tablet Take 1,300 mg by mouth every evening.     amiodarone (PACERONE) 200 MG tablet Take 1 tablet (200 mg total) by mouth daily. 90 tablet 3   amLODipine (NORVASC) 5 MG tablet Take 10 mg by mouth daily.     apixaban  (ELIQUIS) 2.5 MG TABS tablet Take 2.5 mg by mouth 2 (two) times daily.     aspirin 81 MG EC tablet Take 81 mg by mouth daily.     atorvastatin (LIPITOR) 20 MG tablet Take 20 mg by mouth daily.     carvedilol (COREG) 12.5 MG tablet Take 1 tablet (12.5 mg total) by mouth 2 (two) times daily. 60 tablet 11   cyanocobalamin (VITAMIN B12) 1000 MCG tablet Take 1,000 mcg by mouth daily.     FIBER PO Take 1 tablet by mouth daily.     furosemide (LASIX) 40 MG tablet Take 2 tablets (80 mg total) by mouth daily. 180 tablet 3   hydrALAZINE (APRESOLINE) 25 MG tablet Take 1.5 tablets (37.5 mg total) by mouth 3 (three) times daily. 405 tablet 3   isosorbide dinitrate (ISORDIL) 20 MG tablet Take 1 tablet (20 mg total) by mouth 3 (three) times daily. 270 tablet 3   Multiple Vitamins-Minerals (CENTRUM SILVER PO) Take by mouth.     Multiple Vitamins-Minerals (PRESERVISION AREDS 2) CAPS Take 1 capsule by mouth 3 (three) times daily.     pantoprazole (PROTONIX) 20 MG tablet Take 20 mg by mouth daily.     VITAMIN D, CHOLECALCIFEROL, PO Take 500 mcg by mouth daily.     No current facility-administered medications for this visit.    No Known Allergies    Social History   Socioeconomic History   Marital status: Widowed    Spouse name: Not on file   Number of children: 4   Years of education: Not on file   Highest education level: 8th grade  Occupational History   Occupation: retired   Tobacco Use   Smoking status: Never   Smokeless tobacco: Never  Vaping Use   Vaping Use: Never used  Substance and Sexual Activity   Alcohol use: Not Currently    Comment: past   Drug use: Never   Sexual activity: Not on file  Other Topics Concern   Not on file  Social History Narrative   Not on file   Social Determinants of Health   Financial Resource Strain: Low Risk  (11/02/2022)   Overall Financial Resource Strain (CARDIA)    Difficulty of Paying Living Expenses: Not very hard  Food Insecurity: No Food  Insecurity (11/02/2022)   Hunger Vital Sign    Worried About Running Out of Food in the Last Year: Never true    [place] in the Last Year: Never true  Transportation Needs: No Transportation Needs (04/05/2022)   PRAPARE - Transportation    Lack of Transportation (Medical): No    Lack  of Transportation (Non-Medical): No  Physical Activity: Not on file  Stress: Not on file  Social Connections: Not on file  Intimate Partner Violence: Not on file      Family History  Problem Relation Age of Onset   Diabetes Mother    Obesity Mother    Heart failure Mother    CAD Father    Bone cancer Father    Heart disease Sister    Obesity Brother    Lung cancer Son     There were no vitals filed for this visit.  Wt Readings from Last 3 Encounters:  11/02/22 80.8 kg (178 lb 1.6 oz)  08/08/22 86.2 kg (190 lb)  07/11/22 83.8 kg (184 lb 12.8 oz)   PHYSICAL EXAM: General:  Well appearing. No respiratory difficulty HEENT: normal Neck: supple. no JVD. Carotids 2+ bilat; no bruits. No lymphadenopathy or thyromegaly appreciated. Cor: PMI nondisplaced. Regular rate & rhythm. No rubs, gallops or murmurs. Lungs: clear Abdomen: soft, nontender, nondistended. No hepatosplenomegaly. No bruits or masses. Good bowel sounds. Extremities: no cyanosis, clubbing, rash, edema Neuro: alert & oriented x 3, cranial nerves grossly intact. moves all 4 extremities w/o difficulty. Affect pleasant.   ASSESSMENT & PLAN:  1.Chronic combined systolic/diastolic HF - Etiology uncertain. ? Primarily ischemic CM vs mixed ischemic and nonischemic. Has reported h/o CAD s/p previous stenting in 2012 while living in [place]. Also h/o CHB s/p PPM. Recently established cardiac care in [place] after relocating in 2021. Per [facility] clinic notes, they were unsuccessful in obtaining prior cardiac records. Unsure how long his EF has been reduced. Had Echo done at [facility] 7/23 EF 35-40%, Grade IIDD. Recent repeat echo 2/24 EF 30-35%, RV mod  reduced. This is in the setting of high RV pacing. Recent device interrogation 2/12 showed 99% RV pacing percentage. ? If PICM. He is on both amiodarone and coreg for h/o Afib, only 3.2% AT/AF burden on recent interrogation.  - he has close f/u in EP clinic later this week. Will see if they can optimize device settings in an effort to lower pacing percentage vs reducing dose of ? blocker to reduce amount of pacing. Also ? Upgrade to CRT-P, though age may prohibit and risk may > benefit. Will defer to EP    NYHA III.  GDMT limited by CKD Stage IV Volume status ok.  Diuretic- Volume status ok. Continue lasix 80 mg daily. Discussed taking an extra 40 mg lasix if weight goes up 3 pounds in 24 hours.  BB- Cotnine carvedilol 12.5 mg twice a day  Ace/ARB/ARNI- No CKD Stage IV MRA- No CKD Stage IV SGLT2i- Hold off,  Continue hydralazinee 37.5 mg three times a day + isordil 20 mg three times a day.   2. CKD Stage IV - B/l SCr ~3.2  - followed by [facility] - check BMP today   3. HTN  - Stable.   4. PAF -Continue amio 200 mg daily.  -Continue eliquis   5. CAD - s/p remote stenting done in [place]. Details unknown - denies ischemic symptoms  - cont medical management. On statin + ? blocker. No ASA given need for Eliquis     Referred to HFSW (PCP, Medications, Transportation, ETOH Abuse, Drug Abuse, Insurance, Financial ): No Refer to Pharmacy:  No Refer to Home Health:  No Refer to Advanced Heart Failure Clinic: No Refer to General Cardiology: [facility] Patient.     Knowledge Escandon PA-C  9:49 AM

## 2022-11-21 DIAGNOSIS — I080 Rheumatic disorders of both mitral and aortic valves: Secondary | ICD-10-CM | POA: Diagnosis not present

## 2022-11-21 DIAGNOSIS — I77810 Thoracic aortic ectasia: Secondary | ICD-10-CM | POA: Diagnosis not present

## 2022-11-21 DIAGNOSIS — I5042 Chronic combined systolic (congestive) and diastolic (congestive) heart failure: Secondary | ICD-10-CM | POA: Diagnosis not present

## 2022-11-21 DIAGNOSIS — I2510 Atherosclerotic heart disease of native coronary artery without angina pectoris: Secondary | ICD-10-CM | POA: Diagnosis not present

## 2022-11-21 DIAGNOSIS — N184 Chronic kidney disease, stage 4 (severe): Secondary | ICD-10-CM | POA: Diagnosis not present

## 2022-11-21 DIAGNOSIS — N179 Acute kidney failure, unspecified: Secondary | ICD-10-CM | POA: Diagnosis not present

## 2022-11-21 DIAGNOSIS — I472 Ventricular tachycardia, unspecified: Secondary | ICD-10-CM | POA: Diagnosis not present

## 2022-11-21 DIAGNOSIS — I480 Paroxysmal atrial fibrillation: Secondary | ICD-10-CM | POA: Diagnosis not present

## 2022-11-21 DIAGNOSIS — I130 Hypertensive heart and chronic kidney disease with heart failure and stage 1 through stage 4 chronic kidney disease, or unspecified chronic kidney disease: Secondary | ICD-10-CM | POA: Diagnosis not present

## 2022-11-21 NOTE — Progress Notes (Deleted)
Cardiology Office Note Date:  11/21/2022  Patient ID:  Steven Rosario, Steven Rosario 1929/01/25, MRN [id number] PCP:  Steven Sheriff, MD  Cardiologist:  None Electrophysiologist: Will Meredith Leeds, MD  ***refresh   Chief Complaint: ***  History of Present Illness: Steven Rosario is a 89 y.o. male with PMH notable for HFrEF, CHB s/p PPM, parox afib, HTN, CAD s/p PCI, CKD stage 4; seen today for Will Meredith Leeds, MD for post hospital follow up.    Admitted 1/31-11/02/2021 for AoC HF exacerbation. Concern for atrial arrhythmia on 2/2, pt asymptomatic and resolved spontaneously. DC on 37m lasix daily.  *** fluid status *** weight at home / dry weight *** activity tolerance *** AF symptoms *** bleeding concerns  *** needs amio labs (no LFTs or TSH in hospital)  Since discharge from hospital the patient reports doing ***.  he denies chest pain, palpitations, dyspnea, PND, orthopnea, nausea, vomiting, dizziness, syncope, edema, weight gain, or early satiety.     Device Information: St. Jude dual chamber PPM, imp 2018; dx CHB  Past Medical History:  Diagnosis Date   Acute kidney injury (AKI) with acute tubular necrosis (ATN) (HCC)    Cervical disc disease    Chronic kidney disease    Coronary artery disease    Gout    Hypertension    Nephrolithiasis    Pneumonia due to COVID-19 virus    Presence of stent in LAD coronary artery    Prostate cancer (HCC)    S/P placement of cardiac pacemaker    Urinary incontinence     Past Surgical History:  Procedure Laterality Date   ANKLE FRACTURE SURGERY Right    cardiac stents x2     caudal epidural injection     CERVICAL DISCECTOMY     COLONOSCOPY     EXPLORATORY LAPAROTOMY     lumbar facet joint injection Bilateral    medical branch block Bilateral    PROSTATECTOMY     RADIOFREQUENCY ABLATION Bilateral    ROTATOR CUFF REPAIR Right    SACROILIAC JOINT ARTHRODESIS Right    SACROILIAC JOINT ARTHRODESIS Bilateral    sacroilliac  joint inj     SPIROMETRY      Current Outpatient Medications  Medication Instructions   acetaminophen (TYLENOL) 1,300 mg, Oral, Every evening   amiodarone (PACERONE) 200 mg, Oral, Daily   amLODipine (NORVASC) 10 mg, Oral, Daily   apixaban (ELIQUIS) 2.5 mg, Oral, 2 times daily   aspirin EC 81 mg, Oral, Daily   atorvastatin (LIPITOR) 20 mg, Oral, Daily   carvedilol (COREG) 12.5 mg, Oral, 2 times daily   cyanocobalamin (VITAMIN B12) 1,000 mcg, Oral, Daily   FIBER PO 1 tablet, Oral, Daily   furosemide (LASIX) 80 mg, Oral, Daily   hydrALAZINE (APRESOLINE) 37.5 mg, Oral, 3 times daily   isosorbide dinitrate (ISORDIL) 20 mg, Oral, 3 times daily   Multiple Vitamins-Minerals (CENTRUM SILVER PO) Oral   Multiple Vitamins-Minerals (PRESERVISION AREDS 2) CAPS 1 capsule, Oral, 3 times daily   pantoprazole (PROTONIX) 20 mg, Oral, Daily   VITAMIN D, CHOLECALCIFEROL, PO 500 mcg, Oral, Daily      Social History:  The patient  reports that he has never smoked. He has never used smokeless tobacco. He reports that he does not currently use alcohol. He reports that he does not use drugs.   Family History:  *** include only if pertinent The patient's family history includes Bone cancer in his father; CAD in his father; Diabetes in his mother;  Heart disease in his sister; Heart failure in his mother; Lung cancer in his son; Obesity in his brother and mother.***  ROS:  Please see the history of present illness. All other systems are reviewed and otherwise negative.   PHYSICAL EXAM: *** VS:  There were no vitals taken for this visit. BMI: There is no height or weight on file to calculate BMI.  GEN- The patient is well appearing, alert and oriented x 3 today.   HEENT: normocephalic, atraumatic; sclera clear, conjunctiva pink; hearing intact; oropharynx clear; neck supple, no JVP Lungs- Clear to ausculation bilaterally, normal work of breathing.  No wheezes, rales, rhonchi Heart- {Blank  single:19197::"Regular","Irregularly irregular"} rate and rhythm, no murmurs, rubs or gallops, PMI not laterally displaced GI- soft, non-tender, non-distended, bowel sounds present, no hepatosplenomegaly Extremities- {EDEMA LEVEL:28147::"No"} peripheral edema. no clubbing or cyanosis; DP/PT/radial pulses 2+ bilaterally MS- no significant deformity or atrophy Skin- warm and dry, no rash or lesion, *** device pocket well-healed Psych- euthymic mood, full affect Neuro- strength and sensation are intact   Device interrogation done today and reviewed by myself:  Battery *** Lead thresholds, impedence, sensing stable *** *** episodes *** changes made today  EKG is not ordered.   Recent Labs: 07/11/2022: ALT 16; TSH 1.960 10/31/2022: B Natriuretic Peptide 1,761.5 11/01/2022: Magnesium 2.0 11/02/2022: BUN 42; Creatinine, Ser 3.31; Hemoglobin 10.3; Platelets 218; Potassium 4.0; Sodium 140  No results found for requested labs within last 365 days.   CrCl cannot be calculated (Unknown ideal weight.).   Wt Readings from Last 3 Encounters:  11/02/22 178 lb 1.6 oz (80.8 kg)  08/08/22 190 lb (86.2 kg)  07/11/22 184 lb 12.8 oz (83.8 kg)     Additional studies reviewed include: Previous EP, cardiology notes.   TTE, 11/01/2022 1. Left ventricular ejection fraction, by estimation, is 30 to 35%. The left ventricle has moderately decreased function. The left ventricle demonstrates regional wall motion abnormalities (see scoring diagram/findings for description). Left ventricular diastolic parameters are consistent with Grade II diastolic dysfunction (pseudonormalization). There is akinesis of the left ventricular, mid-apical inferior wall, apical segment and anterolateral wall. 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   2. Right ventricular systolic function is moderately reduced. The right ventricular size is normal. There is normal pulmonary artery systolic pressure.   3. Left atrial size was mildly  dilated.   4. Right atrial size was mildly dilated.   5. The mitral valve is normal in structure. Mild mitral valve regurgitation. No evidence of mitral stenosis.   6. The aortic valve is tricuspid. There is moderate calcification of the aortic valve. Aortic valve regurgitation is trivial. Aortic valve sclerosis/calcification is present, without any evidence of aortic stenosis.   7. Aortic dilatation noted. There is mild dilatation of the ascending aorta, measuring 40 mm.   8. The inferior vena cava is normal in size with greater than 50% respiratory variability, suggesting right atrial pressure of 3 mmHg.   ASSESSMENT AND PLAN:  #) CHB s/p St. Jude PPM Device functioning well, see paceart for details  #) HFrEF   #) parox Afib On 211m amiodarone daily  Needs updated amio labs  CHA2DS2-VASc Score = 5 [CHF History: 1, HTN History: 1, Diabetes History: 0, Stroke History: 0, Vascular Disease History: 1, Age Score: 2, Gender Score: 0].  Therefore, the patient's annual risk of stroke is 7.2 % OAC - eliquis 2.511mBID, appropriately dose reduce       {Confirm score is correct.  If not,  click here to update score.  REFRESH note.  :1}      Current medicines are reviewed at length with the patient today.   The patient {ACTIONS; HAS/DOES NOT HAVE:19233} concerns regarding his medicines.  The following changes were made today:  {NONE DEFAULTED:18576}  Labs/ tests ordered today include: *** No orders of the defined types were placed in this encounter.    Disposition: Follow up with {EPMDS:28135} in {EPFOLLOW UP:28173}   Signed, Mamie Levers, NP  11/21/22  7:34 PM  Electrophysiology [facility]

## 2022-11-22 ENCOUNTER — Ambulatory Visit: Payer: Medicare HMO | Attending: Student | Admitting: Student

## 2022-11-22 DIAGNOSIS — I5022 Chronic systolic (congestive) heart failure: Secondary | ICD-10-CM

## 2022-11-22 DIAGNOSIS — I442 Atrioventricular block, complete: Secondary | ICD-10-CM

## 2022-11-22 DIAGNOSIS — N184 Chronic kidney disease, stage 4 (severe): Secondary | ICD-10-CM

## 2022-11-22 DIAGNOSIS — I480 Paroxysmal atrial fibrillation: Secondary | ICD-10-CM

## 2022-11-28 ENCOUNTER — Other Ambulatory Visit: Payer: Self-pay

## 2022-11-28 ENCOUNTER — Emergency Department (HOSPITAL_COMMUNITY): Payer: Medicare HMO

## 2022-11-28 ENCOUNTER — Inpatient Hospital Stay (HOSPITAL_COMMUNITY)
Admission: EM | Admit: 2022-11-28 | Discharge: 2022-12-04 | DRG: 291 | Disposition: A | Discharge status: Home Health | Payer: Medicare HMO | Attending: Internal Medicine | Admitting: Internal Medicine

## 2022-11-28 ENCOUNTER — Encounter (HOSPITAL_COMMUNITY): Payer: Self-pay | Admitting: Internal Medicine

## 2022-11-28 DIAGNOSIS — Z955 Presence of coronary angioplasty implant and graft: Secondary | ICD-10-CM

## 2022-11-28 DIAGNOSIS — D631 Anemia in chronic kidney disease: Secondary | ICD-10-CM | POA: Diagnosis not present

## 2022-11-28 DIAGNOSIS — R0902 Hypoxemia: Secondary | ICD-10-CM | POA: Diagnosis present

## 2022-11-28 DIAGNOSIS — I442 Atrioventricular block, complete: Secondary | ICD-10-CM | POA: Diagnosis present

## 2022-11-28 DIAGNOSIS — I2510 Atherosclerotic heart disease of native coronary artery without angina pectoris: Secondary | ICD-10-CM | POA: Diagnosis present

## 2022-11-28 DIAGNOSIS — D124 Benign neoplasm of descending colon: Secondary | ICD-10-CM | POA: Diagnosis not present

## 2022-11-28 DIAGNOSIS — I10 Essential (primary) hypertension: Secondary | ICD-10-CM | POA: Diagnosis not present

## 2022-11-28 DIAGNOSIS — E869 Volume depletion, unspecified: Secondary | ICD-10-CM | POA: Diagnosis present

## 2022-11-28 DIAGNOSIS — K317 Polyp of stomach and duodenum: Secondary | ICD-10-CM | POA: Diagnosis not present

## 2022-11-28 DIAGNOSIS — Z87442 Personal history of urinary calculi: Secondary | ICD-10-CM

## 2022-11-28 DIAGNOSIS — E559 Vitamin D deficiency, unspecified: Secondary | ICD-10-CM | POA: Diagnosis present

## 2022-11-28 DIAGNOSIS — K2970 Gastritis, unspecified, without bleeding: Secondary | ICD-10-CM | POA: Diagnosis not present

## 2022-11-28 DIAGNOSIS — N179 Acute kidney failure, unspecified: Secondary | ICD-10-CM | POA: Diagnosis not present

## 2022-11-28 DIAGNOSIS — K219 Gastro-esophageal reflux disease without esophagitis: Secondary | ICD-10-CM | POA: Diagnosis present

## 2022-11-28 DIAGNOSIS — K635 Polyp of colon: Secondary | ICD-10-CM | POA: Diagnosis not present

## 2022-11-28 DIAGNOSIS — I130 Hypertensive heart and chronic kidney disease with heart failure and stage 1 through stage 4 chronic kidney disease, or unspecified chronic kidney disease: Principal | ICD-10-CM | POA: Diagnosis present

## 2022-11-28 DIAGNOSIS — I5041 Acute combined systolic (congestive) and diastolic (congestive) heart failure: Secondary | ICD-10-CM | POA: Diagnosis not present

## 2022-11-28 DIAGNOSIS — D125 Benign neoplasm of sigmoid colon: Secondary | ICD-10-CM | POA: Diagnosis not present

## 2022-11-28 DIAGNOSIS — D509 Iron deficiency anemia, unspecified: Secondary | ICD-10-CM | POA: Diagnosis not present

## 2022-11-28 DIAGNOSIS — K921 Melena: Secondary | ICD-10-CM | POA: Diagnosis present

## 2022-11-28 DIAGNOSIS — N289 Disorder of kidney and ureter, unspecified: Secondary | ICD-10-CM | POA: Diagnosis not present

## 2022-11-28 DIAGNOSIS — D649 Anemia, unspecified: Secondary | ICD-10-CM | POA: Diagnosis not present

## 2022-11-28 DIAGNOSIS — Z950 Presence of cardiac pacemaker: Secondary | ICD-10-CM

## 2022-11-28 DIAGNOSIS — K2971 Gastritis, unspecified, with bleeding: Secondary | ICD-10-CM | POA: Diagnosis present

## 2022-11-28 DIAGNOSIS — Z833 Family history of diabetes mellitus: Secondary | ICD-10-CM

## 2022-11-28 DIAGNOSIS — D122 Benign neoplasm of ascending colon: Secondary | ICD-10-CM | POA: Diagnosis present

## 2022-11-28 DIAGNOSIS — Z8616 Personal history of COVID-19: Secondary | ICD-10-CM

## 2022-11-28 DIAGNOSIS — I509 Heart failure, unspecified: Secondary | ICD-10-CM | POA: Diagnosis not present

## 2022-11-28 DIAGNOSIS — I4891 Unspecified atrial fibrillation: Secondary | ICD-10-CM | POA: Diagnosis present

## 2022-11-28 DIAGNOSIS — J9811 Atelectasis: Secondary | ICD-10-CM | POA: Diagnosis not present

## 2022-11-28 DIAGNOSIS — Z801 Family history of malignant neoplasm of trachea, bronchus and lung: Secondary | ICD-10-CM | POA: Diagnosis not present

## 2022-11-28 DIAGNOSIS — Z7982 Long term (current) use of aspirin: Secondary | ICD-10-CM

## 2022-11-28 DIAGNOSIS — Z79899 Other long term (current) drug therapy: Secondary | ICD-10-CM | POA: Diagnosis not present

## 2022-11-28 DIAGNOSIS — D510 Vitamin B12 deficiency anemia due to intrinsic factor deficiency: Secondary | ICD-10-CM | POA: Diagnosis present

## 2022-11-28 DIAGNOSIS — R195 Other fecal abnormalities: Secondary | ICD-10-CM | POA: Diagnosis not present

## 2022-11-28 DIAGNOSIS — I110 Hypertensive heart disease with heart failure: Secondary | ICD-10-CM | POA: Diagnosis not present

## 2022-11-28 DIAGNOSIS — E785 Hyperlipidemia, unspecified: Secondary | ICD-10-CM | POA: Diagnosis present

## 2022-11-28 DIAGNOSIS — Z9079 Acquired absence of other genital organ(s): Secondary | ICD-10-CM

## 2022-11-28 DIAGNOSIS — M109 Gout, unspecified: Secondary | ICD-10-CM | POA: Diagnosis present

## 2022-11-28 DIAGNOSIS — Z8701 Personal history of pneumonia (recurrent): Secondary | ICD-10-CM

## 2022-11-28 DIAGNOSIS — D126 Benign neoplasm of colon, unspecified: Secondary | ICD-10-CM | POA: Diagnosis not present

## 2022-11-28 DIAGNOSIS — K649 Unspecified hemorrhoids: Secondary | ICD-10-CM | POA: Diagnosis present

## 2022-11-28 DIAGNOSIS — I5021 Acute systolic (congestive) heart failure: Secondary | ICD-10-CM | POA: Diagnosis not present

## 2022-11-28 DIAGNOSIS — Z8249 Family history of ischemic heart disease and other diseases of the circulatory system: Secondary | ICD-10-CM | POA: Diagnosis not present

## 2022-11-28 DIAGNOSIS — R0602 Shortness of breath: Secondary | ICD-10-CM | POA: Diagnosis not present

## 2022-11-28 DIAGNOSIS — T39015A Adverse effect of aspirin, initial encounter: Secondary | ICD-10-CM | POA: Diagnosis present

## 2022-11-28 DIAGNOSIS — N184 Chronic kidney disease, stage 4 (severe): Secondary | ICD-10-CM | POA: Diagnosis not present

## 2022-11-28 DIAGNOSIS — I119 Hypertensive heart disease without heart failure: Secondary | ICD-10-CM | POA: Diagnosis not present

## 2022-11-28 DIAGNOSIS — J90 Pleural effusion, not elsewhere classified: Secondary | ICD-10-CM | POA: Diagnosis not present

## 2022-11-28 DIAGNOSIS — Z7901 Long term (current) use of anticoagulants: Secondary | ICD-10-CM

## 2022-11-28 DIAGNOSIS — N189 Chronic kidney disease, unspecified: Secondary | ICD-10-CM | POA: Diagnosis not present

## 2022-11-28 DIAGNOSIS — D120 Benign neoplasm of cecum: Secondary | ICD-10-CM | POA: Diagnosis present

## 2022-11-28 DIAGNOSIS — Z8546 Personal history of malignant neoplasm of prostate: Secondary | ICD-10-CM

## 2022-11-28 DIAGNOSIS — D123 Benign neoplasm of transverse colon: Secondary | ICD-10-CM | POA: Diagnosis not present

## 2022-11-28 DIAGNOSIS — I5023 Acute on chronic systolic (congestive) heart failure: Principal | ICD-10-CM

## 2022-11-28 DIAGNOSIS — R0600 Dyspnea, unspecified: Secondary | ICD-10-CM | POA: Diagnosis not present

## 2022-11-28 DIAGNOSIS — I5043 Acute on chronic combined systolic (congestive) and diastolic (congestive) heart failure: Secondary | ICD-10-CM | POA: Diagnosis present

## 2022-11-28 DIAGNOSIS — K5900 Constipation, unspecified: Secondary | ICD-10-CM | POA: Diagnosis present

## 2022-11-28 DIAGNOSIS — D374 Neoplasm of uncertain behavior of colon: Secondary | ICD-10-CM | POA: Diagnosis not present

## 2022-11-28 LAB — CBC
HCT: 27.5 % — ABNORMAL LOW (ref 39.0–52.0)
Hemoglobin: 8.6 g/dL — ABNORMAL LOW (ref 13.0–17.0)
MCH: 25.8 pg — ABNORMAL LOW (ref 26.0–34.0)
MCHC: 31.3 g/dL (ref 30.0–36.0)
MCV: 82.6 fL (ref 80.0–100.0)
Platelets: 207 10*3/uL (ref 150–400)
RBC: 3.33 MIL/uL — ABNORMAL LOW (ref 4.22–5.81)
RDW: 16.9 % — ABNORMAL HIGH (ref 11.5–15.5)
WBC: 6 10*3/uL (ref 4.0–10.5)
nRBC: 0 % (ref 0.0–0.2)

## 2022-11-28 LAB — BASIC METABOLIC PANEL
Anion gap: 10 (ref 5–15)
BUN: 55 mg/dL — ABNORMAL HIGH (ref 8–23)
CO2: 22 mmol/L (ref 22–32)
Calcium: 8.5 mg/dL — ABNORMAL LOW (ref 8.9–10.3)
Chloride: 111 mmol/L (ref 98–111)
Creatinine, Ser: 4.19 mg/dL — ABNORMAL HIGH (ref 0.61–1.24)
GFR, Estimated: 13 mL/min — ABNORMAL LOW (ref >60)
Glucose, Bld: 109 mg/dL — ABNORMAL HIGH (ref 70–99)
Potassium: 3.5 mmol/L (ref 3.5–5.1)
Sodium: 143 mmol/L (ref 135–145)

## 2022-11-28 LAB — PROTIME-INR
INR: 1.7 — ABNORMAL HIGH (ref 0.8–1.2)
Prothrombin Time: 19.6 seconds — ABNORMAL HIGH (ref 11.4–15.2)

## 2022-11-28 LAB — APTT: aPTT: 37 seconds — ABNORMAL HIGH (ref 24–36)

## 2022-11-28 LAB — POC OCCULT BLOOD, ED: Fecal Occult Bld: POSITIVE — AB

## 2022-11-28 LAB — BRAIN NATRIURETIC PEPTIDE: B Natriuretic Peptide: 1925.6 pg/mL — ABNORMAL HIGH (ref 0.0–100.0)

## 2022-11-28 MED ORDER — VITAMIN B-12 1000 MCG PO TABS: 1000.0000 ug | ORAL_TABLET | Freq: Every day | ORAL | Status: DC

## 2022-11-28 MED ORDER — PANTOPRAZOLE SODIUM 20 MG PO TBEC: 20.0000 mg | DELAYED_RELEASE_TABLET | Freq: Every day | ORAL | Status: DC

## 2022-11-28 MED ORDER — VITAMIN B-12 1000 MCG PO TABS
1000.0000 ug | ORAL_TABLET | Freq: Every day | ORAL | Status: DC
  Administered 2022-11-29 – 2022-12-04 (×6): 1000 ug via ORAL
  Filled 2022-11-28 (×6): qty 1

## 2022-11-28 MED ORDER — ATORVASTATIN CALCIUM 10 MG PO TABS: 20.0000 mg | ORAL_TABLET | Freq: Every day | ORAL | Status: DC

## 2022-11-28 MED ORDER — HYDRALAZINE HCL 25 MG PO TABS
37.5000 mg | ORAL_TABLET | Freq: Three times a day (TID) | ORAL | Status: DC
  Administered 2022-11-28 – 2022-12-04 (×16): 37.5 mg via ORAL
  Filled 2022-11-28 (×16): qty 2

## 2022-11-28 MED ORDER — ISOSORBIDE DINITRATE 10 MG PO TABS
20.0000 mg | ORAL_TABLET | Freq: Three times a day (TID) | ORAL | Status: DC
  Administered 2022-11-28 – 2022-12-04 (×17): 20 mg via ORAL
  Filled 2022-11-28 (×17): qty 2

## 2022-11-28 MED ORDER — AMIODARONE HCL 200 MG PO TABS: 200.0000 mg | ORAL_TABLET | Freq: Every day | ORAL | Status: DC

## 2022-11-28 MED ORDER — CARVEDILOL 6.25 MG PO TABS
6.2500 mg | ORAL_TABLET | Freq: Two times a day (BID) | ORAL | Status: DC
  Administered 2022-11-28 – 2022-12-04 (×12): 6.25 mg via ORAL
  Filled 2022-11-28 (×12): qty 1

## 2022-11-28 MED ORDER — POTASSIUM CHLORIDE CRYS ER 20 MEQ PO TBCR
40.0000 meq | EXTENDED_RELEASE_TABLET | Freq: Once | ORAL | Status: AC
  Administered 2022-11-28: 40 meq via ORAL
  Filled 2022-11-28: qty 2

## 2022-11-28 MED ORDER — ATORVASTATIN CALCIUM 10 MG PO TABS
20.0000 mg | ORAL_TABLET | Freq: Every day | ORAL | Status: DC
  Administered 2022-11-29 – 2022-12-04 (×6): 20 mg via ORAL
  Filled 2022-11-28 (×6): qty 2

## 2022-11-28 MED ORDER — FUROSEMIDE 10 MG/ML IJ SOLN
60.0000 mg | Freq: Once | INTRAMUSCULAR | Status: AC
  Administered 2022-11-28: 60 mg via INTRAVENOUS
  Filled 2022-11-28: qty 6

## 2022-11-28 MED ORDER — AMIODARONE HCL 200 MG PO TABS
200.0000 mg | ORAL_TABLET | Freq: Every day | ORAL | Status: DC
  Administered 2022-11-29 – 2022-12-04 (×6): 200 mg via ORAL
  Filled 2022-11-28 (×6): qty 1

## 2022-11-28 MED ORDER — ACETAMINOPHEN 650 MG RE SUPP: 650.0000 mg | Freq: Four times a day (QID) | RECTAL | Status: DC | PRN

## 2022-11-28 MED ORDER — ACETAMINOPHEN 325 MG PO TABS: 650.0000 mg | ORAL_TABLET | Freq: Four times a day (QID) | ORAL | Status: DC | PRN

## 2022-11-28 MED ORDER — PANTOPRAZOLE SODIUM 40 MG IV SOLR
40.0000 mg | Freq: Two times a day (BID) | INTRAVENOUS | Status: DC
  Administered 2022-11-28 – 2022-11-30 (×4): 40 mg via INTRAVENOUS
  Filled 2022-11-28 (×4): qty 10

## 2022-11-28 MED ORDER — PANTOPRAZOLE SODIUM 40 MG PO TBEC: 40.0000 mg | DELAYED_RELEASE_TABLET | Freq: Two times a day (BID) | ORAL | Status: DC

## 2022-11-28 NOTE — H&P (Cosign Needed Addendum)
Date: 11/28/2022               Patient Name:  Steven Rosario MRN: [id number]  DOB: [birth year] Age / Sex: 89 y.o., male   PCP: Angelina Sheriff, MD         Medical Service: Internal Medicine Teaching Service         Attending Physician: Dr. Lottie Mussel, MD    First Contact: [place] Nickel, MD      Pager: [phone]      Second Contact: Madelyn Flavors, MD      Pager: Sandrea Hammond [phone]           After Hours (After 5p/  First Contact Pager: [phone]  weekends / holidays): Second Contact Pager: [phone]   SUBJECTIVE   Chief Complaint: breath shortness   History of Present Illness:  Steven Rosario is a 89 year old male with a past medical history of hypertension, hyperlipidemia, pernicious anemia, CKD IV, CAD, and arrhythmia post pacemaker 2018 who presents with breath shortness and fatigue.  Patient was recently admitted to IMTS on 1-31 for acute heart failure exacerbation. Discharged on 2-2 with home furosemide, carvedilol, hydralazine, isosorbide-dinitrate, and amlodipine to which he reports full adherence. He felt great immediately after discharge, but started to experience breath shortness about one week ago. Since that time, his breath shortness has gradually worsened. Also reports cough that started about four days ago and resolved yesterday without recurrence. Denies fever, chills, rhinorrhea, orthopnea, dizziness, lightheadedness, nausea, vomiting, bowel changes, and abdominal or chest pain. He has some leg swelling at baseline, but this has stayed about the same over the last few weeks.  History also notable for straining during defecation and intermittent bright-red spotting on toilet paper. He describes his stool last night as very dark.     ED Course: Upon arrival to the ED, vitals were notable for HR 59. Laboratory testing demonstrated Gluc 109, BUN 55, Cr 4.19, GFR 13, Hgb 8.6, and FOBT positivity. Chest radiograph demonstrates small-moderate right pleural effusion. One  dose of furosemide and potassium chloride were administered in the ED.    Meds:  Current Meds  Medication Sig   acetaminophen (TYLENOL) 650 MG CR tablet Take 1,300 mg by mouth every evening.   amiodarone (PACERONE) 200 MG tablet Take 1 tablet (200 mg total) by mouth daily.   amLODipine (NORVASC) 5 MG tablet Take 10 mg by mouth daily.   apixaban (ELIQUIS) 2.5 MG TABS tablet Take 2.5 mg by mouth 2 (two) times daily.   aspirin 81 MG EC tablet Take 81 mg by mouth daily.   atorvastatin (LIPITOR) 20 MG tablet Take 20 mg by mouth daily.   carvedilol (COREG) 12.5 MG tablet Take 1 tablet (12.5 mg total) by mouth 2 (two) times daily.   cyanocobalamin (VITAMIN B12) 1000 MCG tablet Take 1,000 mcg by mouth daily.   FIBER PO Take 1 tablet by mouth daily.   furosemide (LASIX) 40 MG tablet Take 2 tablets (80 mg total) by mouth daily.   hydrALAZINE (APRESOLINE) 25 MG tablet Take 1.5 tablets (37.5 mg total) by mouth 3 (three) times daily.   isosorbide dinitrate (ISORDIL) 20 MG tablet Take 1 tablet (20 mg total) by mouth 3 (three) times daily.   Multiple Vitamins-Minerals (CENTRUM SILVER PO) Take by mouth.   Multiple Vitamins-Minerals (PRESERVISION AREDS 2) CAPS Take 1 capsule by mouth 3 (three) times daily.   pantoprazole (PROTONIX) 20 MG tablet Take 20 mg by mouth daily.   potassium chloride (MICRO-K) 10 MEQ CR  capsule Take 10 mEq by mouth daily.   VITAMIN D, CHOLECALCIFEROL, PO Take 500 mcg by mouth daily.    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    OBJECTIVE:   Physical Exam: Blood pressure 131/81, pulse (!) 109, temperature 98 F (36.7 C), temperature source Oral, resp. rate (!) 25, height '5\' 8"'$  (1.727 m), weight 83 kg, SpO2 99 %.   General:      awake and alert, lying comfortably in bed, cooperative and pleasant, not in acute distress Skin:       warm and dry, intact without any obvious lesions or scars, no rashes Eyes:      extraocular movements intact, conjunctivae pink, pupils round, no periorbital swelling or scleral icterus Lungs:      normal respiratory effort, breathing unlabored, symmetrical chest rise, mild course breath sounds in right fields, no crackles or wheezing Cardiac:      regular rate and rhythm, normal S1 and S2, 2+ pitting edema above knees bilaterally  Abdomen:      soft and non-distended, normoactive bowel sounds present in all four quadrants, no tenderness to palpation or guarding Rectal:      small nodule appreciated at 270 degrees per digital inspection Neurologic:      oriented to person-place-time, moving all extremities, no gross focal deficits Psychiatric:      euthymic mood with congruent affect, tearful when recalling memories of deceased family members, intelligible speech   Labs: CBC    Component Value Date/Time   WBC 6.0  11/28/2022 1105   RBC 3.33 (L) 11/28/2022 1105   HGB 8.6 (L) 11/28/2022 1105   HGB 11.8 (L) 04/17/2022 1151   HCT 27.5 (L) 11/28/2022 1105   HCT 36.6 (L) 04/17/2022 1151   PLT 207 11/28/2022 1105   PLT 226 04/17/2022 1151   MCV 82.6 11/28/2022 1105   MCV 94 04/17/2022 1151   MCH 25.8 (L) 11/28/2022 1105   MCHC 31.3 11/28/2022 1105   RDW 16.9 (H) 11/28/2022 1105   RDW 14.8 04/17/2022 1151   LYMPHSABS 1.3 04/03/2022 0253   MONOABS 0.6 04/03/2022 0253   EOSABS 0.2 04/03/2022  0253   BASOSABS 0.1 04/03/2022 0253     CMP     Component Value Date/Time   NA 143 11/28/2022 1105   NA 144 07/11/2022 1145   K 3.5 11/28/2022 1105   CL 111 11/28/2022 1105   CO2 22 11/28/2022 1105   GLUCOSE 109 (H) 11/28/2022 1105   BUN 55 (H) 11/28/2022 1105   BUN 43 (H) 07/11/2022 1145   CREATININE 4.19 (H) 11/28/2022 1105   CALCIUM 8.5 (L) 11/28/2022 1105   PROT 6.3 07/11/2022 1145   ALBUMIN 3.9 07/11/2022 1145   AST 21 07/11/2022 1145   ALT 16 07/11/2022 1145   ALKPHOS 89 07/11/2022 1145   BILITOT 0.4 07/11/2022 1145   GFRNONAA 13 (L) 11/28/2022 1105     Imaging:  DG Chest 2 View Result Date: 11/28/2022 IMPRESSION: Small to moderate right pleural effusion.    ECG: My personal interpretation is ventricular-paced rhythm, which is unchanged from prior ECG on 10-31-2022    ASSESSMENT & PLAN:   Assessment & Plan by Problem: Principal Problem:   Acute combined systolic and diastolic heart failure [facility])   Steven Winkelman is a 89 year old male with a past medical history of hypertension, hyperlipidemia, pernicious anemia, CKD IV, CAD, CHF, and arrhythmia post pacemaker 2018 who presented with breath shortness and fatigue, now admitted for acute on chronic heart failure exacerbation.    ---Acute heart failure reduced ejection fraction exacerbation ---Chronic right-sided small-moderate pleural effusion Patient has history of heart failure reduced ejection fraction. Most recent echocardiogram  performed 2-1 demonstrated EF 30-35, grade II diastolic dysfunction, and mild LA-RA dilation. Recently admitted in 10-2022 for acute exacerbation, discharged with home medications including furosemide and carvedilol. Estimated dry weight is 80kg. He presented on 2-28 with breath shortness and fatigue that started about two weeks after prior discharge. Upon arrival, patient was hemodynamically stable and without supplemental oxygen. Laboratory tests revealed elevated creatinine and BNP. Exam demonstrated evidence of fluid overload. Chest radiograph notable for persistence of chronic right-sided small-moderate pleural effusion. Presentation is consistent with acute heart failure exacerbation. Given comorbid chronic kidney disease, etiology likely cardiorenal syndrome. Intravenous furosemide was initiated on day of admission. > Furosemide '80mg'$  IV x1 if net urine output less than 1L > Carvedilol 6.'225mg'$  q12 > Isosorbide dinitrate '20mg'$  q8 > Hydralazine 37.'5mg'$  q8 > Diet heart healthy > Monitor ins-outs > Trend BMP q24 > Physical-occupational therapy evaluation   ---Acute kidney injury ---Chronic kidney disease IV Patient has history of chronic kidney disease with recent baseline creatinine around 3.0-3.3. He currently follows with [facility] as outpatient. Upon arrival, creatinine elevated above baseline to 4.19. Exam notable for evidence of volume overload. Given concurrent heart failure, etiology likely prerenal secondary to cardiorenal syndrome. Diuresis with intravenous furosemide has been started. Primary team is closely monitoring electrolytes and renal function, will consider nephrology consult if these worsen. > Furosemide '80mg'$  IV x1 if net urine output less than 1L > Trend BMP q24   ---Normocytic anemia ---Positive fecal occult blood test ---History of hemorrhoids Patient has history of chronic anemia likely secondary to CKD IV. He reports intermittent red-colored toilet paper and some  straining with defecation. History also notable for possible melena on morning of admission. Upon arrival, FOBT positive and hemoglobin was 8.6 below previous baseline around 10. Rectal exam demonstrated small nodule at 270 degrees, negative for overt bleeding. History and presentation most consistent with hemorrhoids. Close monitoring is warranted to rule out acute bleed especially given apixaban use. Intravenous pantoprazole started. Primary team  will consider gastroenterology consult if hemoglobin worsens or evidence of active bleed develops.  > Pantoprazole '40mg'$  q12 IV > Trend CBC q24   ---Arrhythmia post pacemaker 2018 Patient has history of arrhythmia, atrioventricular pacemaker was placed in 2018. Currently takes amiodarone, aspirin, and apixaban. Upon arrival, electrocardiogram demonstrated sinus ventricular-paced rhythm unchanged from prior study. > Amiodarone '200mg'$  q24 > Hold apixaban given possible gastrointestinal bleed > Hold aspirin given possible gastrointestinal bleed   ---Coronary artery disease post stent ---Hyperlipidemia Patient has history of coronary artery disease post two LAD stents placed in 2012. He takes aspirin and atorvastatin at home. > Atorvastatin '20mg'$  q24 > Hold aspirin given possible gastrointestinal bleed   ---Hypertension Patient has history of hypertension managed at home with amlodipine. He also takes carvedilol, isosorbide-dinitrate, hydralazine, and furosemide. > Furosemide '80mg'$  IV x1 if net urine output less than 1L > Carvedilol 6.'225mg'$  q12 > Isosorbide dinitrate '20mg'$  q8 > Hydralazine 37.'5mg'$  q8 > Hold amlodipine given possible gastrointestinal bleed   ---Gastroesophageal reflux disease Patient has a history of gastroesophageal reflux disease managed with pantoprazole at home. Intravenous pantoprazole started at higher dose during current hospitalization given concern for possible gastrointestinal bleed. > Pantoprazole '40mg'$  q12 IV   ---Vitamin  B12 and D deficiency Patient has history of vitamin B12 and D deficiency managed at home with respective supplements. > Cyanocobalamin 1000ug q24    Diet: Heart Healthy VTE: SCDs IVF: None,None Code:  Discussed at bedside, patient is DNR but would like temporary medical interventions , prefers to avoid long-term life support including mechanical ventilation  Prior to Admission Living Arrangement: Home, living alone Anticipated Discharge Location: Home Barriers to Discharge: medical management  Dispo: Admit patient to Inpatient with expected length of stay greater than 2 midnights.  Signed: Serita Butcher, MD Internal Medicine Resident PGY-1  11/28/2022, 5:37 PM

## 2022-11-28 NOTE — ED Provider Notes (Signed)
[facility] Provider Note   CSN: [id number] Arrival date & time: 11/28/22  1040     History  Chief Complaint  Patient presents with   Shortness of Breath   Weakness    Steven Rosario is a 89 y.o. male.  HPI Presents with his son who assists with the history.  History is notable for CKD, CHF, Eliquis use.  Patient was hospitalized earlier this month for heart failure exacerbation in the context of worsening renal function.  He now presents with dyspnea, worse with supine positioning and activity.  There is associated fatigue.  No focal pain, no fall, no syncope.    Home Medications Prior to Admission medications   Medication Sig Start Date End Date Taking? Authorizing Provider  acetaminophen (TYLENOL) 650 MG CR tablet Take 1,300 mg by mouth every evening.    [provider]  amiodarone (PACERONE) 200 MG tablet Take 1 tablet (200 mg total) by mouth daily. 07/18/22   Camnitz, Will Hassell Done, MD  amLODipine (NORVASC) 5 MG tablet Take 10 mg by mouth daily. 03/29/22   [provider]  apixaban (ELIQUIS) 2.5 MG TABS tablet Take 2.5 mg by mouth 2 (two) times daily.    [provider]  aspirin 81 MG EC tablet Take 81 mg by mouth daily.    [provider]  atorvastatin (LIPITOR) 20 MG tablet Take 20 mg by mouth daily.    [provider]  carvedilol (COREG) 12.5 MG tablet Take 1 tablet (12.5 mg total) by mouth 2 (two) times daily. 07/17/21   Camnitz, Ocie Doyne, MD  cyanocobalamin (VITAMIN B12) 1000 MCG tablet Take 1,000 mcg by mouth daily.    [provider]  FIBER PO Take 1 tablet by mouth daily.    [provider]  furosemide (LASIX) 40 MG tablet Take 2 tablets (80 mg total) by mouth daily. 05/30/22   Camnitz, Ocie Doyne, MD  hydrALAZINE (APRESOLINE) 25 MG tablet Take 1.5 tablets (37.5 mg total) by mouth 3 (three) times daily. 05/30/22   Camnitz, Ocie Doyne, MD  isosorbide dinitrate  (ISORDIL) 20 MG tablet Take 1 tablet (20 mg total) by mouth 3 (three) times daily. 05/30/22   Camnitz, Ocie Doyne, MD  Multiple Vitamins-Minerals (CENTRUM SILVER PO) Take by mouth.    [provider]  Multiple Vitamins-Minerals (PRESERVISION AREDS 2) CAPS Take 1 capsule by mouth 3 (three) times daily.    [provider]  pantoprazole (PROTONIX) 20 MG tablet Take 20 mg by mouth daily.    [provider]  VITAMIN D, CHOLECALCIFEROL, PO Take 500 mcg by mouth daily.    [provider]      Allergies    Patient has no known allergies.    Review of Systems   Review of Systems  All other systems reviewed and are negative.   Physical Exam Updated Vital Signs BP 131/81 (BP Location: Right Arm)   Pulse (!) 109   Temp 98 F (36.7 C) (Oral)   Resp (!) 25   Ht '5\' 8"'$  (1.727 m)   Wt 83 kg   SpO2 99%   BMI 27.83 kg/m  Physical Exam Vitals and nursing note reviewed. Exam conducted with a chaperone present.  Constitutional:      General: He is not in acute distress.    Appearance: He is well-developed.  HENT:     Head: Normocephalic and atraumatic.  Eyes:     Conjunctiva/sclera: Conjunctivae normal.  Cardiovascular:  Rate and Rhythm: Normal rate and regular rhythm.  Pulmonary:     Effort: Pulmonary effort is normal. No respiratory distress.     Breath sounds: No stridor. Decreased breath sounds present.  Abdominal:     General: There is no distension.  Genitourinary:    Rectum: Guaiac result positive.  Musculoskeletal:     Right lower leg: Edema present.     Left lower leg: Edema present.  Skin:    General: Skin is warm and dry.  Neurological:     Mental Status: He is alert and oriented to person, place, and time.     ED Results / Procedures / Treatments   Labs (all labs ordered are listed, but only abnormal results are displayed) Labs Reviewed  BASIC METABOLIC PANEL - Abnormal; Notable for the following components:      Result Value    Glucose, Bld 109 (*)    BUN 55 (*)    Creatinine, Ser 4.19 (*)    Calcium 8.5 (*)    GFR, Estimated 13 (*)    All other components within normal limits  CBC - Abnormal; Notable for the following components:   RBC 3.33 (*)    Hemoglobin 8.6 (*)    HCT 27.5 (*)    MCH 25.8 (*)    RDW 16.9 (*)    All other components within normal limits  POC OCCULT BLOOD, ED - Abnormal; Notable for the following components:   Fecal Occult Bld POSITIVE (*)    All other components within normal limits  BRAIN NATRIURETIC PEPTIDE    EKG EKG Interpretation  Date/Time:  Wednesday November 28 2022 10:44:58 EST Ventricular Rate:  109 PR Interval:    QRS Duration: 166 QT Interval:  442 QTC Calculation: 595 R Axis:   -75 Text Interpretation: Ventricular-paced rhythm Abnormal ECG Confirmed by Carmin Muskrat [phone]) on 11/28/2022 1:50:07 PM  Radiology DG Chest 2 View  Result Date: 11/28/2022 CLINICAL DATA:  Shortness of breath.  Worse with lying down. EXAM: CHEST - 2 VIEW COMPARISON:  10/31/2022. FINDINGS: 1117 hours. Left chest dual-chamber pacemaker with leads projecting over the right atrium and ventricle. Small to moderate right pleural effusion with adjacent right basilar atelectasis. Stable cardiac and mediastinal contours. No pneumothorax. IMPRESSION: Small to moderate right pleural effusion. Electronically Signed   By: Emmit Alexanders M.D.   On: 11/28/2022 11:31    Procedures Procedures    Medications Ordered in ED Medications  furosemide (LASIX) injection 60 mg (has no administration in time range)    ED Course/ Medical Decision Making/ A&P                             Medical Decision Making Adult male with a history of heart failure, CKD presents with concern for dyspnea, fatigue.  Differential includes worsening renal function, cardiac function, infection, electrolyte abnormalities, anemia, GI bleed.  Labs from triage augmented with additional.  On cardiac monitor patient has  ventricular paced rhythm, intermittently tachycardic.  Pulse ox 99% room air normal   Amount and/or Complexity of Data Reviewed Independent Historian: caregiver External Data Reviewed: notes.    Details: Discharge summary from earlier this month reviewed, admission with IV Lasix Labs: ordered. Decision-making details documented in ED Course. Radiology: ordered and independent interpretation performed. Decision-making details documented in ED Course. ECG/medicine tests: ordered and independent interpretation performed. Decision-making details documented in ED Course.  Risk Prescription drug management. Decision regarding hospitalization.   ECHO  last hospitalization: 1. Left ventricular ejection fraction, by estimation, is 30 to 35%. The  left ventricle has moderately decreased function. The left ventricle  demonstrates regional wall motion abnormalities (see scoring  diagram/findings for description). Left ventricular   diastolic parameters are consistent with Grade II diastolic dysfunction  (pseudonormalization). There is akinesis of the left ventricular,  mid-apical inferior wall, apical segment and anterolateral wall. There is  aneurysmal dilation of the LV apex.   2. Right ventricular systolic function is moderately reduced. The right  ventricular size is normal. There is normal pulmonary artery systolic  pressure.   3. Left atrial size was mildly dilated.   4. Right atrial size was mildly dilated.   5. The mitral valve is normal in structure. Mild mitral valve  regurgitation. No evidence of mitral stenosis.   6. The aortic valve is tricuspid. There is moderate calcification of the  aortic valve. Aortic valve regurgitation is trivial. Aortic valve  sclerosis/calcification is present, without any evidence of aortic  stenosis.   7. Aortic dilatation noted. There is mild dilatation of the ascending  aorta, measuring 40 mm.   8. The inferior vena cava is normal in size with  greater than 50%  respiratory variability, suggesting right atrial pressure of 3 mmHg.  Update: Hemoccult positive.   2:29 PM Patient in no distress, awake, alert.  I reviewed findings, concerning for heart failure exacerbation with pleural effusion on x-ray, worsening renal function complicating his presentation, patient will receive IV Lasix, will require admission.  Notably, the patient was found to have Hemoccult positive stool, likely contributing to his fatigue.  No hypotension, no persistent tachycardia, no evidence for decompensation.  Patient is on Eliquis and this is likely also contributing to his bleed. Patient will be admitted to the internal medicine teaching service for further monitoring, management, continue diuresis.        Final Clinical Impression(s) / ED Diagnoses Final diagnoses:  Acute on chronic systolic congestive heart failure ([place])  Renal dysfunction  Occult GI bleeding      Carmin Muskrat, MD 11/28/22 1432

## 2022-11-28 NOTE — ED Notes (Signed)
ED TO INPATIENT HANDOFF REPORT  ED Nurse Name and Phone #: Kandee Keen [phone]  S Name/Age/Gender Steven Rosario 89 y.o. male Room/Bed: 018C/018C  Code Status   Code Status: DNR  Home/SNF/Other Home Patient oriented to: self, place, time, and situation Is this baseline? Yes   Triage Complete: Triage complete  Chief Complaint Acute combined systolic and diastolic heart failure ([place]) [I50.41]  Triage Note Pt. Stated, Steven Rosario had hard of breathing for4 days and I have CHF and weakness and it keeps getting worse.   Allergies No Known Allergies  Level of Care/Admitting Diagnosis ED Disposition     ED Disposition  Admit   Condition  --   Comment  Hospital Area: [facility] [[id number]]  Level of Care: Progressive [102]  Admit to Progressive based on following criteria: CARDIOVASCULAR & THORACIC of moderate stability with acute coronary syndrome symptoms/low risk myocardial infarction/hypertensive urgency/arrhythmias/heart failure potentially compromising stability and stable post cardiovascular intervention patients.  Admit to Progressive based on following criteria: NEPHROLOGY stable condition requiring close monitoring for AKI, requiring Hemodialysis or Peritoneal Dialysis either from expected electrolyte imbalance, acidosis, or fluid overload that can be managed by NIPPV or high flow oxygen.  May admit patient to [facility] or [facility] if equivalent level of care is available:: No  Covid Evaluation: Asymptomatic - no recent exposure (last 10 days) testing not required  Diagnosis: Acute combined systolic and diastolic heart failure ([place]) [428.41.ICD-9-CM]  Admitting Physician: Lottie Mussel [id number]  Attending Physician: Lottie Mussel [id number]  Certification:: I certify this patient will need inpatient services for at least 2 midnights  Estimated Length of Stay: 3          B Medical/Surgery History Past Medical History:  Diagnosis Date   Acute kidney  injury (AKI) with acute tubular necrosis (ATN) ([place])    Cervical disc disease    Chronic kidney disease    Coronary artery disease    Gout    Hypertension    Nephrolithiasis    Pneumonia due to COVID-19 virus    Presence of stent in LAD coronary artery    Prostate cancer ([place])    S/P placement of cardiac pacemaker    Urinary incontinence    Past Surgical History:  Procedure Laterality Date   ANKLE FRACTURE SURGERY Right    cardiac stents x2     caudal epidural injection     CERVICAL DISCECTOMY     COLONOSCOPY     EXPLORATORY LAPAROTOMY     lumbar facet joint injection Bilateral    medical branch block Bilateral    PROSTATECTOMY     RADIOFREQUENCY ABLATION Bilateral    ROTATOR CUFF REPAIR Right    SACROILIAC JOINT ARTHRODESIS Right    SACROILIAC JOINT ARTHRODESIS Bilateral    sacroilliac joint inj     SPIROMETRY       A IV Location/Drains/Wounds Patient Lines/Drains/Airways Status     Active Line/Drains/Airways     Name Placement date Placement time Site Days   Peripheral IV 11/28/22 20 G Anterior;Right Forearm 11/28/22  1447  Forearm  less than 1   External Urinary Catheter 10/31/22  --  --  28            Intake/Output Last 24 hours No intake or output data in the 24 hours ending 11/28/22 1525  Labs/Imaging Results for orders placed or performed during the hospital encounter of 11/28/22 (from the past 48 hour(s))  Basic metabolic panel     Status: Abnormal  Collection Time: 11/28/22 11:05 AM  Result Value Ref Range   Sodium 143 135 - 145 mmol/L   Potassium 3.5 3.5 - 5.1 mmol/L   Chloride 111 98 - 111 mmol/L   CO2 22 22 - 32 mmol/L   Glucose, Bld 109 (H) 70 - 99 mg/dL    Comment: Glucose reference range applies only to samples taken after fasting for at least 8 hours.   BUN 55 (H) 8 - 23 mg/dL   Creatinine, Ser 4.19 (H) 0.61 - 1.24 mg/dL   Calcium 8.5 (L) 8.9 - 10.3 mg/dL   GFR, Estimated 13 (L) >60 mL/min    Comment: (NOTE) Calculated using the  CKD-EPI Creatinine Equation (2021)    Anion gap 10 5 - 15    Comment: Performed at [facility] [address] [place], [place] [address]  CBC     Status: Abnormal   Collection Time: 11/28/22 11:05 AM  Result Value Ref Range   WBC 6.0 4.0 - 10.5 K/uL   RBC 3.33 (L) 4.22 - 5.81 MIL/uL   Hemoglobin 8.6 (L) 13.0 - 17.0 g/dL   HCT 27.5 (L) 39.0 - 52.0 %   MCV 82.6 80.0 - 100.0 fL   MCH 25.8 (L) 26.0 - 34.0 pg   MCHC 31.3 30.0 - 36.0 g/dL   RDW 16.9 (H) 11.5 - 15.5 %   Platelets 207 150 - 400 K/uL   nRBC 0.0 0.0 - 0.2 %    Comment: Performed at [facility], [place] [address] [place], [place] [address]  POC occult blood, ED     Status: Abnormal   Collection Time: 11/28/22  2:04 PM  Result Value Ref Range   Fecal Occult Bld POSITIVE (A) NEGATIVE   DG Chest 2 View  Result Date: 11/28/2022 CLINICAL DATA:  Shortness of breath.  Worse with lying down. EXAM: CHEST - 2 VIEW COMPARISON:  10/31/2022. FINDINGS: 1117 hours. Left chest dual-chamber pacemaker with leads projecting over the right atrium and ventricle. Small to moderate right pleural effusion with adjacent right basilar atelectasis. Stable cardiac and mediastinal contours. No pneumothorax. IMPRESSION: Small to moderate right pleural effusion. Electronically Signed   By: Emmit Alexanders M.D.   On: 11/28/2022 11:31    Pending Labs Unresulted Labs (From admission, onward)     Start     Ordered   11/29/22 0500  Renal function panel  Tomorrow morning,   R        11/28/22 1522   11/29/22 0500  CBC  Tomorrow morning,   R        11/28/22 1522   11/28/22 1522  Protime-INR  Once,   R        11/28/22 1522   11/28/22 1522  APTT  Once,   R        11/28/22 1522   11/28/22 1350  Brain natriuretic peptide  Once,   URGENT        11/28/22 1349            Vitals/Pain Today's Vitals   11/28/22 1358 11/28/22 1445 11/28/22 1457 11/28/22 1515  BP: 131/81 131/72  136/68  Pulse: (!) 109 60  (!) 58  Resp: (!) '25 15  14  '$ Temp:    98.1 F (36.7 C)   TempSrc:   Oral   SpO2: 99% 99%  96%  Weight:      Height:      PainSc:        Isolation Precautions  No active isolations  Medications Medications  acetaminophen (TYLENOL) tablet 650 mg (has no administration in time range)    Or  acetaminophen (TYLENOL) suppository 650 mg (has no administration in time range)  potassium chloride SA (KLOR-CON M) CR tablet 40 mEq (has no administration in time range)  furosemide (LASIX) injection 60 mg (60 mg Intravenous Given 11/28/22 1448)    Mobility walks with device     Focused Assessments Cardiac Assessment Handoff:      Does the Patient currently have chest pain? No    R Recommendations: See Admitting Provider Note  Report given to:   Additional Notes: Pt AxOx4. Acute chf, Gi bleed, renal dysfunction

## 2022-11-28 NOTE — Progress Notes (Signed)
Talked to patient's son Piers, who stated that patient has had two GI surgeries due to bleeding while he was living in [place].  Marcille Blanco, RN

## 2022-11-28 NOTE — ED Triage Notes (Signed)
Pt. Stated, Steven Rosario had hard of breathing for4 days and I have CHF and weakness and it keeps getting worse.

## 2022-11-28 NOTE — Hospital Course (Addendum)
He feels good this morning. Was able to walk all around the hall and tolerated it well. We discussed in detail about lasix regimen.     Lasix Stop ASA HH PT/OT/RN Outpatient sleep study

## 2022-11-29 DIAGNOSIS — R195 Other fecal abnormalities: Secondary | ICD-10-CM | POA: Diagnosis not present

## 2022-11-29 DIAGNOSIS — D509 Iron deficiency anemia, unspecified: Secondary | ICD-10-CM

## 2022-11-29 DIAGNOSIS — I5021 Acute systolic (congestive) heart failure: Secondary | ICD-10-CM | POA: Diagnosis not present

## 2022-11-29 DIAGNOSIS — N289 Disorder of kidney and ureter, unspecified: Secondary | ICD-10-CM | POA: Diagnosis not present

## 2022-11-29 DIAGNOSIS — J90 Pleural effusion, not elsewhere classified: Secondary | ICD-10-CM

## 2022-11-29 LAB — RENAL FUNCTION PANEL
Albumin: 3 g/dL — ABNORMAL LOW (ref 3.5–5.0)
Anion gap: 11 (ref 5–15)
BUN: 52 mg/dL — ABNORMAL HIGH (ref 8–23)
CO2: 20 mmol/L — ABNORMAL LOW (ref 22–32)
Calcium: 8.1 mg/dL — ABNORMAL LOW (ref 8.9–10.3)
Chloride: 110 mmol/L (ref 98–111)
Creatinine, Ser: 4.32 mg/dL — ABNORMAL HIGH (ref 0.61–1.24)
GFR, Estimated: 12 mL/min — ABNORMAL LOW (ref >60)
Glucose, Bld: 131 mg/dL — ABNORMAL HIGH (ref 70–99)
Phosphorus: 4 mg/dL (ref 2.5–4.6)
Potassium: 3.9 mmol/L (ref 3.5–5.1)
Sodium: 141 mmol/L (ref 135–145)

## 2022-11-29 LAB — BASIC METABOLIC PANEL
Anion gap: 10 (ref 5–15)
BUN: 53 mg/dL — ABNORMAL HIGH (ref 8–23)
CO2: 24 mmol/L (ref 22–32)
Calcium: 8.5 mg/dL — ABNORMAL LOW (ref 8.9–10.3)
Chloride: 107 mmol/L (ref 98–111)
Creatinine, Ser: 4.32 mg/dL — ABNORMAL HIGH (ref 0.61–1.24)
GFR, Estimated: 12 mL/min — ABNORMAL LOW (ref >60)
Glucose, Bld: 147 mg/dL — ABNORMAL HIGH (ref 70–99)
Potassium: 3.9 mmol/L (ref 3.5–5.1)
Sodium: 141 mmol/L (ref 135–145)

## 2022-11-29 LAB — CBC
HCT: 25.8 % — ABNORMAL LOW (ref 39.0–52.0)
Hemoglobin: 7.7 g/dL — ABNORMAL LOW (ref 13.0–17.0)
MCH: 25.1 pg — ABNORMAL LOW (ref 26.0–34.0)
MCHC: 29.8 g/dL — ABNORMAL LOW (ref 30.0–36.0)
MCV: 84 fL (ref 80.0–100.0)
Platelets: 181 10*3/uL (ref 150–400)
RBC: 3.07 MIL/uL — ABNORMAL LOW (ref 4.22–5.81)
RDW: 17.1 % — ABNORMAL HIGH (ref 11.5–15.5)
WBC: 5.4 10*3/uL (ref 4.0–10.5)
nRBC: 0 % (ref 0.0–0.2)

## 2022-11-29 LAB — GLUCOSE, CAPILLARY: Glucose-Capillary: 91 mg/dL (ref 70–99)

## 2022-11-29 MED ORDER — POTASSIUM CHLORIDE CRYS ER 20 MEQ PO TBCR
40.0000 meq | EXTENDED_RELEASE_TABLET | Freq: Once | ORAL | Status: AC
  Administered 2022-11-29: 40 meq via ORAL
  Filled 2022-11-29: qty 2

## 2022-11-29 MED ORDER — POTASSIUM CHLORIDE 20 MEQ PO PACK: 40.0000 meq | PACK | Freq: Two times a day (BID) | ORAL | Status: DC

## 2022-11-29 MED ORDER — FUROSEMIDE 10 MG/ML IJ SOLN
40.0000 mg | Freq: Two times a day (BID) | INTRAMUSCULAR | Status: DC
  Filled 2022-11-29: qty 4

## 2022-11-29 MED ORDER — FUROSEMIDE 10 MG/ML IJ SOLN
60.0000 mg | Freq: Once | INTRAMUSCULAR | Status: AC
  Administered 2022-11-29: 60 mg via INTRAVENOUS
  Filled 2022-11-29: qty 6

## 2022-11-29 MED ORDER — SODIUM CHLORIDE 0.9 % IV SOLN
125.0000 mg | Freq: Every day | INTRAVENOUS | Status: AC
  Administered 2022-11-29 – 2022-12-01 (×3): 125 mg via INTRAVENOUS
  Filled 2022-11-29 (×3): qty 10

## 2022-11-29 NOTE — Progress Notes (Signed)
Subjective:  Steven Rosario was feeling good this afternoon. Expressed some concern about low hemoglobin levels and inquired about transfusion. Discussed plan to consult gastroenterology and monitor renal function closely.    Objective: Vitals over previous 24hr: Vitals:   11/29/22 0448 11/29/22 0800 11/29/22 0913 11/29/22 1217  BP: (!) 150/68 (!) 155/70  123/75  Pulse: 60 (!) 59 60 (!) 108  Resp:      Temp: 98.1 F (36.7 C) (!) 97.5 F (36.4 C)  97.6 F (36.4 C)  TempSrc: Oral Oral  Oral  SpO2: 95% 93%  99%  Weight:      Height:        General:                       awake and alert, lying comfortably in bed, cooperative and pleasant, not in acute distress Lungs:                          normal respiratory effort, breathing unlabored, symmetrical chest rise, mild course breath sounds in right fields, no crackles or wheezing Cardiac:                        regular rate and rhythm, normal S1 and S2, no pitting edema Abdomen:                     soft and non-distended, normoactive bowel sounds present in all four quadrants, no tenderness to palpation or guarding Neurologic:                   oriented to person-place-time, moving all extremities, no gross focal deficits Psychiatric:                   euthymic mood with congruent affect, intelligible speech    Assessment/Plan: Steven Rosario is a 89 year old male with a past medical history of hypertension, hyperlipidemia, pernicious anemia, CKD IV, CAD, CHF, and arrhythmia post pacemaker 2018 who presented with breath shortness and fatigue, now admitted for acute on chronic heart failure exacerbation.       ---Acute heart failure reduced ejection fraction exacerbation ---Chronic right-sided small-moderate pleural effusion Patient has history of heart failure reduced ejection fraction. Most recent echocardiogram performed 2-1 demonstrated EF 30-35, grade II diastolic dysfunction, and mild LA-RA dilation. Recently admitted in  10-2022 for acute exacerbation, discharged with home medications including furosemide and carvedilol. Estimated dry weight is 80kg. He presented on 2-28 with breath shortness and fatigue that started about two weeks after prior discharge. Upon arrival, patient was hemodynamically stable and without supplemental oxygen. Laboratory tests revealed elevated creatinine and BNP. Exam demonstrated evidence of fluid overload. Chest radiograph notable for persistence of chronic right small-moderate pleural effusion. Presentation is consistent with acute heart failure exacerbation, etiology likely cardiorenal syndrome. Two doses of intravenous furosemide '60mg'$  provided adequate diuresis and patient now appears euvolemic on exam. Net output since admission is 1.2L. > Carvedilol 6.'225mg'$  q12 > Isosorbide dinitrate '20mg'$  q8 > Hydralazine 37.'5mg'$  q8 > Diet heart healthy > Monitor ins-outs > Trend BMP q24     ---Normocytic iron-deficiency anemia ---Positive fecal occult blood test ---History of hemorrhoids Patient has history of chronic anemia likely secondary to CKD IV and iron deficiency. Recent outpatient study revealed ferritin 47, iron 29, and saturation 9. Reports intermittent red-colored toilet paper and some straining with defecation. History also notable for possible melena on  morning of admission. Upon arrival, FOBT positive and hemoglobin was 8.6 below previous baseline around 10. Hemoglobin dropped to 7.7 on hospitalization day two, raising concern for acute bleed. Intravenous pantoprazole started and ferric gluconate to correct calculated iron deficit of '708mg'$ . > Gastroenterology consult, appreciate recommendations > Ferric gluconate '125mg'$  x3 > Pantoprazole '40mg'$  q12 IV > Trend CBC q24   ---Acute kidney injury ---Chronic kidney disease IV Patient has history of chronic kidney disease with recent baseline creatinine around 3.0-3.3. He currently follows with [facility] as outpatient. Upon arrival,  creatinine elevated above baseline to 4.19. Exam notable for evidence of volume overload. Given concurrent heart failure, etiology likely prerenal secondary to cardiorenal syndrome. Diuresis with intravenous furosemide has been started. Primary team is closely monitoring electrolytes and renal function, will consider nephrology consult if these worsen. > Trend BMP q24     ---Arrhythmia post pacemaker 2018 Patient has history of arrhythmia, atrioventricular pacemaker was placed in 2018. Currently takes amiodarone, aspirin, and apixaban. Upon arrival, electrocardiogram demonstrated sinus ventricular-paced rhythm unchanged from prior study. > Amiodarone '200mg'$  q24 > Hold apixaban given possible gastrointestinal bleed > Hold aspirin given possible gastrointestinal bleed     ---Coronary artery disease post stent ---Hyperlipidemia Patient has history of coronary artery disease post two LAD stents placed in 2012. He takes aspirin and atorvastatin at home. > Atorvastatin '20mg'$  q24 > Hold aspirin given possible gastrointestinal bleed     ---Hypertension Patient has history of hypertension managed at home with amlodipine. He also takes carvedilol, isosorbide-dinitrate, hydralazine, and furosemide. > Furosemide '80mg'$  IV x1 if net urine output less than 1L > Carvedilol 6.'225mg'$  q12 > Isosorbide dinitrate '20mg'$  q8 > Hydralazine 37.'5mg'$  q8 > Hold amlodipine given possible gastrointestinal bleed     ---Gastroesophageal reflux disease Patient has a history of gastroesophageal reflux disease managed with pantoprazole at home. Intravenous pantoprazole started at higher dose during current hospitalization given concern for possible gastrointestinal bleed. > Pantoprazole '40mg'$  q12 IV     ---Vitamin B12 and D deficiency Patient has history of vitamin B12 and D deficiency managed at home with respective supplements. > Cyanocobalamin 1000ug q24    Principal Problem:   Acute combined systolic and diastolic  heart failure (HCC) Active Problems:   Occult GI bleeding   Renal dysfunction    Prior to Admission Living Arrangement: home living alone Anticipated Discharge Location: home Barriers to Discharge: medical management Dispo: Anticipated discharge in approximately 2-3 day(s).    Roswell Nickel, MD Internal Medicine PGY-1 Pager [phone]  After 5pm on weekdays and 1pm on weekends: On Call pager [phone]

## 2022-11-29 NOTE — Evaluation (Signed)
Physical Therapy Evaluation Patient Details Name: Geon Claude MRN: [id number] DOB: 06/09/1929 Today's Date: 11/29/2022  History of Present Illness  Pt is a 89 y/o M presenting to ED on 2/28 with SOB, admitted for acute combined systolic and diastolic heart failure. PMH includes HTN, HLD, CKD IV, CAD, arrhythmia s/p pacemaker 2018.  Clinical Impression  Pt present with diagnosis above. Pt tolerated treatment well and presents at or near baseline function. Pt was able to ambulate in hallway with RW and navigate stairs Mod I. Pt has no further acute PT needs and would benefit from mobility referral during acute stay. Pt has no post acute PT needs.       Recommendations for follow up therapy are one component of a multi-disciplinary discharge planning process, led by the attending physician.  Recommendations may be updated based on patient status, additional functional criteria and insurance authorization.  Follow Up Recommendations No PT follow up      Assistance Recommended at Discharge PRN  Patient can return home with the following  Other (comment) (At baseline function)    Equipment Recommendations None recommended by PT  Recommendations for Other Services       Functional Status Assessment Patient has had a recent decline in their functional status and demonstrates the ability to make significant improvements in function in a reasonable and predictable amount of time.     Precautions / Restrictions Precautions Precautions: Fall Restrictions Weight Bearing Restrictions: No Other Position/Activity Restrictions: +      Mobility  Bed Mobility Overal bed mobility: Needs Assistance Bed Mobility: Sidelying to Sit   Sidelying to sit: Min guard       General bed mobility comments: increased time, uses momentum to bring self from sidelying > sitting EOB    Transfers Overall transfer level: Needs assistance Equipment used: Rolling walker (2 wheels) Transfers: Sit to/from  Stand Sit to Stand: Supervision                Ambulation/Gait Ambulation/Gait assistance: Modified independent (Device/Increase time) Gait Distance (Feet): 600 Feet Assistive device: Rolling walker (2 wheels) Gait Pattern/deviations: WFL(Within Functional Limits)       General Gait Details: Pt presents at baseline mobility  Stairs Stairs: Yes Stairs assistance: Min guard Stair Management: Two rails Number of Stairs: 15 General stair comments: Standing rest break at top of stairs  Wheelchair Mobility    Modified Rankin (Stroke Patients Only)       Balance Overall balance assessment: Mild deficits observed, not formally tested                                           Pertinent Vitals/Pain Pain Assessment Pain Assessment: No/denies pain    Home Living Family/patient expects to be discharged to:: Private residence Living Arrangements: Alone Available Help at Discharge: Family;Friend(s);Available PRN/intermittently Type of Home: House Home Access: Stairs to enter Entrance Stairs-Rails: Can reach both Entrance Stairs-Number of Steps: 5   Home Layout: One level Home Equipment: Rollator (4 wheels);Grab bars - toilet;Grab bars - tub/shower;Shower seat - built Medical sales representative (2 wheels) Additional Comments: has life alert bracelet    Prior Function Prior Level of Function : Independent/Modified Independent;Driving             Mobility Comments: Rollator for in home, rollator for "out in the yard", hx of fall reports "over a year ago" ADLs Comments: independent, drives, does  household chores, ind with med mgmt, was active with HHOT prior to admission     Hand Dominance   Dominant Hand: Right    Extremity/Trunk Assessment   Upper Extremity Assessment Upper Extremity Assessment: Defer to OT evaluation    Lower Extremity Assessment Lower Extremity Assessment: Overall WFL for tasks assessed    Cervical / Trunk  Assessment Cervical / Trunk Assessment: Normal  Communication   Communication: HOH  Cognition Arousal/Alertness: Awake/alert Behavior During Therapy: WFL for tasks assessed/performed Overall Cognitive Status: Within Functional Limits for tasks assessed                                 General Comments: HOH vs cog, mild impulsivity, suspect baseline        General Comments General comments (skin integrity, edema, etc.): VSS on RA. Pt in NAD    Exercises     Assessment/Plan    PT Assessment Patient does not need any further PT services  PT Problem List         PT Treatment Interventions      PT Goals (Current goals can be found in the Care Plan section)       Frequency       Co-evaluation               AM-PAC PT "6 Clicks" Mobility  Outcome Measure Help needed turning from your back to your side while in a flat bed without using bedrails?: None Help needed moving from lying on your back to sitting on the side of a flat bed without using bedrails?: None Help needed moving to and from a bed to a chair (including a wheelchair)?: None Help needed standing up from a chair using your arms (e.g., wheelchair or bedside chair)?: None Help needed to walk in hospital room?: None Help needed climbing 3-5 steps with a railing? : A Little 6 Click Score: 23    End of Session Equipment Utilized During Treatment: Gait belt Activity Tolerance: Patient tolerated treatment well Patient left: in chair;with call bell/phone within reach;with chair alarm set Nurse Communication: Mobility status PT Visit Diagnosis: Other abnormalities of gait and mobility (R26.89)    Time: [id number] PT Time Calculation (min) (ACUTE ONLY): 25 min   Charges:   PT Evaluation $PT Eval Low Complexity: 1 Low PT Treatments $Gait Training: 8-22 mins        Shelby Mattocks, PT, DPT Acute Rehab Services [id number]   Viann Shove 11/29/2022, 11:25 AM

## 2022-11-29 NOTE — Evaluation (Signed)
Occupational Therapy Evaluation Patient Details Name: Steven Rosario MRN: [id number] DOB: May 02, 1929 Today's Date: 11/29/2022   History of Present Illness Pt is a 89 y/o M presenting to ED on 2/28 with SOB, admitted for acute combined systolic and diastolic heart failure. PMH includes HTN, HLD, CKD IV, CAD, arrhythmia s/p pacemaker 2018.   Clinical Impression   Pt lives alone, reports independence at baseline with ADLs/IADLs, pt drives and uses RW/rollator interchangeably for mobility. Pt needing min guard-min A for ADLs, min guard for bed mobility, and min guard for transfers with RW. VSS throughout session. Pt presenting with impairments listed below, will follow acutely. Pt reports actively working with [facility] prior to admission, recommend resuming HHOT at d/c.      Recommendations for follow up therapy are one component of a multi-disciplinary discharge planning process, led by the attending physician.  Recommendations may be updated based on patient status, additional functional criteria and insurance authorization.   Follow Up Recommendations  Home health OT     Assistance Recommended at Discharge Set up Supervision/Assistance  Patient can return home with the following A little help with bathing/dressing/bathroom;Assistance with cooking/housework;Assist for transportation;Help with stairs or ramp for entrance    Functional Status Assessment  Patient has had a recent decline in their functional status and demonstrates the ability to make significant improvements in function in a reasonable and predictable amount of time.  Equipment Recommendations  None recommended by OT (pt has all needed DME)    Recommendations for Other Services PT consult     Precautions / Restrictions Precautions Precautions: Fall Restrictions Weight Bearing Restrictions: No      Mobility Bed Mobility Overal bed mobility: Needs Assistance Bed Mobility: Sidelying to Sit   Sidelying to sit: Min guard        General bed mobility comments: increased time, uses momentum to bring self from sidelying > sitting EOB    Transfers Overall transfer level: Needs assistance Equipment used: Rolling walker (2 wheels) Transfers: Sit to/from Stand Sit to Stand: Min guard                  Balance Overall balance assessment: Mild deficits observed, not formally tested                                         ADL either performed or assessed with clinical judgement   ADL Overall ADL's : Needs assistance/impaired Eating/Feeding: NPO Eating/Feeding Details (indicate cue type and reason): pt asking for food, RN notified Grooming: Min guard   Upper Body Bathing: Min guard   Lower Body Bathing: Minimal assistance   Upper Body Dressing : Min guard   Lower Body Dressing: Minimal assistance   Toilet Transfer: Min guard;Ambulation;Rolling walker (2 wheels)   Toileting- Clothing Manipulation and Hygiene: Min guard       Functional mobility during ADLs: Min guard;Rolling walker (2 wheels)       Vision   Vision Assessment?: No apparent visual deficits     Perception Perception Perception Tested?: No   Praxis Praxis Praxis tested?: Not tested    Pertinent Vitals/Pain Pain Assessment Pain Assessment: No/denies pain     Hand Dominance Right   Extremity/Trunk Assessment Upper Extremity Assessment Upper Extremity Assessment: Generalized weakness (limited L shoulder mobility/AROM, PROM WFL, reports hx of rotator cuff injury)   Lower Extremity Assessment Lower Extremity Assessment: Defer to PT evaluation   Cervical /  Trunk Assessment Cervical / Trunk Assessment: Normal   Communication Communication Communication: HOH   Cognition Arousal/Alertness: Awake/alert Behavior During Therapy: WFL for tasks assessed/performed Overall Cognitive Status: Within Functional Limits for tasks assessed                                 General Comments: HOH  vs cog, mild impulsivity, suspect baseline     General Comments  VSS on RA    Exercises     Shoulder Instructions      Home Living Family/patient expects to be discharged to:: Private residence Living Arrangements: Alone Available Help at Discharge: Family;Friend(s);Available PRN/intermittently Type of Home: House Home Access: Stairs to enter [employer] of Steps: 5 Entrance Stairs-Rails: Can reach both Home Layout: One level     Bathroom Shower/Tub: Occupational psychologist: Standard Bathroom Accessibility: Yes   Home Equipment: Rollator (4 wheels);Grab bars - toilet;Grab bars - tub/shower;Shower seat - built Medical sales representative (2 wheels)   Additional Comments: has life alert bracelet      Prior Functioning/Environment Prior Level of Function : Independent/Modified Independent;Driving             Mobility Comments: Rollator for in home, rollator for "out in the yard", hx of fall reports "over a year ago" ADLs Comments: independent, drives, does household chores, ind with med mgmt, was active with [facility] prior to admission        OT Problem List: Decreased activity tolerance;Decreased safety awareness      OT Treatment/Interventions: Self-care/ADL training;Therapeutic exercise;Energy conservation;DME and/or AE instruction;Therapeutic activities;Patient/family education;Balance training    OT Goals(Current goals can be found in the care plan section) Acute Rehab OT Goals Patient Stated Goal: to eat OT Goal Formulation: With patient Time For Goal Achievement: 12/13/22 Potential to Achieve Goals: Good ADL Goals Pt Will Perform Lower Body Dressing: Independently;sit to/from stand;sitting/lateral leans Pt Will Perform Tub/Shower Transfer: Tub transfer;Shower transfer;Independently;ambulating;shower seat Additional ADL Goal #1: pt will verbalize 3 energy conservation strategies in prep for ADLs Additional ADL Goal #2: Pt will verbalize 3 strategies  for CHF management  OT Frequency: Min 2X/week    Co-evaluation              AM-PAC OT "6 Clicks" Daily Activity     Outcome Measure Help from another person eating meals?: None Help from another person taking care of personal grooming?: A Little Help from another person toileting, which includes using toliet, bedpan, or urinal?: A Little Help from another person bathing (including washing, rinsing, drying)?: A Little Help from another person to put on and taking off regular upper body clothing?: A Little Help from another person to put on and taking off regular lower body clothing?: A Little 6 Click Score: 19   End of Session Equipment Utilized During Treatment: Gait belt;Rolling walker (2 wheels) Nurse Communication: Mobility status  Activity Tolerance: Patient tolerated treatment well Patient left: in chair;with call bell/phone within reach;with chair alarm set;with nursing/sitter in room  OT Visit Diagnosis: Unsteadiness on feet (R26.81);Other abnormalities of gait and mobility (R26.89);Muscle weakness (generalized) (M62.81);History of falling (Z91.81)                Time: [id number] OT Time Calculation (min): 25 min Charges:  OT General Charges $OT Visit: 1 Visit OT Evaluation $OT Eval Moderate Complexity: 1 Mod OT Treatments $Self Care/Home Management : 8-22 mins  Renaye Rakers, OTD, OTR/L SecureChat Preferred Acute Rehab ([phone])  [place] 11/29/2022, 9:22 AM

## 2022-11-29 NOTE — Consult Note (Addendum)
Referring Provider: Dr. Lottie Mussel  Primary Care Physician:  Angelina Sheriff, MD Primary Gastroenterologist:  Althia Forts   Reason for Consultation: Anemia, positive FOBT  HPI: Steven Rosario is a 89 y.o. 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 Past small bowel resection, further details unknown.   He was admitted to the hospital 10/31/2022 secondary to shortness of breath secondary to acute on chronic systolic and diastolic heart failure. He was successfully diuresed after receiving Lasix. He developed a 6-minute episode of a wide-complex tachycardia during this hospitalization and he was seen by cardiology who felt he had an atrial arrhythmia without VT.  Overall, his clinical status stabilized and he was discharged home 11/02/2022 with instructions to continue Amiodarone, Coreg and Eliquis as previously prescribed.  He presented to the ED 11/28/2022 with progressive shortness of breath. Labs in the ED showed a WBC count of 6.0.  Hemoglobin 8.6, down from 10.3 on 11/02/2022).  Platelet 207.  BUN 55 up from 42.  Creatinine 4.19 up from 3.31.  Potassium 3.5.  NR 1.7. FOBT +. Chest xray showed a small to moderate right pleural effusion. A GI consult was requested to further evaluate his anemia and FOBT.   He denies having any dysphagia or heartburn.  He takes Pantoprazole 20 mg daily several years.  Never had an EGD.  No upper or lower abdominal pain.  No nausea or vomiting.  He takes Eliquis daily, last dose was yesterday morning around 9 AM.  He takes ASA 81 mg daily, no other NSAID use.  No alcohol use.  He endorses passing intermittent black formed stools over the past month.  Approximately 2 weeks ago, he was constipated and  manually disimpacted hard stool from the rectum and he noted a small specks of blood on the toilet tissue.  He started drinking prune juice daily and since then he has been passing a normal formed stool that is easy to pass but he does not always look at his stool before he flushes.  He has undergone 3-4 colonoscopies in his lifetime and he does not recall if he had any colon polyps.  No known family history of esophageal, gastric or colorectal cancer.  No family at the bedside.   Past Medical History:  Diagnosis Date   Acute kidney injury (AKI) with acute tubular necrosis (ATN) (HCC)    Cervical disc disease    Chronic kidney disease    Coronary artery disease    Gout    Hypertension    Nephrolithiasis    Pneumonia due to COVID-19 virus    Presence of stent in LAD coronary artery    Prostate cancer (HCC)    S/P placement of cardiac pacemaker    Urinary incontinence     Past Surgical History:  Procedure Laterality Date   ANKLE FRACTURE SURGERY Right    cardiac stents x2     caudal epidural injection     CERVICAL DISCECTOMY     COLONOSCOPY     EXPLORATORY LAPAROTOMY     lumbar facet joint injection Bilateral    medical branch block Bilateral    PROSTATECTOMY     RADIOFREQUENCY ABLATION Bilateral    ROTATOR CUFF REPAIR Right    SACROILIAC JOINT ARTHRODESIS Right  SACROILIAC JOINT ARTHRODESIS Bilateral    sacroilliac joint inj     SPIROMETRY      Prior to Admission medications   Medication Sig Start Date End Date Taking? Authorizing Provider  acetaminophen (TYLENOL) 650 MG CR tablet Take 1,300 mg by mouth every evening.   Yes [provider]  amiodarone (PACERONE) 200 MG tablet Take 1 tablet (200 mg total) by mouth daily. 07/18/22  Yes Camnitz, Will Hassell Done, MD  amLODipine (NORVASC) 5 MG tablet Take 10 mg by mouth daily. 03/29/22  Yes [provider]  apixaban (ELIQUIS) 2.5 MG TABS tablet Take 2.5 mg by mouth 2 (two) times daily.   Yes [provider]  aspirin 81 MG EC tablet Take 81 mg by mouth daily.   Yes [provider]  atorvastatin (LIPITOR) 20 MG tablet Take 20 mg by mouth daily.   Yes [provider]  carvedilol (COREG) 12.5 MG tablet Take 1 tablet (12.5 mg total) by mouth 2 (two) times daily. 07/17/21  Yes Camnitz, Will Hassell Done, MD  cyanocobalamin (VITAMIN B12) 1000 MCG tablet Take 1,000 mcg by mouth daily.   Yes [provider]  FIBER PO Take 1 tablet by mouth daily.   Yes [provider]  furosemide (LASIX) 40 MG tablet Take 2 tablets (80 mg total) by mouth daily. 05/30/22  Yes Camnitz, Ocie Doyne, MD  hydrALAZINE (APRESOLINE) 25 MG tablet Take 1.5 tablets (37.5 mg total) by mouth 3 (three) times daily. 05/30/22  Yes Camnitz, Ocie Doyne, MD  isosorbide dinitrate (ISORDIL) 20 MG tablet Take 1 tablet (20 mg total) by mouth 3 (three) times daily. 05/30/22  Yes Camnitz, Will Hassell Done, MD  Multiple Vitamins-Minerals (CENTRUM SILVER PO) Take by mouth.   Yes [provider]  Multiple Vitamins-Minerals (PRESERVISION AREDS 2) CAPS Take 1 capsule by mouth 3 (three) times daily.   Yes [provider]  pantoprazole (PROTONIX) 20 MG tablet Take 20 mg by mouth daily.   Yes [provider]  potassium chloride (MICRO-K) 10 MEQ CR capsule Take 10 mEq by mouth daily. 11/08/22  Yes [provider]  VITAMIN D, CHOLECALCIFEROL, PO Take 500 mcg by mouth daily.   Yes [provider]    Current Facility-Administered Medications  Medication Dose Route Frequency Provider Last Rate Last Admin   acetaminophen (TYLENOL) tablet 650 mg  650 mg Oral Q6H PRN Virl Axe, MD       Or   acetaminophen (TYLENOL) suppository 650 mg  650 mg Rectal Q6H PRN Virl Axe, MD       amiodarone (PACERONE) tablet 200 mg  200 mg Oral Daily Virl Axe, MD   200 mg at 11/29/22 0914   atorvastatin (LIPITOR) tablet 20 mg  20 mg Oral Daily Virl Axe, MD   20 mg at 11/29/22 0914   carvedilol  (COREG) tablet 6.25 mg  6.25 mg Oral BID Virl Axe, MD   6.25 mg at 11/29/22 0913   cyanocobalamin (VITAMIN B12) tablet 1,000 mcg  1,000 mcg Oral Daily Virl Axe, MD   1,000 mcg at 11/29/22 [id number]   hydrALAZINE (APRESOLINE) tablet 37.5 mg  37.5 mg Oral TID Virl Axe, MD   37.5 mg at 11/29/22 0914   isosorbide dinitrate (ISORDIL) tablet 20 mg  20 mg Oral TID Virl Axe, MD   20 mg at 11/29/22 0913   pantoprazole (PROTONIX) injection 40 mg  40 mg Intravenous Q12H Virl Axe, MD   40 mg at 11/29/22 0914    Allergies as of  11/28/2022   (No Known Allergies)    Family History  Problem Relation Age of Onset   Diabetes Mother    Obesity Mother    Heart failure Mother    CAD Father    Bone cancer Father    Heart disease Sister    Obesity Brother    Lung cancer Son     Social History   Socioeconomic History   Marital status: Widowed    Spouse name: Not on file   Number of children: 4   Years of education: Not on file   Highest education level: 8th grade  Occupational History   Occupation: retired   Tobacco Use   Smoking status: Never   Smokeless tobacco: Never  Vaping Use   Vaping Use: Never used  Substance and Sexual Activity   Alcohol use: Not Currently    Comment: past   Drug use: Never   Sexual activity: Not on file  Other Topics Concern   Not on file  Social History Narrative   Not on file   Social Determinants of Health   Financial Resource Strain: Low Risk  (11/02/2022)   Overall Financial Resource Strain (CARDIA)    Difficulty of Paying Living Expenses: Not very hard  Food Insecurity: No Food Insecurity (11/28/2022)   Hunger Vital Sign    Worried About Running Out of Food in the Last Year: Never true    Ran Out of Food in the Last Year: Never true  Transportation Needs: No Transportation Needs (11/28/2022)   PRAPARE - Hydrologist (Medical): No    Lack of Transportation (Non-Medical): No  Physical Activity: Not on  file  Stress: Not on file  Social Connections: Not on file  Intimate Partner Violence: Not At Risk (11/28/2022)   Humiliation, Afraid, Rape, and Kick questionnaire    Fear of Current or Ex-Partner: No    Emotionally Abused: No    Physically Abused: No    Sexually Abused: No    Review of Systems: Gen: Denies fever, sweats or chills. No weight loss.  CV: Denies chest pain, palpitations or edema. Resp: Denies cough, shortness of breath of hemoptysis.  GI: See HPI. GU : Denies urinary burning, blood in urine, increased urinary frequency or incontinence. MS: Denies joint pain, muscles aches or weakness. Derm: Denies rash, itchiness, skin lesions or unhealing ulcers. Psych: Denies depression, anxiety, memory loss or confusion. Heme: + Bruises easily.  Neuro:  Denies headaches, dizziness or paresthesias. Endo:  Denies any problems with DM, thyroid or adrenal function.  Physical Exam: Vital signs in last 24 hours: Temp:  [97.5 F (36.4 C)-98.1 F (36.7 C)] 97.6 F (36.4 C) (02/29 1217) Pulse Rate:  [58-108] 108 (02/29 1217) Resp:  [14-18] 18 (02/28 1703) BP: (123-155)/(65-75) 123/75 (02/29 1217) SpO2:  [93 %-100 %] 99 % (02/29 1217) FiO2 (%):  [97 %] 97 % (02/28 1515) Weight:  [83.1 kg-83.2 kg] 83.1 kg (02/29 0012) Last BM Date : 11/27/22 General:  Alert 89 year old male hard of hearing in no acute distress. Head:  Normocephalic and atraumatic. Eyes:  No scleral icterus. Conjunctiva pink. Ears:  Normal auditory acuity. Nose:  No deformity, discharge or lesions. Mouth:  Dentition intact. No ulcers or lesions.  Neck:  Supple. No lymphadenopathy or thyromegaly.  Lungs: Breath sounds clear throughout. No wheezes, rhonchi or crackles.  Heart: Irregular rhythm, no murmurs. Abdomen:, Nondistended.  Nontender.  Positive bowel sounds to all 4 quadrants. Midline abdominal scar.  Rectal: No external  hemorrhoids or fissure.  No hemorrhoids without prolapse or bleeding.  Formed golden brown  stool in the rectal vault.  RN at the bedside at time of exam. Musculoskeletal:  Symmetrical without gross deformities.  Pulses:  Normal pulses noted. Extremities:  Without clubbing or edema. Neurologic:  Alert and  oriented x 4. No focal deficits.  Skin:  Intact without significant lesions or rashes. Psych:  Alert and cooperative. Normal mood and affect.  Intake/Output from previous day: 02/28 0701 - 02/29 0700 In: 600 [P.O.:600] Out: 2150 [Urine:2150] Intake/Output this shift: Total I/O In: 240 [P.O.:240] Out: 1400 [Urine:1400]  Lab Results: Recent Labs    11/28/22 1105 11/29/22 0042  WBC 6.0 5.4  HGB 8.6* 7.7*  HCT 27.5* 25.8*  PLT 207 181   BMET Recent Labs    11/28/22 1105 11/29/22 0042 11/29/22 1233  NA 143 141 141  K 3.5 3.9 3.9  CL 111 110 107  CO2 22 20* 24  GLUCOSE 109* 131* 147*  BUN 55* 52* 53*  CREATININE 4.19* 4.32* 4.32*  CALCIUM 8.5* 8.1* 8.5*   LFT Recent Labs    11/29/22 0042  ALBUMIN 3.0*   PT/INR Recent Labs    11/28/22 1620  LABPROT 19.6*  INR 1.7*   Hepatitis Panel No results for input(s): "HEPBSAG", "HCVAB", "HEPAIGM", "HEPBIGM" in the last 72 hours.    Studies/Results: DG Chest 2 View  Result Date: 11/28/2022 CLINICAL DATA:  Shortness of breath.  Worse with lying down. EXAM: CHEST - 2 VIEW COMPARISON:  10/31/2022. FINDINGS: 1117 hours. Left chest dual-chamber pacemaker with leads projecting over the right atrium and ventricle. Small to moderate right pleural effusion with adjacent right basilar atelectasis. Stable cardiac and mediastinal contours. No pneumothorax. IMPRESSION: Small to moderate right pleural effusion. Electronically Signed   By: Emmit Alexanders M.D.   On: 11/28/2022 11:31    IMPRESSION/PLAN:  89 year old male admitted to the hospital with recurrent shortness of breath and fatigue. Recently admitted to the hospital 1/31 - 11/01/2022 with acute on chronic systolic heart failure. LV EF 30 - 35% per ECHO 11/01/2022.  Chest x-ray 2/28 showed small to moderate right pleural effusion. -Management per the medical service and cardiology  Acute on chronic anemia. Hg 10.3 -> 8.6. + FOBT.  Intermittent black stools over the past month. Iron level not done. Received IV iron today. Never had an EGD. Last colonoscopy was 20+ years ago. Last dose of Eliquis was 9am on 2/28. -CBC in am -Transfuse for Hg < 8 -Continue Pantoprazole 40 mg IV every 12 hours -Heart healthy/renal diet  -Defer endoscopic recommendations to Dr. [name]. Patient willing to undergo any recommended endoscopic procedure that is medically necessary  History of pernicious anemia on oral B12 -Check B12 level in am  Constipation with few spots of red blood on toilet tissue after patient manually removed stool from the rectum -Miralax Q HS as tolerated  AKI on CKD. Cr 4.19 -> 4.32  History of atrial fibrillation on Eliquis, Amiodarone and Carvedilol. Last does of Eliquis was 9am on 2/28. -Continue to hold Eliquis  CAD s/p DES x 2 in 2012. S/P pacemaker placement 2018.   CHF with LV EF 30 - 35% per ECHO 11/01/2022    Patrecia Pour Kennedy-Smith  11/29/2022, 3:59PM

## 2022-11-29 NOTE — Progress Notes (Signed)
Mobility Specialist - Progress Note   11/29/22 1400  Mobility  Activity Ambulated with assistance in hallway  Level of Assistance Standby assist, set-up cues, supervision of patient - no hands on  Assistive Device Front wheel walker  Distance Ambulated (ft) 600 ft  Activity Response Tolerated well  Mobility Referral Yes  $Mobility charge 1 Mobility    Pt received in bed and agreeable. No complaints throughout walk, no physical assistance needed. Left in bed w/ call bell in reach.   [place] Specialist Please contact via SecureChat or Rehab office at [phone]

## 2022-11-29 NOTE — Consult Note (Signed)
   [facility] CM Inpatient Consult   11/29/2022  Steven Rosario [birth year] [id number]  [place] Organization [ACO] Patient: [name]  Primary Care Provider:  Angelina Sheriff, MD, [facility] is listed to provide the transition of care   Patient screened for hospitalization with noted high risk score for unplanned readmission risk and to assess for potential [place] Management service needs for post hospital transition for care coordination.  Review of patient's electronic medical record reveals patient is admitted with HF exacerbation.  Met with the patient at the bedside, he endorses his PCP and denies issues with any SDOH needs.  States his son will provide transportation home. Explained [facility] services for post hospital follow up and patient agrees to follow up. Patient states he is to have more testing and doesn't quite know what is being planned. He's awaiting follow up from the MD staff. Patient was given an appointment reminder card with a 24 hour nurse advise line magnet.  Plan:  Continue to follow progress and disposition to assess for post hospital community care coordination/management needs.  Referral request for community care coordination: to be made closer to transition to community.  Of note, [facility] Care Management/Population Health does not replace or interfere with any arrangements made by the Inpatient Transition of Care team.  For questions contact:   Natividad Brood, RN BSN [place]  [phone] business mobile phone Toll free office [phone]  *[place]  [phone] Fax number: [phone] [email or web address]

## 2022-11-29 NOTE — H&P (View-Only) (Signed)
Referring Provider: Dr. Lottie Mussel  Primary Care Physician:  Angelina Sheriff, MD Primary Gastroenterologist:  Althia Forts   Reason for Consultation: Anemia, positive FOBT  HPI: Steven Rosario is a 89 y.o. 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 Past small bowel resection, further details unknown.   He was admitted to the hospital 10/31/2022 secondary to shortness of breath secondary to acute on chronic systolic and diastolic heart failure. He was successfully diuresed after receiving Lasix. He developed a 6-minute episode of a wide-complex tachycardia during this hospitalization and he was seen by cardiology who felt he had an atrial arrhythmia without VT.  Overall, his clinical status stabilized and he was discharged home 11/02/2022 with instructions to continue Amiodarone, Coreg and Eliquis as previously prescribed.  He presented to the ED 11/28/2022 with progressive shortness of breath. Labs in the ED showed a WBC count of 6.0.  Hemoglobin 8.6, down from 10.3 on 11/02/2022).  Platelet 207.  BUN 55 up from 42.  Creatinine 4.19 up from 3.31.  Potassium 3.5.  NR 1.7. FOBT +. Chest xray showed a small to moderate right pleural effusion. A GI consult was requested to further evaluate his anemia and FOBT.   He denies having any dysphagia or heartburn.  He takes Pantoprazole 20 mg daily several years.  Never had an EGD.  No upper or lower abdominal pain.  No nausea or vomiting.  He takes Eliquis daily, last dose was yesterday morning around 9 AM.  He takes ASA 81 mg daily, no other NSAID use.  No alcohol use.  He endorses passing intermittent black formed stools over the past month.  Approximately 2 weeks ago, he was constipated and  manually disimpacted hard stool from the rectum and he noted a small specks of blood on the toilet tissue.  He started drinking prune juice daily and since then he has been passing a normal formed stool that is easy to pass but he does not always look at his stool before he flushes.  He has undergone 3-4 colonoscopies in his lifetime and he does not recall if he had any colon polyps.  No known family history of esophageal, gastric or colorectal cancer.  No family at the bedside.   Past Medical History:  Diagnosis Date   Acute kidney injury (AKI) with acute tubular necrosis (ATN) (HCC)    Cervical disc disease    Chronic kidney disease    Coronary artery disease    Gout    Hypertension    Nephrolithiasis    Pneumonia due to COVID-19 virus    Presence of stent in LAD coronary artery    Prostate cancer (HCC)    S/P placement of cardiac pacemaker    Urinary incontinence     Past Surgical History:  Procedure Laterality Date   ANKLE FRACTURE SURGERY Right    cardiac stents x2     caudal epidural injection     CERVICAL DISCECTOMY     COLONOSCOPY     EXPLORATORY LAPAROTOMY     lumbar facet joint injection Bilateral    medical branch block Bilateral    PROSTATECTOMY     RADIOFREQUENCY ABLATION Bilateral    ROTATOR CUFF REPAIR Right    SACROILIAC JOINT ARTHRODESIS Right  SACROILIAC JOINT ARTHRODESIS Bilateral    sacroilliac joint inj     SPIROMETRY      Prior to Admission medications   Medication Sig Start Date End Date Taking? Authorizing Provider  acetaminophen (TYLENOL) 650 MG CR tablet Take 1,300 mg by mouth every evening.   Yes [provider]  amiodarone (PACERONE) 200 MG tablet Take 1 tablet (200 mg total) by mouth daily. 07/18/22  Yes Camnitz, Will Hassell Done, MD  amLODipine (NORVASC) 5 MG tablet Take 10 mg by mouth daily. 03/29/22  Yes [provider]  apixaban (ELIQUIS) 2.5 MG TABS tablet Take 2.5 mg by mouth 2 (two) times daily.   Yes [provider]  aspirin 81 MG EC tablet Take 81 mg by mouth daily.   Yes [provider]  atorvastatin (LIPITOR) 20 MG tablet Take 20 mg by mouth daily.   Yes [provider]  carvedilol (COREG) 12.5 MG tablet Take 1 tablet (12.5 mg total) by mouth 2 (two) times daily. 07/17/21  Yes Camnitz, Will Hassell Done, MD  cyanocobalamin (VITAMIN B12) 1000 MCG tablet Take 1,000 mcg by mouth daily.   Yes [provider]  FIBER PO Take 1 tablet by mouth daily.   Yes [provider]  furosemide (LASIX) 40 MG tablet Take 2 tablets (80 mg total) by mouth daily. 05/30/22  Yes Camnitz, Ocie Doyne, MD  hydrALAZINE (APRESOLINE) 25 MG tablet Take 1.5 tablets (37.5 mg total) by mouth 3 (three) times daily. 05/30/22  Yes Camnitz, Ocie Doyne, MD  isosorbide dinitrate (ISORDIL) 20 MG tablet Take 1 tablet (20 mg total) by mouth 3 (three) times daily. 05/30/22  Yes Camnitz, Will Hassell Done, MD  Multiple Vitamins-Minerals (CENTRUM SILVER PO) Take by mouth.   Yes [provider]  Multiple Vitamins-Minerals (PRESERVISION AREDS 2) CAPS Take 1 capsule by mouth 3 (three) times daily.   Yes [provider]  pantoprazole (PROTONIX) 20 MG tablet Take 20 mg by mouth daily.   Yes [provider]  potassium chloride (MICRO-K) 10 MEQ CR capsule Take 10 mEq by mouth daily. 11/08/22  Yes [provider]  VITAMIN D, CHOLECALCIFEROL, PO Take 500 mcg by mouth daily.   Yes [provider]    Current Facility-Administered Medications  Medication Dose Route Frequency Provider Last Rate Last Admin   acetaminophen (TYLENOL) tablet 650 mg  650 mg Oral Q6H PRN Virl Axe, MD       Or   acetaminophen (TYLENOL) suppository 650 mg  650 mg Rectal Q6H PRN Virl Axe, MD       amiodarone (PACERONE) tablet 200 mg  200 mg Oral Daily Virl Axe, MD   200 mg at 11/29/22 0914   atorvastatin (LIPITOR) tablet 20 mg  20 mg Oral Daily Virl Axe, MD   20 mg at 11/29/22 0914   carvedilol  (COREG) tablet 6.25 mg  6.25 mg Oral BID Virl Axe, MD   6.25 mg at 11/29/22 0913   cyanocobalamin (VITAMIN B12) tablet 1,000 mcg  1,000 mcg Oral Daily Virl Axe, MD   1,000 mcg at 11/29/22 [id number]   hydrALAZINE (APRESOLINE) tablet 37.5 mg  37.5 mg Oral TID Virl Axe, MD   37.5 mg at 11/29/22 0914   isosorbide dinitrate (ISORDIL) tablet 20 mg  20 mg Oral TID Virl Axe, MD   20 mg at 11/29/22 0913   pantoprazole (PROTONIX) injection 40 mg  40 mg Intravenous Q12H Virl Axe, MD   40 mg at 11/29/22 0914    Allergies as of  11/28/2022   (No Known Allergies)    Family History  Problem Relation Age of Onset   Diabetes Mother    Obesity Mother    Heart failure Mother    CAD Father    Bone cancer Father    Heart disease Sister    Obesity Brother    Lung cancer Son     Social History   Socioeconomic History   Marital status: Widowed    Spouse name: Not on file   Number of children: 4   Years of education: Not on file   Highest education level: 8th grade  Occupational History   Occupation: retired   Tobacco Use   Smoking status: Never   Smokeless tobacco: Never  Vaping Use   Vaping Use: Never used  Substance and Sexual Activity   Alcohol use: Not Currently    Comment: past   Drug use: Never   Sexual activity: Not on file  Other Topics Concern   Not on file  Social History Narrative   Not on file   Social Determinants of Health   Financial Resource Strain: Low Risk  (11/02/2022)   Overall Financial Resource Strain (CARDIA)    Difficulty of Paying Living Expenses: Not very hard  Food Insecurity: No Food Insecurity (11/28/2022)   Hunger Vital Sign    Worried About Running Out of Food in the Last Year: Never true    Ran Out of Food in the Last Year: Never true  Transportation Needs: No Transportation Needs (11/28/2022)   PRAPARE - Hydrologist (Medical): No    Lack of Transportation (Non-Medical): No  Physical Activity: Not on  file  Stress: Not on file  Social Connections: Not on file  Intimate Partner Violence: Not At Risk (11/28/2022)   Humiliation, Afraid, Rape, and Kick questionnaire    Fear of Current or Ex-Partner: No    Emotionally Abused: No    Physically Abused: No    Sexually Abused: No    Review of Systems: Gen: Denies fever, sweats or chills. No weight loss.  CV: Denies chest pain, palpitations or edema. Resp: Denies cough, shortness of breath of hemoptysis.  GI: See HPI. GU : Denies urinary burning, blood in urine, increased urinary frequency or incontinence. MS: Denies joint pain, muscles aches or weakness. Derm: Denies rash, itchiness, skin lesions or unhealing ulcers. Psych: Denies depression, anxiety, memory loss or confusion. Heme: + Bruises easily.  Neuro:  Denies headaches, dizziness or paresthesias. Endo:  Denies any problems with DM, thyroid or adrenal function.  Physical Exam: Vital signs in last 24 hours: Temp:  [97.5 F (36.4 C)-98.1 F (36.7 C)] 97.6 F (36.4 C) (02/29 1217) Pulse Rate:  [58-108] 108 (02/29 1217) Resp:  [14-18] 18 (02/28 1703) BP: (123-155)/(65-75) 123/75 (02/29 1217) SpO2:  [93 %-100 %] 99 % (02/29 1217) FiO2 (%):  [97 %] 97 % (02/28 1515) Weight:  [83.1 kg-83.2 kg] 83.1 kg (02/29 0012) Last BM Date : 11/27/22 General:  Alert 89 year old male hard of hearing in no acute distress. Head:  Normocephalic and atraumatic. Eyes:  No scleral icterus. Conjunctiva pink. Ears:  Normal auditory acuity. Nose:  No deformity, discharge or lesions. Mouth:  Dentition intact. No ulcers or lesions.  Neck:  Supple. No lymphadenopathy or thyromegaly.  Lungs: Breath sounds clear throughout. No wheezes, rhonchi or crackles.  Heart: Irregular rhythm, no murmurs. Abdomen:, Nondistended.  Nontender.  Positive bowel sounds to all 4 quadrants. Midline abdominal scar.  Rectal: No external  hemorrhoids or fissure.  No hemorrhoids without prolapse or bleeding.  Formed golden brown  stool in the rectal vault.  RN at the bedside at time of exam. Musculoskeletal:  Symmetrical without gross deformities.  Pulses:  Normal pulses noted. Extremities:  Without clubbing or edema. Neurologic:  Alert and  oriented x 4. No focal deficits.  Skin:  Intact without significant lesions or rashes. Psych:  Alert and cooperative. Normal mood and affect.  Intake/Output from previous day: 02/28 0701 - 02/29 0700 In: 600 [P.O.:600] Out: 2150 [Urine:2150] Intake/Output this shift: Total I/O In: 240 [P.O.:240] Out: 1400 [Urine:1400]  Lab Results: Recent Labs    11/28/22 1105 11/29/22 0042  WBC 6.0 5.4  HGB 8.6* 7.7*  HCT 27.5* 25.8*  PLT 207 181   BMET Recent Labs    11/28/22 1105 11/29/22 0042 11/29/22 1233  NA 143 141 141  K 3.5 3.9 3.9  CL 111 110 107  CO2 22 20* 24  GLUCOSE 109* 131* 147*  BUN 55* 52* 53*  CREATININE 4.19* 4.32* 4.32*  CALCIUM 8.5* 8.1* 8.5*   LFT Recent Labs    11/29/22 0042  ALBUMIN 3.0*   PT/INR Recent Labs    11/28/22 1620  LABPROT 19.6*  INR 1.7*   Hepatitis Panel No results for input(s): "HEPBSAG", "HCVAB", "HEPAIGM", "HEPBIGM" in the last 72 hours.    Studies/Results: DG Chest 2 View  Result Date: 11/28/2022 CLINICAL DATA:  Shortness of breath.  Worse with lying down. EXAM: CHEST - 2 VIEW COMPARISON:  10/31/2022. FINDINGS: 1117 hours. Left chest dual-chamber pacemaker with leads projecting over the right atrium and ventricle. Small to moderate right pleural effusion with adjacent right basilar atelectasis. Stable cardiac and mediastinal contours. No pneumothorax. IMPRESSION: Small to moderate right pleural effusion. Electronically Signed   By: Emmit Alexanders M.D.   On: 11/28/2022 11:31    IMPRESSION/PLAN:  89 year old male admitted to the hospital with recurrent shortness of breath and fatigue. Recently admitted to the hospital 1/31 - 11/01/2022 with acute on chronic systolic heart failure. LV EF 30 - 35% per ECHO 11/01/2022.  Chest x-ray 2/28 showed small to moderate right pleural effusion. -Management per the medical service and cardiology  Acute on chronic anemia. Hg 10.3 -> 8.6. + FOBT.  Intermittent black stools over the past month. Iron level not done. Received IV iron today. Never had an EGD. Last colonoscopy was 20+ years ago. Last dose of Eliquis was 9am on 2/28. -CBC in am -Transfuse for Hg < 8 -Continue Pantoprazole 40 mg IV every 12 hours -Heart healthy/renal diet  -Defer endoscopic recommendations to Dr. [name]. Patient willing to undergo any recommended endoscopic procedure that is medically necessary  History of pernicious anemia on oral B12 -Check B12 level in am  Constipation with few spots of red blood on toilet tissue after patient manually removed stool from the rectum -Miralax Q HS as tolerated  AKI on CKD. Cr 4.19 -> 4.32  History of atrial fibrillation on Eliquis, Amiodarone and Carvedilol. Last does of Eliquis was 9am on 2/28. -Continue to hold Eliquis  CAD s/p DES x 2 in 2012. S/P pacemaker placement 2018.   CHF with LV EF 30 - 35% per ECHO 11/01/2022    Patrecia Pour Kennedy-Smith  11/29/2022, 3:59PM

## 2022-11-30 ENCOUNTER — Encounter (HOSPITAL_COMMUNITY)
Admission: EM | Disposition: A | Discharge status: Home Health | Payer: Self-pay | Source: Home / Self Care | Attending: Internal Medicine

## 2022-11-30 ENCOUNTER — Encounter (HOSPITAL_COMMUNITY): Payer: Self-pay | Admitting: Internal Medicine

## 2022-11-30 ENCOUNTER — Inpatient Hospital Stay (HOSPITAL_COMMUNITY): Payer: Medicare HMO | Admitting: Anesthesiology

## 2022-11-30 DIAGNOSIS — I119 Hypertensive heart disease without heart failure: Secondary | ICD-10-CM

## 2022-11-30 DIAGNOSIS — I2510 Atherosclerotic heart disease of native coronary artery without angina pectoris: Secondary | ICD-10-CM

## 2022-11-30 DIAGNOSIS — K317 Polyp of stomach and duodenum: Secondary | ICD-10-CM

## 2022-11-30 DIAGNOSIS — D509 Iron deficiency anemia, unspecified: Secondary | ICD-10-CM | POA: Diagnosis not present

## 2022-11-30 DIAGNOSIS — J90 Pleural effusion, not elsewhere classified: Secondary | ICD-10-CM | POA: Diagnosis not present

## 2022-11-30 DIAGNOSIS — I5021 Acute systolic (congestive) heart failure: Secondary | ICD-10-CM | POA: Diagnosis not present

## 2022-11-30 DIAGNOSIS — K2970 Gastritis, unspecified, without bleeding: Secondary | ICD-10-CM

## 2022-11-30 HISTORY — PX: ESOPHAGOGASTRODUODENOSCOPY (EGD) WITH PROPOFOL: SHX5813

## 2022-11-30 LAB — CBC
HCT: 27.7 % — ABNORMAL LOW (ref 39.0–52.0)
Hemoglobin: 8.5 g/dL — ABNORMAL LOW (ref 13.0–17.0)
MCH: 25.4 pg — ABNORMAL LOW (ref 26.0–34.0)
MCHC: 30.7 g/dL (ref 30.0–36.0)
MCV: 82.7 fL (ref 80.0–100.0)
Platelets: 200 10*3/uL (ref 150–400)
RBC: 3.35 MIL/uL — ABNORMAL LOW (ref 4.22–5.81)
RDW: 17 % — ABNORMAL HIGH (ref 11.5–15.5)
WBC: 7.1 10*3/uL (ref 4.0–10.5)
nRBC: 0 % (ref 0.0–0.2)

## 2022-11-30 LAB — BASIC METABOLIC PANEL
Anion gap: 12 (ref 5–15)
BUN: 57 mg/dL — ABNORMAL HIGH (ref 8–23)
CO2: 22 mmol/L (ref 22–32)
Calcium: 8.5 mg/dL — ABNORMAL LOW (ref 8.9–10.3)
Chloride: 106 mmol/L (ref 98–111)
Creatinine, Ser: 4.47 mg/dL — ABNORMAL HIGH (ref 0.61–1.24)
GFR, Estimated: 12 mL/min — ABNORMAL LOW (ref >60)
Glucose, Bld: 115 mg/dL — ABNORMAL HIGH (ref 70–99)
Potassium: 4.1 mmol/L (ref 3.5–5.1)
Sodium: 140 mmol/L (ref 135–145)

## 2022-11-30 LAB — RETICULOCYTES
Immature Retic Fract: 22.8 % — ABNORMAL HIGH (ref 2.3–15.9)
RBC.: 3.33 MIL/uL — ABNORMAL LOW (ref 4.22–5.81)
Retic Count, Absolute: 59.6 10*3/uL (ref 19.0–186.0)
Retic Ct Pct: 1.8 % (ref 0.4–3.1)

## 2022-11-30 LAB — VITAMIN B12: Vitamin B-12: 703 pg/mL (ref 180–914)

## 2022-11-30 LAB — IRON AND TIBC
Iron: 229 ug/dL — ABNORMAL HIGH (ref 45–182)
Saturation Ratios: 70 % — ABNORMAL HIGH (ref 17.9–39.5)
TIBC: 326 ug/dL (ref 250–450)
UIBC: 97 ug/dL

## 2022-11-30 LAB — FERRITIN: Ferritin: 39 ng/mL (ref 24–336)

## 2022-11-30 LAB — GLUCOSE, CAPILLARY: Glucose-Capillary: 107 mg/dL — ABNORMAL HIGH (ref 70–99)

## 2022-11-30 LAB — FOLATE: Folate: 12.6 ng/mL (ref >5.9)

## 2022-11-30 SURGERY — ESOPHAGOGASTRODUODENOSCOPY (EGD) WITH PROPOFOL
Anesthesia: Monitor Anesthesia Care

## 2022-11-30 MED ORDER — SODIUM CHLORIDE 0.9 % IV SOLN: INTRAVENOUS | Status: DC

## 2022-11-30 MED ORDER — DARBEPOETIN ALFA 60 MCG/0.3ML IJ SOSY
60.0000 ug | PREFILLED_SYRINGE | Freq: Once | INTRAMUSCULAR | Status: AC
  Administered 2022-11-30: 60 ug via SUBCUTANEOUS
  Filled 2022-11-30: qty 0.3

## 2022-11-30 MED ORDER — PROPOFOL 500 MG/50ML IV EMUL
INTRAVENOUS | Status: DC | PRN
  Administered 2022-11-30: 100 ug/kg/min via INTRAVENOUS

## 2022-11-30 MED ORDER — PEG-KCL-NACL-NASULF-NA ASC-C 100 G PO SOLR
0.5000 | Freq: Once | ORAL | Status: AC
  Administered 2022-11-30: 100 g via ORAL
  Filled 2022-11-30 (×2): qty 1

## 2022-11-30 MED ORDER — PEG-KCL-NACL-NASULF-NA ASC-C 100 G PO SOLR
0.5000 | Freq: Once | ORAL | Status: AC
  Administered 2022-12-01: 100 g via ORAL
  Filled 2022-11-30: qty 1

## 2022-11-30 MED ORDER — SODIUM CHLORIDE 0.9 % IV SOLN: INTRAVENOUS | Status: DC | PRN

## 2022-11-30 MED ORDER — PEG-KCL-NACL-NASULF-NA ASC-C 100 G PO SOLR: 1.0000 | Freq: Once | ORAL | Status: DC

## 2022-11-30 MED ORDER — LIDOCAINE 2% (20 MG/ML) 5 ML SYRINGE
INTRAMUSCULAR | Status: DC | PRN
  Administered 2022-11-30: 20 mg via INTRAVENOUS

## 2022-11-30 MED ORDER — PANTOPRAZOLE SODIUM 40 MG PO TBEC
40.0000 mg | DELAYED_RELEASE_TABLET | Freq: Two times a day (BID) | ORAL | Status: DC
  Administered 2022-12-01 – 2022-12-04 (×7): 40 mg via ORAL
  Filled 2022-11-30 (×7): qty 1

## 2022-11-30 MED ORDER — PROPOFOL 10 MG/ML IV BOLUS
INTRAVENOUS | Status: DC | PRN
  Administered 2022-11-30: 20 mg via INTRAVENOUS

## 2022-11-30 SURGICAL SUPPLY — 15 items

## 2022-11-30 NOTE — TOC Initial Note (Addendum)
Transition of Care [facility]) - Initial/Assessment Note    Patient Details  Name: Steven Rosario MRN: [id number] Date of Birth: [birth year]  Transition of Care [facility]) CM/SW Contact:    Zenon Mayo, RN Phone Number: 11/30/2022, 4:28 PM  Clinical Narrative:                 Patient is from home alone, his son stays 5 miles away.  He has 2 walkers , one cane, he still drives.  He states he has a physical therapist coming out to see him but not sure what company he is with. He states his son will  transport him home at dc.  TOC following. He is active with [facility] in [place].   Expected Discharge Plan: [place] Barriers to Discharge: Continued Medical Work up   Patient Goals and CMS Choice Patient states their goals for this hospitalization and ongoing recovery are:: return home   Choice offered to / list presented to : NA      Expected Discharge Plan and Services In-house Referral: NA Discharge Planning Services: CM Consult Post Acute Care Choice: [place] arrangements for the past 2 months: Single Family Home                   DME Agency: NA       HH Arranged: NA          Prior Living Arrangements/Services Living arrangements for the past 2 months: Single Family Home Lives with:: Self Patient language and need for interpreter reviewed:: Yes Do you feel safe going back to the place where you live?: Yes      Need for Family Participation in Patient Care: Yes (Comment) Care giver support system in place?: Yes (comment) Current home services: DME (2 walkers, 1 cane) Criminal Activity/Legal Involvement Pertinent to Current Situation/Hospitalization: No - Comment as needed  Activities of Daily Living Home Assistive Devices/Equipment: Walker (specify type) ADL Screening (condition at time of admission) Patient's cognitive ability adequate to safely complete daily activities?: Yes Is the patient deaf or have difficulty hearing?: Yes Does the  patient have difficulty seeing, even when wearing glasses/contacts?: No Does the patient have difficulty concentrating, remembering, or making decisions?: No Patient able to express need for assistance with ADLs?: Yes Does the patient have difficulty dressing or bathing?: No Independently performs ADLs?: Yes (appropriate for developmental age) Does the patient have difficulty walking or climbing stairs?: No Weakness of Legs: Both Weakness of Arms/Hands: Both  Permission Sought/Granted                  Emotional Assessment Appearance:: Appears stated age Attitude/Demeanor/Rapport: Engaged Affect (typically observed): Appropriate Orientation: : Oriented to Self, Oriented to  Time, Oriented to Place, Oriented to Situation Alcohol / Substance Use: Not Applicable Psych Involvement: No (comment)  Admission diagnosis:  Acute combined systolic and diastolic heart failure (HCC) [I50.41] Renal dysfunction [N28.9] Acute on chronic systolic congestive heart failure (HCC) [I50.23] Occult GI bleeding [R19.5] Patient Active Problem List   Diagnosis Date Noted   Occult GI bleeding 11/29/2022   Renal dysfunction 11/29/2022   Acute combined systolic and diastolic heart failure ([place]) 11/28/2022   Arrhythmia 11/02/2022   Acute kidney injury superimposed on chronic kidney disease ([place])    Coronary artery disease    Essential hypertension, benign 11/17/2019   Hypercholesteremia 11/17/2019   Pacemaker Saint Jude device implant date 08/23/2017 11/17/2019   Senile arthritis 11/17/2019   Esophageal reflux 11/17/2019  Pernicious anemia 11/17/2019   Gout, unspecified 11/17/2019   PCP:  Angelina Sheriff, MD Pharmacy:   [place], [place] [address] Phone: [phone] Fax: [phone]  [id number] [place] [place]Phone: [phone]:  [phone]    Social Determinants of Health (SDOH) Social History: SDOH Screenings   Food Insecurity: No Food Insecurity (11/28/2022)  Housing: Low Risk  (11/28/2022)  Transportation Needs: No Transportation Needs (11/28/2022)  Utilities: Not At Risk (11/28/2022)  Alcohol Screen: Low Risk  (11/02/2022)  Financial Resource Strain: Low Risk  (11/02/2022)  Tobacco Use: Low Risk  (11/30/2022)   SDOH Interventions:     Readmission Risk Interventions    11/30/2022    4:26 PM  Readmission Risk Prevention Plan  Transportation Screening Complete  HRI or Home Care Consult Complete  Palliative Care Screening Not Applicable  Medication Review (RN Care Manager) Complete

## 2022-11-30 NOTE — Anesthesia Preprocedure Evaluation (Addendum)
Anesthesia Evaluation  Patient identified by MRN, date of birth, ID band Patient awake    Reviewed: Allergy & Precautions, NPO status , Patient's Chart, lab work & pertinent test results  Airway Mallampati: I  TM Distance: >3 FB Neck ROM: Full    Dental  (+) Teeth Intact, Dental Advisory Given   Pulmonary    breath sounds clear to auscultation       Cardiovascular hypertension, + CAD  + pacemaker  Rhythm:Regular Rate:Normal  Echo:  1. Left ventricular ejection fraction, by estimation, is 30 to 35%. The  left ventricle has moderately decreased function. The left ventricle  demonstrates regional wall motion abnormalities (see scoring  diagram/findings for description). Left ventricular   diastolic parameters are consistent with Grade II diastolic dysfunction  (pseudonormalization). There is akinesis of the left ventricular,  mid-apical inferior wall, apical segment and anterolateral wall. There is  aneurysmal dilation of the LV apex.   2. Right ventricular systolic function is moderately reduced. The right  ventricular size is normal. There is normal pulmonary artery systolic  pressure.   3. Left atrial size was mildly dilated.   4. Right atrial size was mildly dilated.   5. The mitral valve is normal in structure. Mild mitral valve  regurgitation. No evidence of mitral stenosis.   6. The aortic valve is tricuspid. There is moderate calcification of the  aortic valve. Aortic valve regurgitation is trivial. Aortic valve  sclerosis/calcification is present, without any evidence of aortic  stenosis.   7. Aortic dilatation noted. There is mild dilatation of the ascending  aorta, measuring 40 mm.   8. The inferior vena cava is normal in size with greater than 50%  respiratory variability, suggesting right atrial pressure of 3 mmHg.     Neuro/Psych negative neurological ROS     GI/Hepatic Neg liver ROS,GERD  ,,  Endo/Other   negative endocrine ROS    Renal/GU Renal disease     Musculoskeletal  (+) Arthritis ,    Abdominal   Peds  Hematology negative hematology ROS (+)   Anesthesia Other Findings   Reproductive/Obstetrics                             Anesthesia Physical Anesthesia Plan  ASA: 4  Anesthesia Plan: MAC   Post-op Pain Management: Minimal or no pain anticipated   Induction: Intravenous  PONV Risk Score and Plan: 0 and Propofol infusion  Airway Management Planned: Natural Airway and Simple Face Mask  Additional Equipment: None  Intra-op Plan:   Post-operative Plan:   Informed Consent: I have reviewed the patients History and Physical, chart, labs and discussed the procedure including the risks, benefits and alternatives for the proposed anesthesia with the patient or authorized representative who has indicated his/her understanding and acceptance.     Dental advisory given  Plan Discussed with: CRNA  Anesthesia Plan Comments:        Anesthesia Quick Evaluation

## 2022-11-30 NOTE — Interval H&P Note (Signed)
History and Physical Interval Note:  11/30/2022 12:54 PM  Steven Rosario  has presented today for surgery, with the diagnosis of Anemia, melena.  The various methods of treatment have been discussed with the patient and family. After consideration of risks, benefits and other options for treatment, the patient has consented to  Procedure(s): ESOPHAGOGASTRODUODENOSCOPY (EGD) WITH PROPOFOL (N/A) as a surgical intervention.  The patient's history has been reviewed, patient examined, no change in status, stable for surgery.  I have reviewed the patient's chart and labs.  Questions were answered to the patient's satisfaction.     Sharyn Creamer

## 2022-11-30 NOTE — Transfer of Care (Signed)
Immediate Anesthesia Transfer of Care Note  Patient: Steven Rosario  Procedure(s) Performed: ESOPHAGOGASTRODUODENOSCOPY (EGD) WITH PROPOFOL  Patient Location: Endoscopy Unit  Anesthesia Type:MAC  Level of Consciousness: sedated  Airway & Oxygen Therapy: Patient connected to nasal cannula oxygen  Post-op Assessment: Post -op Vital signs reviewed and stable  Post vital signs: stable  Last Vitals:  Vitals Value Taken Time  BP    Temp    Pulse    Resp    SpO2      Last Pain:  Vitals:   11/30/22 1155  TempSrc: Temporal  PainSc: 0-No pain         Complications: No notable events documented.

## 2022-11-30 NOTE — Consult Note (Addendum)
[facility] Nephrology Consultation Note  Requesting MD: Dr. Velna Ochs  Reason for consult: AKI on CKD IV  HPI:  Steven Rosario is a 89 y.o. male with history of hypertension, nephrolithiasis, CAD, pacemaker, gout, CKD stage IV with baseline creatinine level seems to be around 3.1-3.3, CHF presented with dyspnea on exertion and fatigue seen as a consultation for AKI on CKD.  For CKD he was seen by Dr. Candiss Norse at [facility] when GFR was around 19.  It seems like his creatinine level has been fluctuating with the use of diuretics.  The creatinine level elevated to 4.19 on admission.  He was found to have acute CHF exacerbation therefore treated with IV diuretics.  He clinically responded well.  Urine output increased however the creatinine level continue to go up to 4.47 today. The repeat echo showed a EF of 30 to [id number], grade 2 diastolic dysfunction. He is currently on carvedilol, hydralazine, Imdur.  Receiving iron for the management of anemia. Diuretics on hold because of worsening creatinine level.  At home he takes furosemide 80 mg daily.  Per patient he was skipping the second dose of furosemide often. He denies headache, dizziness, nausea, vomiting, chest pain, shortness of breath.  He feels much better today.  PMHx:   Past Medical History:  Diagnosis Date   Acute kidney injury (AKI) with acute tubular necrosis (ATN) ([place])    Cervical disc disease    Chronic kidney disease    Coronary artery disease    Gout    Hypertension    Nephrolithiasis    Pneumonia due to COVID-19 virus    Presence of stent in LAD coronary artery    Prostate cancer (HCC)    S/P placement of cardiac pacemaker    Urinary incontinence     Past Surgical History:  Procedure Laterality Date   ANKLE FRACTURE SURGERY Right    cardiac stents x2     caudal epidural injection     CERVICAL DISCECTOMY     COLONOSCOPY     EXPLORATORY LAPAROTOMY     lumbar facet joint injection  Bilateral    medical branch block Bilateral    PROSTATECTOMY     RADIOFREQUENCY ABLATION Bilateral    ROTATOR CUFF REPAIR Right    SACROILIAC JOINT ARTHRODESIS Right    SACROILIAC JOINT ARTHRODESIS Bilateral    sacroilliac joint inj     SPIROMETRY      Family Hx:  Family History  Problem Relation Age of Onset   Diabetes Mother    Obesity Mother    Heart failure Mother    CAD Father    Bone cancer Father    Heart disease Sister    Obesity Brother    Lung cancer Son     Social History:  reports that he has never smoked. He has never used smokeless tobacco. He reports that he does not currently use alcohol. He reports that he does not use drugs.  Allergies: No Known Allergies  Medications: Prior to Admission medications   Medication Sig Start Date End Date Taking? Authorizing Provider  acetaminophen (TYLENOL) 650 MG CR tablet Take 1,300 mg by mouth every evening.   Yes [provider]  amiodarone (PACERONE) 200 MG tablet Take 1 tablet (200 mg total) by mouth daily. 07/18/22  Yes Camnitz, Will Hassell Done, MD  amLODipine (NORVASC) 5 MG tablet Take 10 mg by mouth daily. 03/29/22  Yes [provider]  apixaban (ELIQUIS) 2.5 MG TABS tablet Take 2.5 mg  by mouth 2 (two) times daily.   Yes [provider]  aspirin 81 MG EC tablet Take 81 mg by mouth daily.   Yes [provider]  atorvastatin (LIPITOR) 20 MG tablet Take 20 mg by mouth daily.   Yes [provider]  carvedilol (COREG) 12.5 MG tablet Take 1 tablet (12.5 mg total) by mouth 2 (two) times daily. 07/17/21  Yes Camnitz, Will Hassell Done, MD  cyanocobalamin (VITAMIN B12) 1000 MCG tablet Take 1,000 mcg by mouth daily.   Yes [provider]  FIBER PO Take 1 tablet by mouth daily.   Yes [provider]  furosemide (LASIX) 40 MG tablet Take 2 tablets (80 mg total) by mouth daily. 05/30/22  Yes Camnitz, Ocie Doyne, MD  hydrALAZINE (APRESOLINE) 25 MG tablet Take 1.5 tablets (37.5 mg  total) by mouth 3 (three) times daily. 05/30/22  Yes Camnitz, Ocie Doyne, MD  isosorbide dinitrate (ISORDIL) 20 MG tablet Take 1 tablet (20 mg total) by mouth 3 (three) times daily. 05/30/22  Yes Camnitz, Will Hassell Done, MD  Multiple Vitamins-Minerals (CENTRUM SILVER PO) Take by mouth.   Yes [provider]  Multiple Vitamins-Minerals (PRESERVISION AREDS 2) CAPS Take 1 capsule by mouth 3 (three) times daily.   Yes [provider]  pantoprazole (PROTONIX) 20 MG tablet Take 20 mg by mouth daily.   Yes [provider]  potassium chloride (MICRO-K) 10 MEQ CR capsule Take 10 mEq by mouth daily. 11/08/22  Yes [provider]  VITAMIN D, CHOLECALCIFEROL, PO Take 500 mcg by mouth daily.   Yes [provider]    I have reviewed the patient's current medications.  Labs: Renal Panel: Recent Labs  Lab 11/28/22 1105 11/29/22 0042 11/29/22 1233 11/30/22 0127  NA 143 141 141 140  K 3.5 3.9 3.9 4.1  CL 111 110 107 106  CO2 22 20* 24 22  GLUCOSE 109* 131* 147* 115*  BUN 55* 52* 53* 57*  CREATININE 4.19* 4.32* 4.32* 4.47*  CALCIUM 8.5* 8.1* 8.5* 8.5*  PHOS  --  4.0  --   --      CBC:    Latest Ref Rng & Units 11/30/2022    1:27 AM 11/29/2022   12:42 AM 11/28/2022   11:05 AM  CBC  WBC 4.0 - 10.5 K/uL 7.1  5.4  6.0   Hemoglobin 13.0 - 17.0 g/dL 8.5  7.7  8.6   Hematocrit 39.0 - 52.0 % 27.7  25.8  27.5   Platelets 150 - 400 K/uL 200  181  207      Anemia Panel:  Recent Labs    11/01/22 0109 11/02/22 0222 11/28/22 1105 11/29/22 0042 11/30/22 0127  HGB 9.1* 10.3* 8.6* 7.7* 8.5*  MCV 82.0 82.8 82.6 84.0 82.7  VITAMINB12  --   --   --   --  703  FOLATE  --   --   --   --  12.6  FERRITIN  --   --   --   --  39  TIBC  --   --   --   --  326  IRON  --   --   --   --  229*  RETICCTPCT  --   --   --   --  1.8    Recent Labs  Lab 11/29/22 0042  ALBUMIN 3.0*    No results found for: "HGBA1C"  ROS:  Pertinent items noted in HPI and remainder  of comprehensive ROS otherwise negative.  Physical Exam: Vitals:   11/30/22 1437 11/30/22 1438  BP: (!) 171/68   Pulse:  61  Resp:    Temp:    SpO2:       General exam: Appears calm and comfortable  Respiratory system: Clear to auscultation. Respiratory effort normal. No wheezing or crackle Cardiovascular system: S1 & S2 heard, RRR.  No pedal edema. Gastrointestinal system: Abdomen is nondistended, soft and nontender. Normal bowel sounds heard. Central nervous system: Alert and oriented. No focal neurological deficits. Extremities: Symmetric 5 x 5 power. Skin: No rashes, lesions or ulcers Psychiatry: Judgement and insight appear normal. Mood & affect appropriate.   Assessment/Plan:  # Acute on chronic combined systolic and diastolic CHF: Echo with EF of 30 to 35% and grade 2 diastolic dysfunction.  He was taking less amount of Lasix which might be causing the acute exacerbation.  He was treated with IV Lasix with significant clinical improvement.  He is euvolemic now.  # Acute kidney injury on CKD stage IV, baseline creatinine level around 3.1-3.3 and follows with Dr. Candiss Norse at [facility].  AKI presumably due to CHF exacerbation versus this might be his new baseline (progressive CKD) after maintaining euvolemia.  Fortunately, he has no signs or symptoms of uremia and volume looks acceptable.  We can probably resume furosemide at home dose tomorrow as long as his GFR remains stable.  No need for dialysis.  # Anemia multifactorial etiology including CKD, possible GI bleed and iron deficiency: Receiving iron.  Status post upper GI endoscopy, on PPI and plan for colonoscopy tomorrow.  I will order a dose of Aranesp.  # Hypertension/volume: Continue current antihypertensive/cardiac medication.  Plan to resume diuretics tomorrow after reviewing lab.  Thank you for the consult.  We will continue to follow.  Discussed with the patient's primary nephrologist Dr. Candiss Norse as  well.   Rekia Kujala Tanna Furry 11/30/2022, 2:52 PM  [employer].

## 2022-11-30 NOTE — Progress Notes (Incomplete)
Subjective:  Steven Steven Rosario was feeling good this morning, slept fairly well. Discussed improvement in hemoglobin levels and iron study results. Plan for endoscopy later today. All of his questions were answered.    Objective: Vitals over previous 24hr: Vitals:   11/30/22 1335 11/30/22 1437 11/30/22 1438 11/30/22 1540  BP: (!) 153/68 (!) 171/68    Pulse: 63  61   Resp: (!) 24   16  Temp:    (!) 97.4 F (36.3 C)  TempSrc:    Oral  SpO2: 94%     Weight:      Height:        General:                       awake and alert, lying comfortably in bed, cooperative and pleasant, not in acute distress Lungs:                          normal respiratory effort, breathing unlabored, symmetrical chest rise, no crackles or wheezing Cardiac:                        regular rate and rhythm, normal S1 and S2, no pitting edema Abdomen:                     soft and non-distended, normoactive bowel sounds present in all four quadrants, no tenderness to palpation or guarding Neurologic:                   oriented to person-place-time, moving all extremities, no gross focal deficits Psychiatric:                   euthymic mood with congruent affect, intelligible speech     Assessment/Plan: Steven Rosario is a 89 year old male with a past medical history of hypertension, hyperlipidemia, pernicious anemia, CKD IV, CAD, CHF, and arrhythmia post pacemaker 2018 who presented with breath shortness and fatigue, now admitted for acute on chronic heart failure exacerbation.       ---Acute heart failure reduced ejection fraction exacerbation ---Chronic right-sided small-moderate pleural effusion Patient has history of heart failure reduced ejection fraction. Most recent echocardiogram performed 2-1 demonstrated EF 30-35, grade II diastolic dysfunction, and mild LA-RA dilation. Recently admitted in 10-2022 for acute exacerbation, discharged with home medications including furosemide and carvedilol.  Estimated dry weight is 80kg. He presented on 2-28 with breath shortness and fatigue that started about two weeks after prior discharge. Upon arrival, patient was hemodynamically stable and without supplemental oxygen. Laboratory tests revealed elevated creatinine and BNP. Exam demonstrated evidence of fluid overload. Chest radiograph notable for persistence of chronic right small-moderate pleural effusion. Presentation is consistent with acute heart failure exacerbation secondary to cardiorenal syndrome. Two doses of intravenous furosemide '60mg'$  precipitated net fluid loss 3.3L and patient now appears euvolemic on exam. > Carvedilol 6.'125mg'$  q12 > Isosorbide dinitrate '20mg'$  q8 > Hydralazine 37.'5mg'$  q8 > Diet heart healthy > Monitor ins-outs > Trend BMP q24 RESUME HOME FUROSEMIDE DOSE OF '80MG'$  TWICE DAILY, WHICH MAY PREVENT FUTURE EXACERBATIONS AS ADHERENCE HAS BEEN INCONSISTENT      ---Normocytic iron-deficiency anemia  ---Positive fecal occult blood test  ---History of hemorrhoids  Patient has history of chronic anemia likely secondary to CKD IV and iron deficiency. Recent outpatient study revealed ferritin 47, iron 29, and saturation 9. Reports intermittent red-colored toilet paper and some straining  with defecation. History also notable for possible melena on morning of admission. Upon arrival, FOBT positive and hemoglobin was 8.6 below previous baseline around 10. Hemoglobin dropped to 7.7 on hospitalization day two, raising concern for acute bleed, but has since remained stable around 8.0. Intravenous pantoprazole and ferric gluconate started to correct calculated iron deficit of '708mg'$ . Gastroenterology team planning upper endoscopy later today 3-1. > Gastroenterology consult, appreciate recommendations > Ferric gluconate '125mg'$  x3 > Pantoprazole '40mg'$  q12 IV > Trend CBC q24 CONTINUE PANTOPRAZOLE TWICE DAILY FOR EIGHT WEEKS IRON SUPPLEMENTATION UPON DISCHARGE    ---Acute kidney  injury ---Chronic kidney disease IV Patient has history of chronic kidney disease with recent baseline creatinine around 3.0-3.3. He currently follows with [facility] as outpatient. Upon arrival, creatinine elevated above baseline to 4.19. Exam notable for evidence of volume overload. Given concurrent heart failure, etiology likely prerenal secondary to cardiorenal syndrome. Diuresis with intravenous furosemide started, discontinued on 2-29 given euvolemic exam and worsening renal function.  > Consult nephrology, appreciate recommendations > Trend BMP q24     ---Arrhythmia post pacemaker 2018 Patient has history of arrhythmia, atrioventricular pacemaker was placed in 2018. Currently takes amiodarone, aspirin, and apixaban. Upon arrival, electrocardiogram demonstrated sinus ventricular-paced rhythm unchanged from prior study. > Amiodarone '200mg'$  q24 > Hold apixaban given possible gastrointestinal bleed > Hold aspirin given possible gastrointestinal bleed     ---Coronary artery disease post stent ---Hyperlipidemia Patient has history of coronary artery disease post two LAD stents placed in 2012. He takes aspirin and atorvastatin at home. > Atorvastatin '20mg'$  q24 > Hold aspirin given possible gastrointestinal bleed     ---Hypertension Patient has history of hypertension managed at home with amlodipine. He also takes carvedilol, isosorbide-dinitrate, hydralazine, and furosemide. > Carvedilol 6.'125mg'$  q12 > Isosorbide dinitrate '20mg'$  q8 > Hydralazine 37.'5mg'$  q8 > Hold amlodipine given possible gastrointestinal bleed     ---Gastroesophageal reflux disease Patient has a history of gastroesophageal reflux disease managed with pantoprazole at home. Intravenous pantoprazole started at higher dose during current hospitalization given concern for possible gastrointestinal bleed. > Pantoprazole '40mg'$  q12 IV CONTINUE TWICE DAILY FOR EIGHT WEEKS     ---Vitamin B12 and D deficiency Patient has  history of vitamin B12 and D deficiency managed at home with respective supplements. > Cyanocobalamin 1000ug q24     Principal Problem:   Acute combined systolic and diastolic heart failure (HCC) Active Problems:   Occult GI bleeding   Renal dysfunction    Prior to Admission Living Arrangement: home living alone Anticipated Discharge Location: home Barriers to Discharge: medical management Dispo: Anticipated discharge in approximately 2-3 day(s).    Roswell Nickel, MD Internal Medicine PGY-1 Pager [phone]  After 5pm on weekdays and 1pm on weekends: On Call pager [phone]

## 2022-11-30 NOTE — Progress Notes (Signed)
Subjective:  Mr Ricarte was feeling good this morning, slept fairly well. Discussed improvement in hemoglobin levels and iron study results. Plan for endoscopy later today. All of his questions were answered.    Objective: Vitals over previous 24hr: Vitals:   11/29/22 1614 11/29/22 2015 11/30/22 0059 11/30/22 0503  BP: 136/64 137/60 (!) 149/66 (!) 140/62  Pulse: 61  60 60  Resp:      Temp: 97.6 F (36.4 C) (!) 97.5 F (36.4 C) 98.5 F (36.9 C) 97.9 F (36.6 C)  TempSrc: Oral Oral Oral Oral  SpO2: 99%  94% 99%  Weight:   83.1 kg   Height:        General:                       awake and alert, lying comfortably in bed, cooperative and pleasant, not in acute distress Lungs:                          normal respiratory effort, breathing unlabored, symmetrical chest rise, no crackles or wheezing Cardiac:                        regular rate and rhythm, normal S1 and S2, no pitting edema Abdomen:                     soft and non-distended, normoactive bowel sounds present in all four quadrants, no tenderness to palpation or guarding Neurologic:                   oriented to person-place-time, moving all extremities, no gross focal deficits Psychiatric:                   euthymic mood with congruent affect, intelligible speech     Assessment/Plan: Mr Rouland is a 89 year old male with a past medical history of hypertension, hyperlipidemia, pernicious anemia, CKD IV, CAD, CHF, and arrhythmia post pacemaker 2018 who presented with breath shortness and fatigue, now admitted for acute on chronic heart failure exacerbation.       ---Acute heart failure reduced ejection fraction exacerbation ---Chronic right-sided small-moderate pleural effusion Patient has history of heart failure reduced ejection fraction. Most recent echocardiogram performed 2-1 demonstrated EF 30-35, grade II diastolic dysfunction, and mild LA-RA dilation. Recently admitted in 10-2022 for acute exacerbation,  discharged with home medications including furosemide and carvedilol. Estimated dry weight is 80kg. He presented on 2-28 with breath shortness and fatigue that started about two weeks after prior discharge. Upon arrival, patient was hemodynamically stable and without supplemental oxygen. Laboratory tests revealed elevated creatinine and BNP. Exam demonstrated evidence of fluid overload. Chest radiograph notable for persistence of chronic right small-moderate pleural effusion. Presentation is consistent with acute heart failure exacerbation secondary to cardiorenal syndrome. Two doses of intravenous furosemide '60mg'$  precipitated net fluid loss 3.3L and patient now appears euvolemic on exam. > Carvedilol 6.'125mg'$  q12 > Isosorbide dinitrate '20mg'$  q8 > Hydralazine 37.'5mg'$  q8 > Diet heart healthy > Monitor ins-outs > Trend BMP q24     ---Normocytic iron-deficiency anemia  ---Positive fecal occult blood test  ---History of hemorrhoids  Patient has history of chronic anemia likely secondary to CKD IV and iron deficiency. Recent outpatient study revealed ferritin 47, iron 29, and saturation 9. Reports intermittent red-colored toilet paper and some straining with defecation. History also notable for possible melena on morning of admission.  Upon arrival, FOBT positive and hemoglobin was 8.6 below previous baseline around 10. Hemoglobin dropped to 7.7 on hospitalization day two, raising concern for acute bleed, but has since remained stable around 8.0. Intravenous pantoprazole and ferric gluconate started to correct calculated iron deficit of '708mg'$ . Gastroenterology team planning upper endoscopy later today 3-1. > Gastroenterology consult, appreciate recommendations > Ferric gluconate '125mg'$  x3 > Pantoprazole '40mg'$  q12 IV > Trend CBC q24   ---Acute kidney injury ---Chronic kidney disease IV Patient has history of chronic kidney disease with recent baseline creatinine around 3.0-3.3. He currently follows with  [facility] as outpatient. Upon arrival, creatinine elevated above baseline to 4.19. Exam notable for evidence of volume overload. Given concurrent heart failure, etiology likely prerenal secondary to cardiorenal syndrome. Diuresis with intravenous furosemide started, discontinued on 2-29 given euvolemic exam and worsening renal function.  > Consult nephrology, appreciate recommendations > Trend BMP q24     ---Arrhythmia post pacemaker 2018 Patient has history of arrhythmia, atrioventricular pacemaker was placed in 2018. Currently takes amiodarone, aspirin, and apixaban. Upon arrival, electrocardiogram demonstrated sinus ventricular-paced rhythm unchanged from prior study. > Amiodarone '200mg'$  q24 > Hold apixaban given possible gastrointestinal bleed > Hold aspirin given possible gastrointestinal bleed     ---Coronary artery disease post stent ---Hyperlipidemia Patient has history of coronary artery disease post two LAD stents placed in 2012. He takes aspirin and atorvastatin at home. > Atorvastatin '20mg'$  q24 > Hold aspirin given possible gastrointestinal bleed     ---Hypertension Patient has history of hypertension managed at home with amlodipine. He also takes carvedilol, isosorbide-dinitrate, hydralazine, and furosemide. > Carvedilol 6.'125mg'$  q12 > Isosorbide dinitrate '20mg'$  q8 > Hydralazine 37.'5mg'$  q8 > Hold amlodipine given possible gastrointestinal bleed     ---Gastroesophageal reflux disease Patient has a history of gastroesophageal reflux disease managed with pantoprazole at home. Intravenous pantoprazole started at higher dose during current hospitalization given concern for possible gastrointestinal bleed. > Pantoprazole '40mg'$  q12 IV     ---Vitamin B12 and D deficiency Patient has history of vitamin B12 and D deficiency managed at home with respective supplements. > Cyanocobalamin 1000ug q24     Principal Problem:   Acute combined systolic and diastolic heart failure  (HCC) Active Problems:   Occult GI bleeding   Renal dysfunction    Prior to Admission Living Arrangement: home living alone Anticipated Discharge Location: home Barriers to Discharge: medical management Dispo: Anticipated discharge in approximately 2-3 day(s).    Roswell Nickel, MD Internal Medicine PGY-1 Pager [phone]  After 5pm on weekdays and 1pm on weekends: On Call pager [phone]

## 2022-11-30 NOTE — Anesthesia Postprocedure Evaluation (Signed)
Anesthesia Post Note  Patient: Steven Rosario  Procedure(s) Performed: ESOPHAGOGASTRODUODENOSCOPY (EGD) WITH PROPOFOL     Patient location during evaluation: PACU Anesthesia Type: MAC Level of consciousness: awake and alert Pain management: pain level controlled Vital Signs Assessment: post-procedure vital signs reviewed and stable Respiratory status: spontaneous breathing, nonlabored ventilation, respiratory function stable and patient connected to nasal cannula oxygen Cardiovascular status: stable and blood pressure returned to baseline Postop Assessment: no apparent nausea or vomiting Anesthetic complications: no  No notable events documented.  Last Vitals:  Vitals:   11/30/22 1315 11/30/22 1325  BP: (!) 105/56 (!) 114/55  Pulse: 64 60  Resp: 17 (!) 24  Temp: (!) 36.2 C   SpO2: 96% 97%    Last Pain:  Vitals:   11/30/22 1325  TempSrc:   PainSc: 0-No pain                 Effie Berkshire

## 2022-11-30 NOTE — Care Management Important Message (Signed)
Important Message  Patient Details  Name: Jennings Makris MRN: [id number] Date of Birth: 1929-02-07   Medicare Important Message Given:  Yes     Shelda Altes 11/30/2022, 9:36 AM

## 2022-11-30 NOTE — Op Note (Addendum)
[facility] Patient Name: Steven Rosario Procedure Date : 11/30/2022 MRN: [id number] Attending MD: Georgian Co , , [id number] Date of Birth: 1929/01/14 CSN: [id number] Age: 89 Admit Type: Inpatient Procedure:                Upper GI endoscopy Indications:              Iron deficiency anemia Providers:                Adline Mango" Georga Hacking, RN,                            William Dalton, Technician Referring MD:             Hospitalist team Medicines:                Monitored Anesthesia Care Complications:            No immediate complications. Estimated Blood Loss:     Estimated blood loss: none. Procedure:                Pre-Anesthesia Assessment:                           - Prior to the procedure, a History and Physical                            was performed, and patient medications and                            allergies were reviewed. The patient's tolerance of                            previous anesthesia was also reviewed. The risks                            and benefits of the procedure and the sedation                            options and risks were discussed with the patient.                            All questions were answered, and informed consent                            was obtained. Prior Anticoagulants: The patient has                            taken Eliquis (apixaban), last dose was 2 days                            prior to procedure. ASA Grade Assessment: III - A                            patient with severe systemic disease. After  reviewing the risks and benefits, the patient was                            deemed in satisfactory condition to undergo the                            procedure.                           After obtaining informed consent, the endoscope was                            passed under direct vision. Throughout the                            procedure, the patient's  blood pressure, pulse, and                            oxygen saturations were monitored continuously. The                            [id number] Olympus endoscope was introduced                            through the mouth, and advanced to the second part                            of duodenum. Scope In: Scope Out: Findings:      The examined esophagus was normal.      A single small sessile polyp with no bleeding and no stigmata of recent       bleeding was found at the incisura.      Localized inflammation characterized by congestion (edema), erosions and       erythema was found in the gastric antrum.      The examined duodenum was normal. Impression:               - Normal esophagus.                           - A single gastric polyp.                           - Gastritis.                           - Normal examined duodenum.                           - No specimens collected. Recommendation:           - Return patient to hospital ward for ongoing care.                           - Use a proton pump inhibitor PO BID for 8 weeks.                           -  No ibuprofen, naproxen, or other non-steroidal                            anti-inflammatory drugs.                           - Recommend iron supplementation.                           - Plan for colonoscopy tomorrow for further                            evaluation.                           - The findings and recommendations were discussed                            with the patient. Procedure Code(s):        --- Professional ---                           [phone], Esophagogastroduodenoscopy, flexible,                            transoral; diagnostic, including collection of                            specimen(s) by brushing or washing, when performed                            (separate procedure) Diagnosis Code(s):        --- Professional ---                           K31.7, Polyp of stomach and duodenum                            K29.70, Gastritis, unspecified, without bleeding                           D50.9, Iron deficiency anemia, unspecified CPT copyright 2022 American Medical Association. All rights reserved. The codes documented in this report are preliminary and upon coder review may  be revised to meet current compliance requirements. Dr Georgian Co "[name]" [name],  11/30/2022 1:33:05 PM Number of Addenda: 0

## 2022-11-30 NOTE — Anesthesia Procedure Notes (Signed)
Procedure Name: MAC Date/Time: 11/30/2022 1:08 PM  Performed by: Lavell Luster, CRNAPre-anesthesia Checklist: Patient identified, Emergency Drugs available, Suction available, Patient being monitored and Timeout performed Patient Re-evaluated:Patient Re-evaluated prior to induction Oxygen Delivery Method: Nasal cannula Preoxygenation: Pre-oxygenation with 100% oxygen Induction Type: IV induction Placement Confirmation: breath sounds checked- equal and bilateral and positive ETCO2 Dental Injury: Teeth and Oropharynx as per pre-operative assessment

## 2022-12-01 ENCOUNTER — Inpatient Hospital Stay (HOSPITAL_COMMUNITY): Payer: Medicare HMO | Admitting: Anesthesiology

## 2022-12-01 ENCOUNTER — Encounter (HOSPITAL_COMMUNITY)
Admission: EM | Disposition: A | Discharge status: Home Health | Payer: Self-pay | Source: Home / Self Care | Attending: Internal Medicine

## 2022-12-01 ENCOUNTER — Encounter (HOSPITAL_COMMUNITY): Payer: Self-pay | Admitting: Internal Medicine

## 2022-12-01 DIAGNOSIS — D126 Benign neoplasm of colon, unspecified: Secondary | ICD-10-CM | POA: Diagnosis not present

## 2022-12-01 DIAGNOSIS — I5021 Acute systolic (congestive) heart failure: Secondary | ICD-10-CM | POA: Diagnosis not present

## 2022-12-01 DIAGNOSIS — D509 Iron deficiency anemia, unspecified: Secondary | ICD-10-CM | POA: Diagnosis not present

## 2022-12-01 DIAGNOSIS — J90 Pleural effusion, not elsewhere classified: Secondary | ICD-10-CM | POA: Diagnosis not present

## 2022-12-01 DIAGNOSIS — D374 Neoplasm of uncertain behavior of colon: Secondary | ICD-10-CM | POA: Diagnosis not present

## 2022-12-01 HISTORY — PX: POLYPECTOMY: SHX5525

## 2022-12-01 HISTORY — PX: COLONOSCOPY WITH PROPOFOL: SHX5780

## 2022-12-01 HISTORY — PX: HEMOSTASIS CLIP PLACEMENT: SHX6857

## 2022-12-01 LAB — RENAL FUNCTION PANEL
Albumin: 2.7 g/dL — ABNORMAL LOW (ref 3.5–5.0)
Anion gap: 9 (ref 5–15)
BUN: 49 mg/dL — ABNORMAL HIGH (ref 8–23)
CO2: 23 mmol/L (ref 22–32)
Calcium: 8.2 mg/dL — ABNORMAL LOW (ref 8.9–10.3)
Chloride: 110 mmol/L (ref 98–111)
Creatinine, Ser: 3.97 mg/dL — ABNORMAL HIGH (ref 0.61–1.24)
GFR, Estimated: 14 mL/min — ABNORMAL LOW (ref >60)
Glucose, Bld: 89 mg/dL (ref 70–99)
Phosphorus: 4 mg/dL (ref 2.5–4.6)
Potassium: 3.9 mmol/L (ref 3.5–5.1)
Sodium: 142 mmol/L (ref 135–145)

## 2022-12-01 LAB — CBC
HCT: 25.4 % — ABNORMAL LOW (ref 39.0–52.0)
Hemoglobin: 7.9 g/dL — ABNORMAL LOW (ref 13.0–17.0)
MCH: 25.4 pg — ABNORMAL LOW (ref 26.0–34.0)
MCHC: 31.1 g/dL (ref 30.0–36.0)
MCV: 81.7 fL (ref 80.0–100.0)
Platelets: 170 10*3/uL (ref 150–400)
RBC: 3.11 MIL/uL — ABNORMAL LOW (ref 4.22–5.81)
RDW: 17 % — ABNORMAL HIGH (ref 11.5–15.5)
WBC: 5.1 10*3/uL (ref 4.0–10.5)
nRBC: 0 % (ref 0.0–0.2)

## 2022-12-01 LAB — ERYTHROPOIETIN: Erythropoietin: 46.8 m[IU]/mL — ABNORMAL HIGH (ref 2.6–18.5)

## 2022-12-01 SURGERY — COLONOSCOPY WITH PROPOFOL
Anesthesia: Monitor Anesthesia Care

## 2022-12-01 MED ORDER — LACTATED RINGERS IV SOLN: INTRAVENOUS | Status: DC | PRN

## 2022-12-01 MED ORDER — EPHEDRINE SULFATE-NACL 50-0.9 MG/10ML-% IV SOSY
PREFILLED_SYRINGE | INTRAVENOUS | Status: DC | PRN
  Administered 2022-12-01: 5 mg via INTRAVENOUS
  Administered 2022-12-01 (×2): 2.5 mg via INTRAVENOUS

## 2022-12-01 MED ORDER — LACTATED RINGERS IV SOLN
INTRAVENOUS | Status: AC | PRN
  Administered 2022-12-01: 20 mL/h via INTRAVENOUS

## 2022-12-01 MED ORDER — PHENYLEPHRINE 80 MCG/ML (10ML) SYRINGE FOR IV PUSH (FOR BLOOD PRESSURE SUPPORT)
PREFILLED_SYRINGE | INTRAVENOUS | Status: DC | PRN
  Administered 2022-12-01 (×4): 160 ug via INTRAVENOUS
  Administered 2022-12-01: 240 ug via INTRAVENOUS
  Administered 2022-12-01: 160 ug via INTRAVENOUS
  Administered 2022-12-01: 80 ug via INTRAVENOUS

## 2022-12-01 MED ORDER — PROPOFOL 500 MG/50ML IV EMUL
INTRAVENOUS | Status: DC | PRN
  Administered 2022-12-01: 100 ug/kg/min via INTRAVENOUS

## 2022-12-01 MED ORDER — FUROSEMIDE 40 MG PO TABS: 40.0000 mg | ORAL_TABLET | Freq: Two times a day (BID) | ORAL | Status: DC

## 2022-12-01 MED ORDER — PROPOFOL 10 MG/ML IV BOLUS
INTRAVENOUS | Status: DC | PRN
  Administered 2022-12-01: 20 mg via INTRAVENOUS

## 2022-12-01 MED ORDER — AMLODIPINE BESYLATE 5 MG PO TABS
5.0000 mg | ORAL_TABLET | Freq: Every day | ORAL | Status: DC
  Administered 2022-12-02 – 2022-12-04 (×3): 5 mg via ORAL
  Filled 2022-12-01 (×3): qty 1

## 2022-12-01 SURGICAL SUPPLY — 22 items

## 2022-12-01 NOTE — Anesthesia Postprocedure Evaluation (Signed)
Anesthesia Post Note  Patient: Steven Rosario  Procedure(s) Performed: COLONOSCOPY WITH PROPOFOL POLYPECTOMY HEMOSTASIS CLIP PLACEMENT     Patient location during evaluation: Endoscopy Anesthesia Type: MAC Level of consciousness: awake Pain management: pain level controlled Vital Signs Assessment: post-procedure vital signs reviewed and stable Respiratory status: spontaneous breathing, nonlabored ventilation and respiratory function stable Cardiovascular status: blood pressure returned to baseline and stable Postop Assessment: no apparent nausea or vomiting Anesthetic complications: no   No notable events documented.  Last Vitals:  Vitals:   12/01/22 1104 12/01/22 1209  BP: (!) 146/66 (!) 143/60  Pulse:  63  Resp:  18  Temp:  36.4 C  SpO2:  97%    Last Pain:  Vitals:   12/01/22 1209  TempSrc: Oral  PainSc: 0-No pain                 Aidyn Kellis P Shakora Nordquist

## 2022-12-01 NOTE — Progress Notes (Signed)
[name] NEPHROLOGY PROGRESS NOTE  Assessment/ Plan: Pt is a 89 y.o. yo male  with history of hypertension, nephrolithiasis, CAD, pacemaker, gout, CKD stage IV with baseline creatinine level seems to be around 3.1-3.3 (f.b Dr. Candiss Norse at [facility]), CHF presented with dyspnea on exertion and fatigue seen as a consultation for AKI on CKD.   # Acute on chronic combined systolic and diastolic CHF: Echo with EF of 30 to 35% and grade 2 diastolic dysfunction.  He was taking less amount of Lasix which might be causing the acute exacerbation.  He was treated with IV Lasix with significant clinical improvement.  He is euvolemic now.  Resume home dose of Lasix from tomorrow.   # Acute kidney injury on CKD stage IV, baseline creatinine level around 3.1-3.3 and follows with Dr. Candiss Norse at [facility].  AKI presumably due to CHF exacerbation versus this might be his new baseline (progressive CKD) after maintaining euvolemia.  Fortunately, he has no signs or symptoms of uremia and volume looks acceptable.  I will resume furosemide 40 mg twice a day starting from tomorrow.  No need for dialysis.   # Anemia multifactorial etiology including CKD, possible GI bleed and iron deficiency: Receiving iron.  Status post upper GI endoscopy, on PPI and plan for colonoscopy today.  Received a dose of Aranesp on 3/1.   # Hypertension/volume: Continue current antihypertensive/cardiac medication.  Diuretics as discussed above.  Sign off, please call us back with question.  The patient will follow with Dr. Candiss Norse at [facility].  Subjective: Seen and examined at bedside.  Urine output is not measured.  He denies nausea, vomiting, chest pain, shortness of breath.  Plan for colonoscopy today.  No major health event overnight. Objective Vital signs in last 24 hours: Vitals:   12/01/22 0000 12/01/22 0400 12/01/22 0422 12/01/22 0909  BP: 132/62 129/63  (!) 157/70  Pulse: 63 60  64  Resp: '19  17  17  '$ Temp: 97.8 F (36.6 C) 97.7 F (36.5 C)  97.6 F (36.4 C)  TempSrc: Oral Oral  Temporal  SpO2:  93%  97%  Weight: 79 kg  83.1 kg   Height:       Weight change: -0.092 kg  Intake/Output Summary (Last 24 hours) at 12/01/2022 0956 Last data filed at 11/30/2022 1542 Gross per 24 hour  Intake 820 ml  Output --  Net 820 ml       Labs: RENAL PANEL Recent Labs  Lab 11/28/22 1105 11/29/22 0042 11/29/22 1233 11/30/22 0127 12/01/22 0133  NA 143 141 141 140 142  K 3.5 3.9 3.9 4.1 3.9  CL 111 110 107 106 110  CO2 22 20* '24 22 23  '$ GLUCOSE 109* 131* 147* 115* 89  BUN 55* 52* 53* 57* 49*  CREATININE 4.19* 4.32* 4.32* 4.47* 3.97*  CALCIUM 8.5* 8.1* 8.5* 8.5* 8.2*  PHOS  --  4.0  --   --  4.0  ALBUMIN  --  3.0*  --   --  2.7*    Liver Function Tests: Recent Labs  Lab 11/29/22 0042 12/01/22 0133  ALBUMIN 3.0* 2.7*   No results for input(s): "LIPASE", "AMYLASE" in the last 168 hours. No results for input(s): "AMMONIA" in the last 168 hours. CBC: Recent Labs    11/02/22 0222 11/28/22 1105 11/29/22 0042 11/30/22 0127 12/01/22 0133  HGB 10.3* 8.6* 7.7* 8.5* 7.9*  MCV 82.8 82.6 84.0 82.7 81.7  VITAMINB12  --   --   --  703  --   FOLATE  --   --   --  12.6  --   FERRITIN  --   --   --  39  --   TIBC  --   --   --  326  --   IRON  --   --   --  229*  --   RETICCTPCT  --   --   --  1.8  --     Cardiac Enzymes: No results for input(s): "CKTOTAL", "CKMB", "CKMBINDEX", "TROPONINI" in the last 168 hours. CBG: Recent Labs  Lab 11/29/22 0736 11/30/22 0818  GLUCAP 91 107*    Iron Studies:  Recent Labs    11/30/22 0127  IRON 229*  TIBC 326  FERRITIN 39   Studies/Results: No results found.  Medications: Infusions:  sodium chloride 20 mL/hr at 11/30/22 2311   [MAR Hold] ferric gluconate (FERRLECIT) IVPB 125 mg (11/30/22 0823)   lactated ringers 20 mL/hr (12/01/22 0919)    Scheduled Medications:  [MAR Hold] amiodarone  200 mg Oral Daily   [MAR Hold]  atorvastatin  20 mg Oral Daily   [MAR Hold] carvedilol  6.25 mg Oral BID   [MAR Hold] cyanocobalamin  1,000 mcg Oral Daily   [MAR Hold] hydrALAZINE  37.5 mg Oral TID   [MAR Hold] isosorbide dinitrate  20 mg Oral TID   [MAR Hold] pantoprazole  40 mg Oral BID AC    have reviewed scheduled and prn medications.  Physical Exam: General:NAD, comfortable Heart:RRR, s1s2 nl Lungs:clear b/l, no crackle Abdomen:soft, Non-tender, non-distended Extremities:No edema Neurology: Alert, awake and following commands  Steven Rosario 12/01/2022,9:56 AM  LOS: 3 days

## 2022-12-01 NOTE — Anesthesia Preprocedure Evaluation (Signed)
Anesthesia Evaluation    Reviewed: Allergy & Precautions, Patient's Chart, lab work & pertinent test results  Airway Mallampati: II  TM Distance: >3 FB Neck ROM: Full    Dental no notable dental hx.    Pulmonary neg pulmonary ROS   Pulmonary exam normal        Cardiovascular hypertension, Pt. on medications and Pt. on home beta blockers + CAD, + Cardiac Stents and +CHF  Normal cardiovascular exam+ pacemaker   ECHO: 1. Left ventricular ejection fraction, by estimation, is 30 to 35%   Neuro/Psych negative neurological ROS     GI/Hepatic Neg liver ROS,GERD  Medicated and Controlled,,  Endo/Other  negative endocrine ROS    Renal/GU CRFRenal disease     Musculoskeletal  (+) Arthritis ,    Abdominal   Peds  Hematology  (+) Blood dyscrasia, anemia   Anesthesia Other Findings IDA  Reproductive/Obstetrics                             Anesthesia Physical Anesthesia Plan  ASA: 4  Anesthesia Plan: MAC   Post-op Pain Management:    Induction: Intravenous  PONV Risk Score and Plan: 1 and Propofol infusion and Treatment may vary due to age or medical condition  Airway Management Planned: Simple Face Mask  Additional Equipment:   Intra-op Plan:   Post-operative Plan:   Informed Consent: I have reviewed the patients History and Physical, chart, labs and discussed the procedure including the risks, benefits and alternatives for the proposed anesthesia with the patient or authorized representative who has indicated his/her understanding and acceptance.   Patient has DNR.  Discussed DNR with patient and Suspend DNR.   Dental advisory given  Plan Discussed with: CRNA  Anesthesia Plan Comments:         Anesthesia Quick Evaluation

## 2022-12-01 NOTE — Op Note (Signed)
[facility] Patient Name: Steven Rosario Procedure Date : 12/01/2022 MRN: [id number] Attending MD: Carol Ada , MD, [id number] Date of Birth: 05/01/1929 CSN: [id number] Age: 89 Admit Type: Inpatient Procedure:                Colonoscopy Indications:              Iron deficiency anemia Providers:                Carol Ada, MD, Dulcy Fanny, William Dalton, Technician Referring MD:              Medicines:                 Complications:            No immediate complications. Estimated Blood Loss:     Estimated blood loss was minimal. Procedure:                Pre-Anesthesia Assessment:                           - Prior to the procedure, a History and Physical                            was performed, and patient medications and                            allergies were reviewed. The patient's tolerance of                            previous anesthesia was also reviewed. The risks                            and benefits of the procedure and the sedation                            options and risks were discussed with the patient.                            All questions were answered, and informed consent                            was obtained. Prior Anticoagulants: The patient has                            taken no anticoagulant or antiplatelet agents. ASA                            Grade Assessment: III - A patient with severe                            systemic disease. After reviewing the risks and                            benefits, the  patient was deemed in satisfactory                            condition to undergo the procedure.                           - Sedation was administered by an anesthesia                            professional. Deep sedation was attained.                           After obtaining informed consent, the colonoscope                            was passed under direct vision. Throughout the                             procedure, the patient's blood pressure, pulse, and                            oxygen saturations were monitored continuously. The                            [id number] Olympus coloscope was                            introduced through the anus and advanced to the the                            cecum, identified by appendiceal orifice and                            ileocecal valve. The colonoscopy was performed with                            difficulty due to poor bowel prep. Successful                            completion of the procedure was aided by using                            manual pressure, straightening and shortening the                            scope to obtain bowel loop reduction and lavage.                            The patient tolerated the procedure well. The                            quality of the bowel preparation was evaluated  using the BBPS [facility] Bowel Preparation Scale)                            with scores of: Right Colon = 2 (minor amount of                            residual staining, small fragments of stool and/or                            opaque liquid, but mucosa seen well), Transverse                            Colon = 2 (minor amount of residual staining, small                            fragments of stool and/or opaque liquid, but mucosa                            seen well) and Left Colon = 2 (minor amount of                            residual staining, small fragments of stool and/or                            opaque liquid, but mucosa seen well). The total                            BBPS score equals 6. Adequate. The ileocecal valve,                            appendiceal orifice, and rectum were photographed. Scope In: 9:33:22 AM Scope Out: 10:17:46 AM Scope Withdrawal Time: 0 hours 31 minutes 15 seconds  Total Procedure Duration: 0 hours 44 minutes 24 seconds  Findings:      19-20 sessile  polyps were found in the sigmoid colon, descending colon,       ascending colon and cecum. The polyps were 3 to 20 mm in size. These       polyps were removed with a cold snare. Resection and retrieval were       complete. To prevent bleeding post-intervention, seven hemostatic clips       were successfully placed (MR safe). Clip manufacturer: [employer]. There was no bleeding at the end of the procedure.      The sigmoid colon polyp was removed with a hot snare. Two larger sessile       polyps from the descending colon were removed with a cold snare in       piecemeal fashion. All the edges were clear, but with the wide resection       hemoclips were placed to reduce the risk of bleeding. A total of 7       hemoclips were placed. Impression:               - 19-20 3 to 20 mm polyps in the sigmoid colon, in  the descending colon, in the ascending colon and in                            the cecum, removed with a cold snare. Resected and                            retrieved. Clip manufacturer: [employer].                            Clips (MR safe) were placed. Recommendation:           - Return patient to hospital ward for ongoing care.                           - Resume regular diet.                           - Continue present medications.                           - Await pathology results.                           - Repeat colonoscopy is not recommended for                            surveillance with his age and severe comorbidities. Procedure Code(s):        --- Professional ---                           [id number], Colonoscopy, flexible; with removal of                            tumor(s), polyp(s), or other lesion(s) by snare                            technique Diagnosis Code(s):        --- Professional ---                           D12.5, Benign neoplasm of sigmoid colon                           D12.4, Benign neoplasm of descending colon                            D12.2, Benign neoplasm of ascending colon                           D12.0, Benign neoplasm of cecum                           D50.9, Iron deficiency anemia, unspecified CPT copyright 2022 American Medical Association. All rights reserved. The codes documented in this report are preliminary and upon coder review may  be revised to meet current compliance requirements. Carol Ada, MD Carol Ada, MD 12/01/2022  10:28:23 AM This report has been signed electronically. Number of Addenda: 0

## 2022-12-01 NOTE — Progress Notes (Signed)
Occupational Therapy Treatment Patient Details Name: Steven Rosario MRN: [id number] DOB: 11/28/1928 Today's Date: 12/01/2022   History of present illness Pt is a 89 y/o M presenting to ED on 2/28 with SOB, admitted for acute combined systolic and diastolic heart failure. PMH includes HTN, HLD, CKD IV, CAD, arrhythmia s/p pacemaker 2018.   OT comments  Pt. Seen for skilled OT treatment.  Requests to stay in bed secondary to reports of feeling "weak" from NPO status.  Focus of session was energy conservation strategies and CHF management strategies.  Pt. Receptive to education and was able to verbalize understanding and provide examples of ways to incorporate into adls.  Pt. Is very active and independent stating he does a lot of work around his house and loves the outdoors.  Will continue progressing adls next session as pt. Able.  Agree with current d/c recommendations.     Recommendations for follow up therapy are one component of a multi-disciplinary discharge planning process, led by the attending physician.  Recommendations may be updated based on patient status, additional functional criteria and insurance authorization.    Follow Up Recommendations  Home health OT     Assistance Recommended at Discharge Set up Supervision/Assistance  Patient can return home with the following  A little help with bathing/dressing/bathroom;Assistance with cooking/housework;Assist for transportation;Help with stairs or ramp for entrance   Equipment Recommendations  None recommended by OT    Recommendations for Other Services      Precautions / Restrictions Precautions Precautions: Fall       Mobility Bed Mobility                    Transfers                         Balance                                           ADL either performed or assessed with clinical judgement   ADL                                         General ADL  Comments: pt. requested bed level secondary to feeling week due to npo for 2 days due to procedures.  focused on energy conservation strategies and CHF management strategies.  pt. able to verbalize understanding for energy conservation along with importance of chf management.  recalled daily weights and why they are important along with importance of notifiying his md if notable changes in weight.  did disclose that he often gets to completing an outdoor task and will get away from his rw and have to either crawl to get it or wait until someone comes to help him.  reviewed ways to avoid that from occuring.    Extremity/Trunk Assessment              Vision       Perception     Praxis      Cognition Arousal/Alertness: Awake/alert Behavior During Therapy: WFL for tasks assessed/performed Overall Cognitive Status: Within Functional Limits for tasks assessed  Exercises      Shoulder Instructions       General Comments      Pertinent Vitals/ Pain       Pain Assessment Pain Assessment: No/denies pain  Home Living                                          Prior Functioning/Environment              Frequency  Min 2X/week        Progress Toward Goals  OT Goals(current goals can now be found in the care plan section)  Progress towards OT goals: Progressing toward goals     Plan Discharge plan remains appropriate    Co-evaluation                 AM-PAC OT "6 Clicks" Daily Activity     Outcome Measure   Help from another person eating meals?: None Help from another person taking care of personal grooming?: A Little Help from another person toileting, which includes using toliet, bedpan, or urinal?: A Little Help from another person bathing (including washing, rinsing, drying)?: A Little Help from another person to put on and taking off regular upper body clothing?: A Little Help  from another person to put on and taking off regular lower body clothing?: A Little 6 Click Score: 19    End of Session    OT Visit Diagnosis: Unsteadiness on feet (R26.81);Other abnormalities of gait and mobility (R26.89);Muscle weakness (generalized) (M62.81);History of falling (Z91.81)   Activity Tolerance Patient tolerated treatment well   Patient Left in bed;Other (comment) (with staff coming to take him for a procedure)   Nurse Communication          Time: [phone] OT Time Calculation (min): 29 min  Charges: OT General Charges $OT Visit: 1 Visit OT Treatments $Self Care/Home Management : 8-22 mins  Sonia Baller, COTA/L Acute Rehabilitation [phone]   Clearnce Sorrel Lorraine-COTA/L 12/01/2022, 9:58 AM

## 2022-12-01 NOTE — Interval H&P Note (Signed)
History and Physical Interval Note:  12/01/2022 9:23 AM  Steven Rosario  has presented today for surgery, with the diagnosis of IDA.  The various methods of treatment have been discussed with the patient and family. After consideration of risks, benefits and other options for treatment, the patient has consented to  Procedure(s): COLONOSCOPY WITH PROPOFOL (N/A) as a surgical intervention.  The patient's history has been reviewed, patient examined, no change in status, stable for surgery.  I have reviewed the patient's chart and labs.  Questions were answered to the patient's satisfaction.     Khali Perella D

## 2022-12-01 NOTE — Transfer of Care (Signed)
Immediate Anesthesia Transfer of Care Note  Patient: Steven Rosario  Procedure(s) Performed: COLONOSCOPY WITH PROPOFOL POLYPECTOMY HEMOSTASIS CLIP PLACEMENT  Patient Location: PACU  Anesthesia Type:MAC  Level of Consciousness: drowsy and patient cooperative  Airway & Oxygen Therapy: Patient Spontanous Breathing  Post-op Assessment: Report given to RN and Post -op Vital signs reviewed and stable  Post vital signs: Reviewed and stable  Last Vitals:  Vitals Value Taken Time  BP 146/62 12/01/22 1033  Temp    Pulse 63 12/01/22 1037  Resp 19 12/01/22 1037  SpO2 97 % 12/01/22 1037  Vitals shown include unvalidated device data.  Last Pain:  Vitals:   12/01/22 0909  TempSrc: Temporal  PainSc: 0-No pain         Complications: No notable events documented.

## 2022-12-01 NOTE — Progress Notes (Signed)
Subjective:  Steven Rosario reports feeling fine today, he would like a couple of ice chips to help wet his mouth. He is feeling a lot better than when he initially came in. Understands reason for colonoscopy today. He is appreciative of the care he is receiving while here.    Objective: Vitals over previous 24hr: Vitals:   12/01/22 1038 12/01/22 1040 12/01/22 1045 12/01/22 1104  BP:  (!) 145/63 (!) 143/59 (!) 146/66  Pulse: 63 60 64   Resp: 19 17 (!) 22   Temp:      TempSrc:      SpO2: 97% 97% 98%   Weight:      Height:       General: pleasant elderly male, laying comfortably in bed, NAD. CV: normal rate and regular rhythm, no m/r/g. No peripheral edema. Pulm: normal WOB on RA, CTABL. Abdomen: soft, nondistended, nontender to palpation, normoactive bowel sounds. Skin: warm and dry. Neuro: AAOx3, no focal deficits, moving all extremities spontaneously. Psych: normal mood and affect.   Assessment/Plan: Steven Rosario is a 89 year old male with a past medical history of hypertension, hyperlipidemia, pernicious anemia, CKD IV, CAD, CHF, and arrhythmia post pacemaker 2018 who presented with breath shortness and fatigue, now admitted for acute on chronic heart failure exacerbation.    ---Acute heart failure reduced ejection fraction exacerbation ---Chronic right-sided small-moderate pleural effusion Patient has history of heart failure reduced ejection fraction. Most recent echocardiogram performed 2-1 demonstrated EF 30-35, grade II diastolic dysfunction, and mild LA-RA dilation. Recently admitted in 10-2022 for acute exacerbation, discharged with home medications including furosemide and carvedilol. Estimated dry weight is 80kg. He presented on 2-28 with breath shortness and fatigue that started about two weeks after prior discharge. Upon arrival, patient was hemodynamically stable and without supplemental oxygen. Laboratory tests revealed elevated creatinine and BNP. Exam demonstrated  evidence of fluid overload. Chest radiograph notable for persistence of chronic right small-moderate pleural effusion. Presentation is consistent with acute heart failure exacerbation secondary to cardiorenal syndrome. Initially received IV lasix, but is now euvolemic. Per nephrology, plan to resume home lasix tomorrow. > Carvedilol 6.'125mg'$  q12 > Isosorbide dinitrate '20mg'$  q8 > Hydralazine 37.'5mg'$  q8 > Diet heart healthy > Monitor ins-outs > Trend BMP q24 > resume home lasix tomorrow   ---Normocytic iron-deficiency anemia  ---Positive fecal occult blood test  ---History of hemorrhoids  Patient has history of chronic anemia likely secondary to CKD IV and iron deficiency. Recent outpatient study revealed ferritin 47, iron 29, and saturation 9. Reports intermittent red-colored toilet paper and some straining with defecation. History also notable for possible melena on morning of admission. Upon arrival, FOBT positive and hemoglobin was 8.6 below previous baseline around 10. Hgb 8.5 > 7.7 > 8.5 >7.9, relatively stable. Intravenous pantoprazole and ferric gluconate started, last day of IV iron today. EGD revealed gastritis but no acute bleeding. Colonoscopy performed today revealed 19-20 polyps of varying size (3-69m) in the sigmoid, descending, and ascending colon which were removed via cold snare. > Appreciate Gastroenterology recommendations > Ferric gluconate '125mg'$  x3 (last dose today) > PO protonix '40mg'$  BID for 8 weeks > Trend CBC q24 > f/u pathology results  ---Acute kidney injury ---Chronic kidney disease IV Patient has history of chronic kidney disease with recent baseline creatinine around 3.0-3.3. [facility]. Upon arrival, creatinine elevated above baseline to 4.19. Exam notable for evidence of volume overload. Given concurrent heart failure, etiology likely prerenal secondary to cardiorenal syndrome. Diuresis with intravenous  furosemide started,  discontinued on 2-29 given euvolemic exam and worsening renal function. Had improvement in renal function after withholding diuresis yesterday, will monitor an additional day with plans to restart home lasix tomorrow per nephrology. > Appreciate nephrology recommendations > Trend BMP q24 > monitor I/Os > avoid nephrotoxic medications   ---Arrhythmia post pacemaker 2018 Patient has history of arrhythmia, atrioventricular pacemaker was placed in 2018. Currently takes amiodarone, aspirin, and apixaban. Upon arrival, electrocardiogram demonstrated sinus ventricular-paced rhythm unchanged from prior study. > Amiodarone '200mg'$  q24 > Hold apixaban given possible gastrointestinal bleed > Hold aspirin given possible gastrointestinal bleed   ---Coronary artery disease post stent ---Hyperlipidemia Patient has history of coronary artery disease post two LAD stents placed in 2012. He takes aspirin and atorvastatin at home. > Atorvastatin '20mg'$  q24 > Hold aspirin given possible gastrointestinal bleed   ---Hypertension Patient has history of hypertension managed at home with amlodipine. He also takes carvedilol, isosorbide-dinitrate, hydralazine, and furosemide. Will resume norvasc starting tomorrow. > Carvedilol 6.'125mg'$  q12 > Isosorbide dinitrate '20mg'$  q8 > Hydralazine 37.'5mg'$  q8 > resume norvasc starting tomorrow   ---Gastroesophageal reflux disease Patient has a history of gastroesophageal reflux disease managed with pantoprazole at home. Intravenous pantoprazole started at higher dose during current hospitalization given concern for possible gastrointestinal bleed. > PO protonix BID    ---Vitamin B12 and D deficiency Patient has history of vitamin B12 and D deficiency managed at home with respective supplements. > Cyanocobalamin 1000ug q24   Principal Problem:   Acute combined systolic and diastolic heart failure (HCC) Active Problems:   Occult GI bleeding   Renal dysfunction  Prior to  Admission Living Arrangement: home living alone Anticipated Discharge Location: home Barriers to Discharge: medical management Dispo: Anticipated discharge in approximately 1-2 day(s).   Virl Axe, MD Internal Medicine PGY-3 Pager [phone]  After 5pm on weekdays and 1pm on weekends: On Call pager [phone]

## 2022-12-02 LAB — RENAL FUNCTION PANEL
Albumin: 2.8 g/dL — ABNORMAL LOW (ref 3.5–5.0)
Anion gap: 10 (ref 5–15)
BUN: 43 mg/dL — ABNORMAL HIGH (ref 8–23)
CO2: 20 mmol/L — ABNORMAL LOW (ref 22–32)
Calcium: 8.2 mg/dL — ABNORMAL LOW (ref 8.9–10.3)
Chloride: 110 mmol/L (ref 98–111)
Creatinine, Ser: 3.46 mg/dL — ABNORMAL HIGH (ref 0.61–1.24)
GFR, Estimated: 16 mL/min — ABNORMAL LOW (ref >60)
Glucose, Bld: 117 mg/dL — ABNORMAL HIGH (ref 70–99)
Phosphorus: 3.3 mg/dL (ref 2.5–4.6)
Potassium: 3.5 mmol/L (ref 3.5–5.1)
Sodium: 140 mmol/L (ref 135–145)

## 2022-12-02 LAB — CBC
HCT: 26.6 % — ABNORMAL LOW (ref 39.0–52.0)
Hemoglobin: 8.3 g/dL — ABNORMAL LOW (ref 13.0–17.0)
MCH: 25.6 pg — ABNORMAL LOW (ref 26.0–34.0)
MCHC: 31.2 g/dL (ref 30.0–36.0)
MCV: 82.1 fL (ref 80.0–100.0)
Platelets: 175 10*3/uL (ref 150–400)
RBC: 3.24 MIL/uL — ABNORMAL LOW (ref 4.22–5.81)
RDW: 17.2 % — ABNORMAL HIGH (ref 11.5–15.5)
WBC: 8.1 10*3/uL (ref 4.0–10.5)
nRBC: 0 % (ref 0.0–0.2)

## 2022-12-02 LAB — GLUCOSE, CAPILLARY: Glucose-Capillary: 153 mg/dL — ABNORMAL HIGH (ref 70–99)

## 2022-12-02 MED ORDER — FUROSEMIDE 40 MG PO TABS
40.0000 mg | ORAL_TABLET | Freq: Two times a day (BID) | ORAL | Status: DC
  Administered 2022-12-02: 40 mg via ORAL
  Filled 2022-12-02: qty 1

## 2022-12-02 MED ORDER — FUROSEMIDE 40 MG PO TABS
40.0000 mg | ORAL_TABLET | Freq: Every day | ORAL | Status: DC
  Administered 2022-12-03 – 2022-12-04 (×2): 40 mg via ORAL
  Filled 2022-12-02 (×2): qty 1

## 2022-12-02 MED ORDER — APIXABAN 2.5 MG PO TABS
2.5000 mg | ORAL_TABLET | Freq: Two times a day (BID) | ORAL | Status: DC
  Administered 2022-12-02 – 2022-12-04 (×5): 2.5 mg via ORAL
  Filled 2022-12-02 (×5): qty 1

## 2022-12-02 MED ORDER — POTASSIUM CHLORIDE CRYS ER 20 MEQ PO TBCR
40.0000 meq | EXTENDED_RELEASE_TABLET | Freq: Once | ORAL | Status: AC
  Administered 2022-12-02: 40 meq via ORAL
  Filled 2022-12-02: qty 2

## 2022-12-02 NOTE — Progress Notes (Signed)
Mobility Specialist Progress Note:   12/02/22 1047  Mobility  Activity Ambulated with assistance in hallway  Level of Assistance Standby assist, set-up cues, supervision of patient - no hands on  Assistive Device Front wheel walker  Distance Ambulated (ft) 500 ft  Activity Response Tolerated well  $Mobility charge 1 Mobility   Pt in chair willing to participate in mobility. No complaints of pain. Left in chair with call bell in reach and all needs met.   Gareth Eagle Roshawna Colclasure Mobility Specialist Please contact via [employer] or  Rehab Office at [phone]

## 2022-12-02 NOTE — Plan of Care (Signed)

## 2022-12-03 ENCOUNTER — Encounter (HOSPITAL_COMMUNITY): Payer: Self-pay | Admitting: Internal Medicine

## 2022-12-03 DIAGNOSIS — N289 Disorder of kidney and ureter, unspecified: Secondary | ICD-10-CM | POA: Diagnosis not present

## 2022-12-03 DIAGNOSIS — R195 Other fecal abnormalities: Secondary | ICD-10-CM | POA: Diagnosis not present

## 2022-12-03 DIAGNOSIS — I5041 Acute combined systolic (congestive) and diastolic (congestive) heart failure: Secondary | ICD-10-CM | POA: Diagnosis not present

## 2022-12-03 DIAGNOSIS — N184 Chronic kidney disease, stage 4 (severe): Secondary | ICD-10-CM

## 2022-12-03 LAB — MAGNESIUM: Magnesium: 2.1 mg/dL (ref 1.7–2.4)

## 2022-12-03 LAB — RENAL FUNCTION PANEL
Albumin: 2.7 g/dL — ABNORMAL LOW (ref 3.5–5.0)
Anion gap: 7 (ref 5–15)
BUN: 44 mg/dL — ABNORMAL HIGH (ref 8–23)
CO2: 23 mmol/L (ref 22–32)
Calcium: 8.2 mg/dL — ABNORMAL LOW (ref 8.9–10.3)
Chloride: 108 mmol/L (ref 98–111)
Creatinine, Ser: 3.52 mg/dL — ABNORMAL HIGH (ref 0.61–1.24)
GFR, Estimated: 16 mL/min — ABNORMAL LOW (ref >60)
Glucose, Bld: 117 mg/dL — ABNORMAL HIGH (ref 70–99)
Phosphorus: 2.9 mg/dL (ref 2.5–4.6)
Potassium: 4 mmol/L (ref 3.5–5.1)
Sodium: 138 mmol/L (ref 135–145)

## 2022-12-03 LAB — CBC
HCT: 25.1 % — ABNORMAL LOW (ref 39.0–52.0)
Hemoglobin: 7.6 g/dL — ABNORMAL LOW (ref 13.0–17.0)
MCH: 25.4 pg — ABNORMAL LOW (ref 26.0–34.0)
MCHC: 30.3 g/dL (ref 30.0–36.0)
MCV: 83.9 fL (ref 80.0–100.0)
Platelets: 181 10*3/uL (ref 150–400)
RBC: 2.99 MIL/uL — ABNORMAL LOW (ref 4.22–5.81)
RDW: 17 % — ABNORMAL HIGH (ref 11.5–15.5)
WBC: 6 10*3/uL (ref 4.0–10.5)
nRBC: 0 % (ref 0.0–0.2)

## 2022-12-03 MED ORDER — FUROSEMIDE 40 MG PO TABS
40.0000 mg | ORAL_TABLET | Freq: Once | ORAL | Status: AC
  Administered 2022-12-03: 40 mg via ORAL
  Filled 2022-12-03: qty 1

## 2022-12-03 NOTE — Progress Notes (Signed)
Pt. Placed on 2 liters due to SpO2 84% while asleep.

## 2022-12-03 NOTE — Care Management Important Message (Signed)
Important Message  Patient Details  Name: Steven Rosario MRN: [id number] Date of Birth: 1929-07-20   Medicare Important Message Given:  Yes     Hannah Beat 12/03/2022, 12:09 PM

## 2022-12-03 NOTE — Progress Notes (Signed)
Subjective:  Steven Rosario was feeling fine this morning. Inquired about why he needed oxygen overnight. Discussed possible sleep apnea and considering outpatient sleep study. Answered his questions about weight gain and provided clear goals to follow upon returning home.     Objective: Vitals over previous 24hr: Vitals:   12/03/22 0521 12/03/22 0747 12/03/22 0902 12/03/22 1139  BP: 133/61  (!) 157/64 (!) 140/66  Pulse: 60  60 60  Resp: '18  18 18  '$ Temp: 97.6 F (36.4 C)  97.6 F (36.4 C) 97.6 F (36.4 C)  TempSrc: Oral  Oral Oral  SpO2: 94% 98% 100% 96%  Weight:      Height:        General:                       awake and alert, lying comfortably in bed eating breakfast, cooperative and pleasant, not in acute distress Lungs:                          normal respiratory effort, breathing unlabored, symmetrical chest rise, no crackles or wheezing Cardiac:                        regular rate and rhythm, normal S1 and S2, trace pitting edema Abdomen:                     soft and non-distended, normoactive bowel sounds present in all four quadrants, no tenderness to palpation or guarding Neurologic:                   oriented to person-place-time, moving all extremities, no gross focal deficits Psychiatric:                   euthymic mood with congruent affect, intelligible speech     Assessment/Plan: Steven Outwater is a 89 year old male with a past medical history of hypertension, hyperlipidemia, pernicious anemia, CKD IV, CAD, CHF, and arrhythmia post pacemaker 2018 who presented with breath shortness and fatigue, now admitted for acute on chronic heart failure exacerbation secondary to cardiorenal syndrome.       ---Acute heart failure reduced ejection fraction exacerbation ---Chronic right-sided small-moderate pleural effusion Patient has history of heart failure reduced ejection fraction. Most recent echocardiogram performed 2-1 demonstrated EF 30-35, grade II diastolic  dysfunction, and mild LA-RA dilation. Recently admitted in 10-2022 for acute exacerbation, discharged with home medications including furosemide and carvedilol. Dry weight at that time was 80kg. Describes sometimes skipping his afternoon dose of the furosemide. He presented on 2-28 with breath shortness and fatigue that started about two weeks after prior discharge. Laboratory tests revealed elevated creatinine and BNP. Exam demonstrated evidence of fluid overload. Presentation is consistent with acute heart failure exacerbation secondary to cardiorenal syndrome. Two doses of intravenous furosemide '60mg'$  precipitated net fluid loss 3.3L and patient now appears euvolemic on exam. Since transitioning to oral furosemide '40mg'$  daily on 3-3, patient has gained about 0.7kg. Primary team will administer an additional furosemide '40mg'$  dose today 3-4 and closely monitor volume status to finalize home regimen upon discharge. > Furosemide '40mg'$  q24 + '40mg'$  x1 on 3-4 > Carvedilol 6.'125mg'$  q12 > Isosorbide dinitrate '20mg'$  q8 > Hydralazine 37.'5mg'$  q8 > Diet heart healthy > Monitor ins-outs > Daily standing weights > Trend BMP q24     ---Normocytic iron-deficiency anemia  ---Positive fecal occult blood test  ---  History of hemorrhoids  Patient has history of chronic anemia likely secondary to CKD IV and iron deficiency. Recent outpatient study revealed ferritin 47, iron 29, and saturation 9. Reports intermittent red-colored toilet paper and some straining with defecation. History also notable for possible melena on morning of admission. Upon arrival, FOBT positive and hemoglobin was 8.6 below previous baseline around 10. Hemoglobin dropped to 7.7 on hospitalization day two, raising concern for acute bleed, but has since remained stable around 8.0. Intravenous pantoprazole and ferric gluconate started to correct calculated iron deficit. Upper endoscopy demonstrated gastritis without acute bleeding. Colonoscopy revealed 19-20  polyps all <65m that were removed via cold snare, biopsy results pending. > Gastroenterology consult, appreciate recommendations > Pantoprazole '40mg'$  q12, last dose 4-29 > Trend CBC q24 > Follow pathology results > Hold aspirin given multiple colonic polyps > Consider iron supplementation upon discharge   ---Acute kidney injury ---Chronic kidney disease IV Patient has history of chronic kidney disease with recent baseline creatinine around 3.0-3.3. [facility]. Upon arrival, creatinine elevated above baseline to 4.19. Exam notable for evidence of volume overload. Given concurrent heart failure, etiology likely prerenal secondary to cardiorenal syndrome. Diuresis with intravenous furosemide started, discontinued on 2-29 given euvolemic exam and worsening renal function. Since starting oral furosemide regimen at anticipated home dosing regimen, renal function has remained stable. > Consult nephrology, appreciate recommendations > Monitor ins-outs > Daily standing weights > Trend BMP q24     ---Transient nocturnal hypoxia Overnight on 3-4, patient developed hypoxia with SpO2 down to 84% and was subsequently placed on 2L/min supplemental oxygen. His SpO2 improved thereafter and has remained within normal limits during daytime hours. Patient may have underlying sleep apnea and further outpatient diagnostic workup is warranted. > Consider sleep study as outpatient   ---Arrhythmia post pacemaker 2018 Patient has history of arrhythmia, atrioventricular pacemaker was placed in 2018. Currently takes amiodarone, aspirin, and apixaban. Upon arrival, electrocardiogram demonstrated sinus ventricular-paced rhythm unchanged from prior study. Home apixaban was resumed on 3-3, aspirin remains withheld given his higher risk of bleeding with multiple colonic polyps. > Amiodarone '200mg'$  q24 > Apixaban 2.'5mg'$  q12 > Hold aspirin given multiple colonic polyps      ---Coronary artery disease post stent ---Hyperlipidemia Patient has history of coronary artery disease post two LAD stents placed in 2012. He takes aspirin and atorvastatin at home. > Atorvastatin '20mg'$  q24 > Hold aspirin given multiple colonic polyps     ---Hypertension Patient has history of hypertension managed at home with amlodipine. He also regularly takes carvedilol, isosorbide-dinitrate, hydralazine, and furosemide. Blood pressure has remained with normal or slightly-elevated range since admission, home amlodipine was resumed on 3-3. > Carvedilol 6.'125mg'$  q12 > Isosorbide dinitrate '20mg'$  q8 > Hydralazine 37.'5mg'$  q8 > Amlodipine '5mg'$  q24     ---Gastroesophageal reflux disease Patient has a history of gastroesophageal reflux disease managed with pantoprazole at home. Intravenous pantoprazole started at higher dose during current hospitalization. Aspirin has been discontinued given high bleeding risk in setting of multiple colonic polyps. > Pantoprazole '40mg'$  q12, last dose 4-29 > Hold aspirin given multiple colonic polyps     ---Vitamin B12 and D deficiency Patient has history of vitamin B12 and D deficiency managed at home with respective supplements. > Cyanocobalamin 1000ug q24     Principal Problem:   Acute combined systolic and diastolic heart failure (HCC) Active Problems:   Occult GI bleeding   Renal dysfunction   [place]ronic kidney disease (CKD), [place]  Prior to Admission Living Arrangement: home living alone Anticipated Discharge Location: home Barriers to Discharge: medical management Dispo: Anticipated discharge in approximately 1 day(s).    Roswell Nickel, MD Internal Medicine PGY-1 Pager [phone]  After 5pm on weekdays and 1pm on weekends: On Call pager [phone]

## 2022-12-03 NOTE — Plan of Care (Signed)

## 2022-12-03 NOTE — Progress Notes (Signed)
Mobility Specialist Progress Note:   12/03/22 1020  Mobility  Activity Ambulated with assistance in hallway  Level of Assistance Contact guard assist, steadying assist  Assistive Device Front wheel walker  Distance Ambulated (ft) 400 ft  Activity Response Tolerated well  Mobility Referral Yes  $Mobility charge 1 Mobility   Pt agreeable to mobility session. Required minG for safety. SpO2 91% on RA throughout ambulation. Pt back in bed with all needs met.   Nelta Numbers Mobility Specialist Please contact via [employer] or  Rehab office at [phone]

## 2022-12-03 NOTE — Progress Notes (Signed)
Occupational Therapy Treatment Patient Details Name: Steven Rosario MRN: [id number] DOB: 12-27-1928 Today's Date: 12/03/2022   History of present illness Pt is a 89 y/o M presenting to ED on 2/28 with SOB, admitted for acute combined systolic and diastolic heart failure. PMH includes HTN, HLD, CKD IV, CAD, arrhythmia s/p pacemaker 2018.   OT comments  Patient with good progress toward patient focused goals.  He is probably very close to his baseline.  Setup and supervision for ADL from a sit to stand level, and patient reaching for objects in his environment to navigate in the room.  Patient stating he holds onto objects at home.  Patient stating he may transition home tomorrow.  O2 sats 95% on RA.     Recommendations for follow up therapy are one component of a multi-disciplinary discharge planning process, led by the attending physician.  Recommendations may be updated based on patient status, additional functional criteria and insurance authorization.    Follow Up Recommendations  Home health OT     Assistance Recommended at Discharge PRN  Patient can return home with the following  Assistance with cooking/housework;Assist for transportation;Help with stairs or ramp for entrance   Equipment Recommendations       Recommendations for Other Services      Precautions / Restrictions Precautions Precautions: Fall Restrictions Weight Bearing Restrictions: No       Mobility Bed Mobility   Bed Mobility: Supine to Sit     Supine to sit: Min guard          Transfers Overall transfer level: Needs assistance Equipment used: None Transfers: Sit to/from Stand Sit to Stand: Supervision                 Balance Overall balance assessment: Needs assistance Sitting-balance support: Feet supported Sitting balance-Leahy Scale: Good     Standing balance support: Single extremity supported Standing balance-Leahy Scale: Fair                             ADL  either performed or assessed with clinical judgement   ADL       Grooming: Set up;Sitting       Lower Body Bathing: Supervison/ safety;Sit to/from stand   Upper Body Dressing : Set up;Sitting   Lower Body Dressing: Supervision/safety;Sit to/from stand                      Extremity/Trunk Assessment Upper Extremity Assessment Upper Extremity Assessment: Overall WFL for tasks assessed   Lower Extremity Assessment Lower Extremity Assessment: Defer to PT evaluation   Cervical / Trunk Assessment Cervical / Trunk Assessment: Normal                      Cognition Arousal/Alertness: Awake/alert Behavior During Therapy: WFL for tasks assessed/performed Overall Cognitive Status: Within Functional Limits for tasks assessed                                                             Pertinent Vitals/ Pain       Pain Assessment Pain Assessment: No/denies pain  Frequency  Min 2X/week        Progress Toward Goals  OT Goals(current goals can now be found in the care plan section)  Progress towards OT goals: Progressing toward goals  Acute Rehab OT Goals OT Goal Formulation: With patient Time For Goal Achievement: 12/13/22 Potential to Achieve Goals: Good  Plan Discharge plan remains appropriate    Co-evaluation                 AM-PAC OT "6 Clicks" Daily Activity     Outcome Measure   Help from another person eating meals?: None Help from another person taking care of personal grooming?: None Help from another person toileting, which includes using toliet, bedpan, or urinal?: A Little Help from another person bathing (including washing, rinsing, drying)?: A Little Help from another person to put on and taking off regular upper body clothing?: None Help from another person to put on and taking off regular lower body clothing?: A Little 6  Click Score: 21    End of Session    OT Visit Diagnosis: Unsteadiness on feet (R26.81);Other abnormalities of gait and mobility (R26.89);Muscle weakness (generalized) (M62.81);History of falling (Z91.81)   Activity Tolerance Patient tolerated treatment well   Patient Left in chair;with call bell/phone within reach;with chair alarm set   Nurse Communication Mobility status        Time: 1006-1030 OT Time Calculation (min): 24 min  Charges: OT General Charges $OT Visit: 1 Visit OT Treatments $Self Care/Home Management : 23-37 mins  12/03/2022  RP, OTR/L  Acute Rehabilitation Services  Office:  [phone]   Steven Rosario 12/03/2022, 10:33 AM

## 2022-12-04 ENCOUNTER — Other Ambulatory Visit (HOSPITAL_COMMUNITY): Payer: Self-pay

## 2022-12-04 DIAGNOSIS — I5041 Acute combined systolic (congestive) and diastolic (congestive) heart failure: Secondary | ICD-10-CM

## 2022-12-04 DIAGNOSIS — R195 Other fecal abnormalities: Secondary | ICD-10-CM | POA: Diagnosis not present

## 2022-12-04 DIAGNOSIS — N184 Chronic kidney disease, stage 4 (severe): Secondary | ICD-10-CM | POA: Diagnosis not present

## 2022-12-04 LAB — RENAL FUNCTION PANEL
Albumin: 2.7 g/dL — ABNORMAL LOW (ref 3.5–5.0)
Anion gap: 8 (ref 5–15)
BUN: 43 mg/dL — ABNORMAL HIGH (ref 8–23)
CO2: 22 mmol/L (ref 22–32)
Calcium: 8.3 mg/dL — ABNORMAL LOW (ref 8.9–10.3)
Chloride: 108 mmol/L (ref 98–111)
Creatinine, Ser: 3.58 mg/dL — ABNORMAL HIGH (ref 0.61–1.24)
GFR, Estimated: 15 mL/min — ABNORMAL LOW (ref >60)
Glucose, Bld: 104 mg/dL — ABNORMAL HIGH (ref 70–99)
Phosphorus: 3 mg/dL (ref 2.5–4.6)
Potassium: 3.8 mmol/L (ref 3.5–5.1)
Sodium: 138 mmol/L (ref 135–145)

## 2022-12-04 LAB — CBC
HCT: 26.5 % — ABNORMAL LOW (ref 39.0–52.0)
Hemoglobin: 8 g/dL — ABNORMAL LOW (ref 13.0–17.0)
MCH: 25.2 pg — ABNORMAL LOW (ref 26.0–34.0)
MCHC: 30.2 g/dL (ref 30.0–36.0)
MCV: 83.3 fL (ref 80.0–100.0)
Platelets: 183 10*3/uL (ref 150–400)
RBC: 3.18 MIL/uL — ABNORMAL LOW (ref 4.22–5.81)
RDW: 17.2 % — ABNORMAL HIGH (ref 11.5–15.5)
WBC: 6 10*3/uL (ref 4.0–10.5)
nRBC: 0 % (ref 0.0–0.2)

## 2022-12-04 LAB — GLUCOSE, CAPILLARY: Glucose-Capillary: 103 mg/dL — ABNORMAL HIGH (ref 70–99)

## 2022-12-04 LAB — SURGICAL PATHOLOGY

## 2022-12-04 MED ORDER — FUROSEMIDE 40 MG PO TABS
ORAL_TABLET | ORAL | 0 refills | Status: DC
  Filled 2022-12-04: qty 45, 30d supply, fill #0

## 2022-12-04 MED ORDER — PANTOPRAZOLE SODIUM 40 MG PO TBEC
40.0000 mg | DELAYED_RELEASE_TABLET | Freq: Two times a day (BID) | ORAL | 0 refills | Status: DC
  Filled 2022-12-04: qty 110, 55d supply, fill #0

## 2022-12-04 NOTE — TOC Transition Note (Addendum)
Transition of Care [facility]) - CM/SW Discharge Note   Patient Details  Name: Narciso Utt MRN: [id number] Date of Birth: [birth year]  Transition of Care [facility]) CM/SW Contact:  Zenon Mayo, RN Phone Number: 12/04/2022, 1:09 PM   Clinical Narrative:    Patient is for dc today, NCM notified Tommi Rumps with [facility].  Patient son will transport him home.  Please call patient son when he is ready for transport.     Barriers to Discharge: Continued Medical Work up   Patient Goals and CMS Choice CMS Medicare.gov Compare Post Acute Care list provided to:: Patient Choice offered to / list presented to : Patient  Discharge Placement                         Discharge Plan and Services Additional resources added to the After Visit Summary for   In-house Referral: NA Discharge Planning Services: CM Consult Post Acute Care Choice: Home Health          DME Arranged: N/A DME Agency: NA       HH Arranged: RN, PT HH Agency: [place] Date [place]: 12/04/22 Time HH Agency Contacted: 22 Representative spoke with at [place]: [place] ([place]) Interventions SDOH Screenings   Food Insecurity: No Food Insecurity (11/28/2022)  Housing: Low Risk  (11/28/2022)  Transportation Needs: No Transportation Needs (11/28/2022)  Utilities: Not At Risk (11/28/2022)  Alcohol Screen: Low Risk  (11/02/2022)  Financial Resource Strain: Low Risk  (11/02/2022)  Tobacco Use: Low Risk  (12/03/2022)     Readmission Risk Interventions    12/04/2022   11:28 AM 11/30/2022    4:26 PM  Readmission Risk Prevention Plan  Transportation Screening Complete Complete  PCP or Specialist Appt within 3-5 Days Complete   HRI or [place] Complete Complete  Palliative Care Screening Not Applicable Not Applicable  Medication Review (RN Care Manager) Complete Complete

## 2022-12-04 NOTE — TOC Initial Note (Addendum)
Transition of Care [facility]) - Initial/Assessment Note    Patient Details  Name: Steven Rosario MRN: [id number] Date of Birth: 1928/10/04  Transition of [facility]) CM/SW Contact:    Zenon Mayo, RN Phone Number: 12/04/2022, 11:32 AM  Clinical Narrative:                 NCM spoke with patient at the bedside, he is active with [facility] for HHPT, will add HHRN, HHOT, per pt eval he does not need HHPT.   he states he would like to continue with [facility] for [facility] services, he lives alone..  He has two walkers at home.  Son is his support system.  Son will transport him home at discharge.  Will need orders for [place],  [place].    Expected Discharge Plan: [place] Barriers to Discharge: Continued Medical Work up   Patient Goals and CMS Choice Patient states their goals for this hospitalization and ongoing recovery are:: return home CMS Medicare.gov Compare Post Acute Care list provided to:: Patient Choice offered to / list presented to : Patient      Expected Discharge Plan and Services In-house Referral: NA Discharge Planning Services: CM Consult Post Acute Care Choice: Home Health Living arrangements for the past 2 months: Single Family Home                 DME Arranged: N/A DME Agency: NA       HH Arranged RN,  OT HH Agency: [place] Date [facility] Agency Contacted: 12/04/22 Time HH Agency Contacted: 48 Representative spoke with at [place]: Tommi Rumps  Prior Living Arrangements/Services Living arrangements for the past 2 months: [place] with:: Self Patient language and need for interpreter reviewed:: Yes Do you feel safe going back to the place where you live?: Yes      Need for Family Participation in Patient Care: Yes (Comment) Care giver support system in place?: Yes (comment) Current home services:  (2 walkers and a cane) Criminal Activity/Legal Involvement Pertinent to Current Situation/Hospitalization: No - Comment as  needed  Activities of Daily Living Home Assistive Devices/Equipment: Walker (specify type) ADL Screening (condition at time of admission) Patient's cognitive ability adequate to safely complete daily activities?: Yes Is the patient deaf or have difficulty hearing?: Yes Does the patient have difficulty seeing, even when wearing glasses/contacts?: No Does the patient have difficulty concentrating, remembering, or making decisions?: No Patient able to express need for assistance with ADLs?: Yes Does the patient have difficulty dressing or bathing?: No Independently performs ADLs?: Yes (appropriate for developmental age) Does the patient have difficulty walking or climbing stairs?: No Weakness of Legs: Both Weakness of Arms/Hands: Both  Permission Sought/Granted                  Emotional Assessment Appearance:: Appears stated age Attitude/Demeanor/Rapport: Engaged Affect (typically observed): Appropriate Orientation: : Oriented to Self, Oriented to Place, Oriented to  Time, Oriented to Situation Alcohol / Substance Use: Not Applicable Psych Involvement: No (comment)  Admission diagnosis:  Acute combined systolic and diastolic heart failure (HCC) [I50.41] Renal dysfunction [N28.9] Acute on chronic systolic congestive heart failure (HCC) [I50.23] Occult GI bleeding [R19.5] Patient Active Problem List   Diagnosis Date Noted   Chronic kidney disease (CKD), stage IV (severe) ([place]) 12/03/2022   Occult GI bleeding 11/29/2022   Renal dysfunction 11/29/2022   Acute combined systolic and diastolic heart failure ([place]) 11/28/2022   Arrhythmia 11/02/2022   Acute  kidney injury superimposed on chronic kidney disease ([place])    Coronary artery disease    Essential hypertension, benign 11/17/2019   Hypercholesteremia 11/17/2019   Pacemaker [employer] device implant date 08/23/2017 11/17/2019   Senile arthritis 11/17/2019   Esophageal reflux 11/17/2019   Pernicious anemia 11/17/2019    Gout, unspecified 11/17/2019   PCP:  Angelina Sheriff, MD Pharmacy:   [facility], [place] [address] Phone: [phone] Fax: [phone]  [id number] [place], [place] - [address]: [phone]: [phone]    Social Determinants of Health (SDOH) Social History: SDOH Screenings   Food Insecurity: No Food Insecurity (11/28/2022)  Housing: Low Risk  (11/28/2022)  Transportation Needs: No Transportation Needs (11/28/2022)  Utilities: Not At Risk (11/28/2022)  Alcohol Screen: Low Risk  (11/02/2022)  Financial Resource Strain: Low Risk  (11/02/2022)  Tobacco Use: Low Risk  (12/03/2022)   SDOH Interventions:     Readmission Risk Interventions    12/04/2022   11:28 AM 11/30/2022    4:26 PM  Readmission Risk Prevention Plan  Transportation Screening Complete Complete  PCP or Specialist Appt within 3-5 Days Complete   HRI or Home Care Consult Complete Complete  Palliative Care Screening Not Applicable Not Applicable  Medication Review (RN Care Manager) Complete Complete

## 2022-12-04 NOTE — Discharge Summary (Signed)
Name: Steven Rosario MRN: [id number] DOB: 1929/09/10 89 y.o. PCP: Angelina Sheriff, MD  Date of Admission: 11/28/2022 10:47 AM Date of Discharge: 12/04/2022 Attending Physician: Lottie Mussel, MD  Discharge Diagnosis: Principal Problem:   Acute combined systolic and diastolic heart failure ([place]) Active Problems:   Occult GI bleeding   Renal dysfunction   Chronic kidney disease (CKD), stage IV (severe) ([place])     Discharge Medications: Allergies as of 12/04/2022   No Known Allergies      Medication List     STOP taking these medications    aspirin EC 81 MG tablet       TAKE these medications    acetaminophen 650 MG CR tablet Commonly known as: TYLENOL Take 1,300 mg by mouth every evening.   amiodarone 200 MG tablet Commonly known as: PACERONE Take 1 tablet (200 mg total) by mouth daily.   amLODipine 5 MG tablet Commonly known as: NORVASC Take 10 mg by mouth daily.   apixaban 2.5 MG Tabs tablet Commonly known as: ELIQUIS Take 2.5 mg by mouth 2 (two) times daily.   atorvastatin 20 MG tablet Commonly known as: LIPITOR Take 20 mg by mouth daily.   carvedilol 12.5 MG tablet Commonly known as: COREG Take 1 tablet (12.5 mg total) by mouth 2 (two) times daily.   cyanocobalamin 1000 MCG tablet Commonly known as: VITAMIN B12 Take 1,000 mcg by mouth daily.   FIBER PO Take 1 tablet by mouth daily.   furosemide 40 MG tablet Commonly known as: LASIX Take 1 tablet (40 mg total) by mouth in the morning AND 1 tablet (40 mg total)  every other afternoon. If your morning weight is less than 181.0lbs, then skip your afternoon dose on that day. Regardless of weight, you will take '40mg'$  each morning.. What changed: See the new instructions.   hydrALAZINE 25 MG tablet Commonly known as: APRESOLINE Take 1.5 tablets (37.5 mg total) by mouth 3 (three) times daily.   isosorbide dinitrate 20 MG tablet Commonly known as: ISORDIL Take 1 tablet (20 mg total) by mouth 3  (three) times daily.   pantoprazole 40 MG tablet Commonly known as: PROTONIX Take 1 tablet (40 mg total) by mouth 2 (two) times daily before a meal. What changed:  medication strength how much to take when to take this   potassium chloride 10 MEQ CR capsule Commonly known as: MICRO-K Take 10 mEq by mouth daily.   PreserVision AREDS 2 Caps Take 1 capsule by mouth 3 (three) times daily.   CENTRUM SILVER PO Take by mouth.   VITAMIN D (CHOLECALCIFEROL) PO Take 500 mcg by mouth daily.         Disposition and follow-up:    Steven.Steven Rosario was discharged from [facility] in Stable condition.  At the hospital follow up visit please address:   1.  Heart failure: check volume status and weight with goal of 83kg, verify adherence to furosemide regimen of '40mg'$  daily plus another dose for leg swelling or weight gain greater than three pounds  2.  CKD IV: verify adherence to furosemide regimen, check BMP for renal function and electrolytes, consider hemodialysis in the future if renal function worsens  3.  Anemia: inquire about bloody stools, check hemoglobin and arrange transfusion if less than 7.0, confirm adherence to pantoprazole twice daily last dose on 4-29 and iron supplementation, confirm discontinuation of aspirin, arrange repeat iron studies in 2-3 months  4.  Hypoxia: developed transient hypoxia overnight that  resolved with supplemental oxygen, may have underlying sleep apnea and could benefit from outpatient sleep study  5. Labs / imaging needed at time of follow-up: BMP, CBC, consider sleep study, iron panel in 5/03-2023  6.  Pending labs / tests needing follow-up: colonoscopy biopsy results    Follow-up Appointments:  Follow-up Information     [facility] Follow up.   Specialty: [place] Why: Agency will contact you to set up apt times Contact information: [facility]  [id number] [phone]         Angelina Sheriff, MD Follow up.   Specialty: Family Medicine Contact information: [place] [address] [phone]                Please schedule follow-up with your primary care provider in 1-2 weeks Nephrology appointment within the next 1-2 weeks Cardiology on 3-12 and 5-10    Hospital Course by problem list:  Steven Steven Rosario is a 89 year old male with a past medical history of hypertension, hyperlipidemia, pernicious anemia, CKD IV, CAD, CHF, and arrhythmia post pacemaker 2018 who presented with breath shortness and fatigue, now admitted for acute on chronic heart failure exacerbation secondary to cardiorenal syndrome.       ---Acute heart failure reduced ejection fraction exacerbation ---Chronic right-sided small-moderate pleural effusion Patient has history of heart failure reduced ejection fraction. Most recent echocardiogram performed 2-1 demonstrated EF 30-35, grade II diastolic dysfunction, and mild LA-RA dilation. Recently admitted in 10-2022 for acute exacerbation, discharged with home medications including furosemide and carvedilol. Dry weight at that time was 80kg. Describes sometimes skipping his afternoon dose of the furosemide. He presented on 2-28 with breath shortness and fatigue that started about two weeks after prior discharge. Laboratory tests revealed elevated creatinine and BNP. Exam demonstrated evidence of fluid overload. Presentation is consistent with acute heart failure exacerbation secondary to cardiorenal syndrome. Two doses of intravenous furosemide '60mg'$  precipitated net fluid loss 3.3L, after which patient appeared euvolemic. Finalizing home furosemide regimen required multiple-day trial and close monitoring of weight. Discharged with furosemide alternating between '40mg'$  q48 and '80mg'$  q48 plus instructions to skip afternoon '40mg'$  dose if less than 181lbs. Home isosorbide dinitrate, carvedilol, and  hydralazine were continued throughout hospitalization and upon discharge.    ---Normocytic iron-deficiency anemia  ---Positive fecal occult blood test and multiple colonic polyps ---History of hemorrhoids  Patient has history of chronic anemia likely secondary to CKD IV and iron deficiency. Recent outpatient study revealed ferritin 47, iron 29, and saturation 9. Reports intermittent red-colored toilet paper and some straining with defecation. History also notable for possible melena on morning of admission. Upon arrival, FOBT positive and hemoglobin was 8.6 below previous baseline around 10. Hemoglobin dropped to 7.7 on hospitalization day two, raising concern for acute bleed, but then remained stable around 8.0. New baseline is likely 7.0-9.0. Intravenous pantoprazole and ferric gluconate given to correct calculated iron deficit. Upper endoscopy demonstrated gastritis without acute bleeding. Colonoscopy revealed 19-20 polyps all <21m and removed via cold snare, biopsy results pending. Discharged with twice-daily pantoprazole, last dose on 4-29. Aspirin held during hospitalization and discontinued upon discharge given elevated risk for bleeding from polyps. Encouraged to take iron supplements on Mo-We-Fr schedule.      ---Chronic kidney disease IV Patient has history of chronic kidney disease with recent baseline creatinine around 3.0-3.3. [facility]. Upon arrival, creatinine elevated above baseline to 4.19. Exam notable for  evidence of volume overload, secondary to cardiorenal syndrome. Diuresis with intravenous furosemide started, discontinued on 2-29 given euvolemic exam and worsening renal function. Since starting oral furosemide regimen at anticipated home dosing regimen, renal function has remained stable. Discharged with furosemide alternating between '40mg'$  q48 and '80mg'$  q48 plus instructions to skip afternoon '40mg'$  dose if less than 181lbs.      ---Transient nocturnal hypoxia Overnight on 3-4, patient developed hypoxia with SpO2 down to 84% and was subsequently placed on 2L/min supplemental oxygen. His SpO2 improved thereafter and remained within normal limits during daytime hours. Patient may have underlying sleep apnea and further outpatient diagnostic workup is warranted. Discharged with recommendation for sleep study as outpatient.   ---Arrhythmia post pacemaker 2018 Patient has history of arrhythmia, atrioventricular pacemaker was placed in 2018. Currently takes amiodarone, aspirin, and apixaban. Upon arrival, electrocardiogram demonstrated sinus ventricular-paced rhythm unchanged from prior study. Home apixaban was resumed on 3-3, aspirin discontinued upon discharge given his higher risk of bleeding with multiple colonic polyps.    ---Coronary artery disease post stent ---Hyperlipidemia Patient has history of coronary artery disease post two LAD stents placed in 2012. Previously managed with aspirin and atorvastatin at home. Aspirin was discontinued upon discharge, anticoagulation monotherapy with apixaban instead. Atorvastatin has been continued.    ---Hypertension Patient has history of hypertension managed at home with amlodipine. He also regularly takes carvedilol, isosorbide-dinitrate, hydralazine, and furosemide. Blood pressure has remained with normal or slightly-elevated range since admission, home amlodipine was resumed on 3-3. Discharged with prior home regimen of carvedilol, isosorbide dinitrate, hydralazine, and amlodipine.     ---Gastroesophageal reflux disease Patient has a history of gastroesophageal reflux disease managed with pantoprazole at home. Intravenous pantoprazole started at higher dose during current hospitalization. Discharged with pantoprazole '40mg'$  q12, last dose scheduled for 4-29.     ---Vitamin B12 and D deficiency Patient has history of vitamin B12 and D deficiency managed at home with respective  supplements. Cyanocobalamin was continued throughout hospitalization and upon discharge.    Discharge Exam:    BP (!) 151/67 (BP Location: Left Arm)   Pulse 60   Temp 98 F (36.7 C) (Oral)   Resp 18   Ht '5\' 8"'$  (1.727 m)   Wt 83.7 kg   SpO2 95%   BMI 28.06 kg/m   Subjective: Patient was feeling good this morning, ready for discharge. Discussed home furosemide regimen in great detail and recommended iron supplementation. Emphasized importance of following up with primary care provider, nephrologist, and cardiologist.  General:                       awake and alert, lying comfortably in bed eating breakfast, cooperative and pleasant, not in acute distress Lungs:                          normal respiratory effort, breathing unlabored, symmetrical chest rise, no crackles or wheezing Cardiac:                        regular rate and rhythm, normal S1 and S2, no pitting edema Abdomen:                     soft and non-distended, normoactive bowel sounds present in all four quadrants, no tenderness to palpation or guarding Neurologic:                   oriented  to person-place-time, moving all extremities, no gross focal deficits Psychiatric:                   euthymic mood with congruent affect, intelligible speech   Pertinent Labs, Studies, and Procedures:   Labs:    Latest Ref Rng & Units 12/04/2022    1:11 AM 12/03/2022   12:31 AM 12/02/2022    7:28 AM  CBC  WBC 4.0 - 10.5 K/uL 6.0  6.0  8.1   Hemoglobin 13.0 - 17.0 g/dL 8.0  7.6  8.3   Hematocrit 39.0 - 52.0 % 26.5  25.1  26.6   Platelets 150 - 400 K/uL 183  181  175        Latest Ref Rng & Units 12/04/2022    1:11 AM 12/03/2022   12:31 AM 12/02/2022    7:28 AM  CMP  Glucose 70 - 99 mg/dL 104  117  117   BUN 8 - 23 mg/dL 43  44  43   Creatinine 0.61 - 1.24 mg/dL 3.58  3.52  3.46   Sodium 135 - 145 mmol/L 138  138  140   Potassium 3.5 - 5.1 mmol/L 3.8  4.0  3.5   Chloride 98 - 111 mmol/L 108  108  110   CO2 22 - 32 mmol/L '22   23  20   '$ Calcium 8.9 - 10.3 mg/dL 8.3  8.2  8.2     ______________________  Imaging:  DG Chest 2 View Result Date: 11/28/2022 IMPRESSION: Small to moderate right pleural effusion.    ______________________  Procedures:  none  ______________________   Discharge Instructions:  Discharge Instructions     Diet - low sodium heart healthy   Complete by: As directed    Discharge instructions   Complete by: As directed    Steven Rosario,  It was a pleasure taking care of you while you were in the hospital. Your breath shortness was caused by the accumulation of fluid in your lungs from heart failure and chronic kidney disease. We gave you some extra furosemide, which helped remove this fluid. Since furosemide can cause damage your kidneys, we were very careful when finalizing your home dose.  Your dry weight is about 83kg or 183lbs, which is our target weight for you at home. Please take furosemide alternating between '40mg'$  and 40+'40mg'$  daily. Weigh yourself each morning and if you are less than 181lbs, then skip your afternoon dose of '40mg'$ . Regardless of your weight, you will take one '40mg'$  dose each day.   While you were here, we also found evidence of bleeding in your gastrointestinal tract. After two endoscopies, we were unable to find any active source of bleeding. Instead we found about 20 small polyps, which we removed, that can increase your risk for bleeding in the future. We discovered that you were anemic and that your iron stores were low. Your total iron deficit was '708mg'$ . Accordingly, we gave you three doses of '125mg'$  intravenous iron. When you return home, we encourage you to start taking an oral iron supplement such as ferrous sulfate or ferrous gluconate every Mon-Wed-Fri.   Please schedule a follow-up visit with your primary care provider. You already have upcoming appointments with cardiology scheduled for 3-12 and 5-10. If you develop breath shortness again or notice blood in  your stool then visit the emergency department.    All the best,   Discharge instructions   Complete by: As directed    Steven Rosario,  It was a pleasure taking care of you while you were in the hospital. Your breath shortness was caused by the accumulation of fluid in your lungs from heart failure and chronic kidney disease. We gave you some extra furosemide, which removed this fluid and helped your breathing. Since furosemide can damage your kidneys, we were very careful when finalizing your home dose regimen.  Your dry weight is about 83kg or 183lbs, which is our target weight for you at home. Please take furosemide '40mg'$  every morning and an additional '40mg'$  in the afternoon every other day. Weigh yourself each morning and if you are less than 181lbs, then skip your afternoon dose for that day. Regardless of your weight, you will take one '40mg'$  dose each morning.   While you were here, we also found evidence of bleeding in your gastrointestinal tract. After two endoscopies, we were unable to find any active source of bleeding. Instead we found about 20 small polyps, which were removed, that can increase your risk for bleeding in the future. We discovered that your iron stores were low and calculated your total iron deficit as '708mg'$ . Accordingly, we gave you three doses of '125mg'$  intravenous iron. When you return home, we encourage you to start taking an oral iron supplement such as ferrous sulfate or ferrous gluconate every Mon-Wed-Fri. Your baseline hemoglobin level is about 7.0-9.0.  Please schedule a follow-up visit with your nephrologist and primary care provider. You already have upcoming appointments with cardiology scheduled for 3-12 and 5-10. If you develop breath shortness again or notice blood in your stool then visit the emergency department.    All the best,   Increase activity slowly   Complete by: As directed    Increase activity slowly   Complete by: As directed      Steven  Rosario,  It was a pleasure taking care of you while you were in the hospital. Your breath shortness was caused by the accumulation of fluid in your lungs from heart failure and chronic kidney disease. We gave you some extra furosemide, which removed this fluid and helped your breathing. Since furosemide can damage your kidneys, we were very careful when finalizing your home dose regimen.  Your dry weight is about 83kg or 183lbs, which is our target weight for you at home. Please take furosemide '40mg'$  every morning and an additional '40mg'$  in the afternoon every other day. Weigh yourself each morning and if you are less than 181lbs, then skip your afternoon dose for that day. Regardless of your weight, you will take one '40mg'$  dose each morning.   While you were here, we also found evidence of bleeding in your gastrointestinal tract. After two endoscopies, we were unable to find any active source of bleeding. Instead we found about 20 small polyps, which were removed, that can increase your risk for bleeding in the future. We discovered that your iron stores were low and calculated your total iron deficit as '708mg'$ . Accordingly, we gave you three doses of '125mg'$  intravenous iron. When you return home, we encourage you to start taking an oral iron supplement such as ferrous sulfate or ferrous gluconate every Mon-Wed-Fri. Your baseline hemoglobin level is about 7.0-9.0.  Please schedule a follow-up visit with your nephrologist and primary care provider. You already have upcoming appointments with cardiology scheduled for 3-12 and 5-10. If you develop breath shortness again or notice blood in your stool then visit the emergency department.    All the best,  Signed: Roswell Nickel,  MD Internal Medicine PGY-1 Pager [phone]

## 2022-12-04 NOTE — Progress Notes (Signed)
Mobility Specialist - Progress Note   12/04/22 0946  Mobility  Activity Ambulated with assistance in hallway  Level of Assistance Contact guard assist, steadying assist  Assistive Device Front wheel walker  Distance Ambulated (ft) 600 ft  Activity Response Tolerated well  Mobility Referral Yes  $Mobility charge 1 Mobility    Pt received in bed and agreeable. No complaints throughout, tolerated on RA. Left in bed w/ bed alarm on and call bell at his side.   [place] Specialist Please contact via SecureChat or Rehab office at [phone]

## 2022-12-05 ENCOUNTER — Other Ambulatory Visit (HOSPITAL_COMMUNITY): Payer: Self-pay | Admitting: *Deleted

## 2022-12-05 ENCOUNTER — Encounter (HOSPITAL_COMMUNITY): Payer: Self-pay | Admitting: Gastroenterology

## 2022-12-05 DIAGNOSIS — Z1211 Encounter for screening for malignant neoplasm of colon: Secondary | ICD-10-CM | POA: Diagnosis not present

## 2022-12-06 ENCOUNTER — Ambulatory Visit (HOSPITAL_COMMUNITY)
Admission: RE | Admit: 2022-12-06 | Discharge: 2022-12-06 | Disposition: A | Discharge status: Home / Self Care | Payer: Medicare HMO | Source: Ambulatory Visit | Attending: Nephrology | Admitting: Nephrology

## 2022-12-06 DIAGNOSIS — N289 Disorder of kidney and ureter, unspecified: Secondary | ICD-10-CM | POA: Diagnosis not present

## 2022-12-06 DIAGNOSIS — N185 Chronic kidney disease, stage 5: Secondary | ICD-10-CM | POA: Diagnosis not present

## 2022-12-06 DIAGNOSIS — I5041 Acute combined systolic (congestive) and diastolic (congestive) heart failure: Secondary | ICD-10-CM | POA: Diagnosis not present

## 2022-12-06 MED ORDER — SODIUM CHLORIDE 0.9 % IV SOLN
200.0000 mg | INTRAVENOUS | Status: DC
  Administered 2022-12-06: 200 mg via INTRAVENOUS
  Filled 2022-12-06: qty 200

## 2022-12-07 DIAGNOSIS — I080 Rheumatic disorders of both mitral and aortic valves: Secondary | ICD-10-CM | POA: Diagnosis not present

## 2022-12-07 DIAGNOSIS — C44629 Squamous cell carcinoma of skin of left upper limb, including shoulder: Secondary | ICD-10-CM | POA: Diagnosis not present

## 2022-12-07 DIAGNOSIS — L578 Other skin changes due to chronic exposure to nonionizing radiation: Secondary | ICD-10-CM | POA: Diagnosis not present

## 2022-12-07 DIAGNOSIS — I130 Hypertensive heart and chronic kidney disease with heart failure and stage 1 through stage 4 chronic kidney disease, or unspecified chronic kidney disease: Secondary | ICD-10-CM | POA: Diagnosis not present

## 2022-12-07 DIAGNOSIS — I2510 Atherosclerotic heart disease of native coronary artery without angina pectoris: Secondary | ICD-10-CM | POA: Diagnosis not present

## 2022-12-07 DIAGNOSIS — L570 Actinic keratosis: Secondary | ICD-10-CM | POA: Diagnosis not present

## 2022-12-07 DIAGNOSIS — L728 Other follicular cysts of the skin and subcutaneous tissue: Secondary | ICD-10-CM | POA: Diagnosis not present

## 2022-12-07 DIAGNOSIS — I5042 Chronic combined systolic (congestive) and diastolic (congestive) heart failure: Secondary | ICD-10-CM | POA: Diagnosis not present

## 2022-12-07 DIAGNOSIS — I77810 Thoracic aortic ectasia: Secondary | ICD-10-CM | POA: Diagnosis not present

## 2022-12-07 DIAGNOSIS — I472 Ventricular tachycardia, unspecified: Secondary | ICD-10-CM | POA: Diagnosis not present

## 2022-12-07 DIAGNOSIS — L7211 Pilar cyst: Secondary | ICD-10-CM | POA: Diagnosis not present

## 2022-12-07 DIAGNOSIS — N184 Chronic kidney disease, stage 4 (severe): Secondary | ICD-10-CM | POA: Diagnosis not present

## 2022-12-07 DIAGNOSIS — N179 Acute kidney failure, unspecified: Secondary | ICD-10-CM | POA: Diagnosis not present

## 2022-12-07 DIAGNOSIS — I480 Paroxysmal atrial fibrillation: Secondary | ICD-10-CM | POA: Diagnosis not present

## 2022-12-10 DIAGNOSIS — I480 Paroxysmal atrial fibrillation: Secondary | ICD-10-CM | POA: Diagnosis not present

## 2022-12-10 DIAGNOSIS — I472 Ventricular tachycardia, unspecified: Secondary | ICD-10-CM | POA: Diagnosis not present

## 2022-12-10 DIAGNOSIS — I5042 Chronic combined systolic (congestive) and diastolic (congestive) heart failure: Secondary | ICD-10-CM | POA: Diagnosis not present

## 2022-12-10 DIAGNOSIS — I2510 Atherosclerotic heart disease of native coronary artery without angina pectoris: Secondary | ICD-10-CM | POA: Diagnosis not present

## 2022-12-10 DIAGNOSIS — I130 Hypertensive heart and chronic kidney disease with heart failure and stage 1 through stage 4 chronic kidney disease, or unspecified chronic kidney disease: Secondary | ICD-10-CM | POA: Diagnosis not present

## 2022-12-10 DIAGNOSIS — N184 Chronic kidney disease, stage 4 (severe): Secondary | ICD-10-CM | POA: Diagnosis not present

## 2022-12-10 DIAGNOSIS — I080 Rheumatic disorders of both mitral and aortic valves: Secondary | ICD-10-CM | POA: Diagnosis not present

## 2022-12-10 DIAGNOSIS — N179 Acute kidney failure, unspecified: Secondary | ICD-10-CM | POA: Diagnosis not present

## 2022-12-10 DIAGNOSIS — I77810 Thoracic aortic ectasia: Secondary | ICD-10-CM | POA: Diagnosis not present

## 2022-12-11 ENCOUNTER — Encounter: Payer: Self-pay | Admitting: Internal Medicine

## 2022-12-11 ENCOUNTER — Ambulatory Visit (HOSPITAL_COMMUNITY): Payer: Medicare HMO

## 2022-12-11 ENCOUNTER — Telehealth (HOSPITAL_COMMUNITY): Payer: Self-pay

## 2022-12-11 NOTE — Telephone Encounter (Signed)
Attempted to reach patient to confirm appointment for 12/12/22 - no answer.

## 2022-12-12 ENCOUNTER — Ambulatory Visit (HOSPITAL_COMMUNITY)
Admit: 2022-12-12 | Discharge: 2022-12-12 | Disposition: A | Discharge status: Home / Self Care | Payer: Medicare HMO | Attending: Adult Health | Admitting: Adult Health

## 2022-12-12 VITALS — BP 110/50 | HR 64 | Wt 180.0 lb

## 2022-12-12 DIAGNOSIS — I5042 Chronic combined systolic (congestive) and diastolic (congestive) heart failure: Secondary | ICD-10-CM | POA: Diagnosis not present

## 2022-12-12 DIAGNOSIS — I5022 Chronic systolic (congestive) heart failure: Secondary | ICD-10-CM

## 2022-12-12 DIAGNOSIS — I130 Hypertensive heart and chronic kidney disease with heart failure and stage 1 through stage 4 chronic kidney disease, or unspecified chronic kidney disease: Secondary | ICD-10-CM | POA: Insufficient documentation

## 2022-12-12 DIAGNOSIS — I2510 Atherosclerotic heart disease of native coronary artery without angina pectoris: Secondary | ICD-10-CM | POA: Diagnosis not present

## 2022-12-12 DIAGNOSIS — I480 Paroxysmal atrial fibrillation: Secondary | ICD-10-CM | POA: Insufficient documentation

## 2022-12-12 DIAGNOSIS — D508 Other iron deficiency anemias: Secondary | ICD-10-CM | POA: Diagnosis not present

## 2022-12-12 DIAGNOSIS — N184 Chronic kidney disease, stage 4 (severe): Secondary | ICD-10-CM | POA: Insufficient documentation

## 2022-12-12 DIAGNOSIS — Z79899 Other long term (current) drug therapy: Secondary | ICD-10-CM | POA: Diagnosis not present

## 2022-12-12 DIAGNOSIS — Z7901 Long term (current) use of anticoagulants: Secondary | ICD-10-CM | POA: Insufficient documentation

## 2022-12-12 LAB — CBC
HCT: 29.5 % — ABNORMAL LOW (ref 39.0–52.0)
Hemoglobin: 8.9 g/dL — ABNORMAL LOW (ref 13.0–17.0)
MCH: 26.2 pg (ref 26.0–34.0)
MCHC: 30.2 g/dL (ref 30.0–36.0)
MCV: 86.8 fL (ref 80.0–100.0)
Platelets: 260 10*3/uL (ref 150–400)
RBC: 3.4 MIL/uL — ABNORMAL LOW (ref 4.22–5.81)
RDW: 19.2 % — ABNORMAL HIGH (ref 11.5–15.5)
WBC: 5.3 10*3/uL (ref 4.0–10.5)
nRBC: 0 % (ref 0.0–0.2)

## 2022-12-12 LAB — BASIC METABOLIC PANEL
Anion gap: 13 (ref 5–15)
BUN: 52 mg/dL — ABNORMAL HIGH (ref 8–23)
CO2: 21 mmol/L — ABNORMAL LOW (ref 22–32)
Calcium: 8.6 mg/dL — ABNORMAL LOW (ref 8.9–10.3)
Chloride: 107 mmol/L (ref 98–111)
Creatinine, Ser: 4.06 mg/dL — ABNORMAL HIGH (ref 0.61–1.24)
GFR, Estimated: 13 mL/min — ABNORMAL LOW (ref >60)
Glucose, Bld: 128 mg/dL — ABNORMAL HIGH (ref 70–99)
Potassium: 4.6 mmol/L (ref 3.5–5.1)
Sodium: 141 mmol/L (ref 135–145)

## 2022-12-12 NOTE — Progress Notes (Signed)
HEART IMPACT TRANSITIONS OF CARE    PCP: Primary Cardiologist:  HPI: r Zaitz is a 89 year old with history of HTN, CAD post stents x2 in 2012, paroxysmal atrial fibrillation on Eliquis, ST Jude  dual-chamber pacemaker implanted 08/23/2017 due to sick sinus syndrome and complete heart block, gout, anemia, CKD Stage IV, and chronic combined systolic/diastolic HF.    Admitted 04/03/22 with A/C HFrEF. Diuresed with IV lasix and later transitioned to po lasix. GDMT limited by CKD Stage IV.    Admitted in Jan and Feb of the year with heart failure.  Most recent admit (February) complicated by AKI and GI bleed. Diuresed with IV lasix. Aspirin was stopped. Colonoscopy showed gastritis, polyp stomach/duodenum.  Discharged on lasix 40 mg daily and every other afternoon 40 mg lasix. GDMT limited by CKD Stage IV.   Overall feeling fine. Occasionally short of breath. No bleeding issues. Denies PND/Orthopnea. Able Appetite ok. No fever or chills. Weight at home 175 pounds. Taking all medications. Lives alone. He drives to his apopointments.     Cardiac Testing  Echo 2024 EF 30-35% RV moderately reduced Grade II DD.   Echo 2023 EF 35-40% RV ok Grade IIDD    ROS: All systems negative except as listed in HPI, PMH and Problem List.  SH:  Social History   Socioeconomic History   Marital status: Widowed    Spouse name: Not on file   Number of children: 4   Years of education: Not on file   Highest education level: 8th grade  Occupational History   Occupation: retired   Tobacco Use   Smoking status: Never   Smokeless tobacco: Never  Vaping Use   Vaping Use: Never used  Substance and Sexual Activity   Alcohol use: Not Currently    Comment: past   Drug use: Never   Sexual activity: Not on file  Other Topics Concern   Not on file  Social History Narrative   Not on file   Social Determinants of Health   Financial Resource Strain: Low Risk  (11/02/2022)   Overall Financial Resource  Strain (CARDIA)    Difficulty of Paying Living Expenses: Not very hard  Food Insecurity: No Food Insecurity (11/28/2022)   Hunger Vital Sign    Worried About Running Out of Food in the Last Year: Never true    Ran Out of Food in the Last Year: Never true  Transportation Needs: No Transportation Needs (11/28/2022)   PRAPARE - Hydrologist (Medical): No    Lack of Transportation (Non-Medical): No  Physical Activity: Not on file  Stress: Not on file  Social Connections: Not on file  Intimate Partner Violence: Not At Risk (11/28/2022)   Humiliation, Afraid, Rape, and Kick questionnaire    Fear of Current or Ex-Partner: No    Emotionally Abused: No    Physically Abused: No    Sexually Abused: No    FH:  Family History  Problem Relation Age of Onset   Diabetes Mother    Obesity Mother    Heart failure Mother    CAD Father    Bone cancer Father    Heart disease Sister    Obesity Brother    Lung cancer Son     Past Medical History:  Diagnosis Date   Acute kidney injury (AKI) with acute tubular necrosis (ATN) ([place])    Cervical disc disease    Chronic kidney disease    Coronary artery disease  Gout    Hypertension    Nephrolithiasis    Pneumonia due to COVID-19 virus    Presence of stent in LAD coronary artery    Prostate cancer (HCC)    S/P placement of cardiac pacemaker    Urinary incontinence     Current Outpatient Medications  Medication Sig Dispense Refill   acetaminophen (TYLENOL) 650 MG CR tablet Take 1,300 mg by mouth every evening.     amiodarone (PACERONE) 200 MG tablet Take 1 tablet (200 mg total) by mouth daily. 90 tablet 3   amLODipine (NORVASC) 5 MG tablet Take 10 mg by mouth daily.     apixaban (ELIQUIS) 2.5 MG TABS tablet Take 2.5 mg by mouth 2 (two) times daily.     atorvastatin (LIPITOR) 20 MG tablet Take 20 mg by mouth daily.     carvedilol (COREG) 12.5 MG tablet Take 1 tablet (12.5 mg total) by mouth 2 (two) times daily. 60  tablet 11   cyanocobalamin (VITAMIN B12) 1000 MCG tablet Take 1,000 mcg by mouth daily.     FIBER PO Take 1 tablet by mouth daily.     furosemide (LASIX) 40 MG tablet Take 1 tablet (40 mg total) by mouth in the morning AND 1 tablet (40 mg total)  every other afternoon. If your morning weight is less than 181.0lbs, then skip your afternoon dose on that day. Regardless of weight, you will take '40mg'$  each morning.. 45 tablet 0   hydrALAZINE (APRESOLINE) 25 MG tablet Take 1.5 tablets (37.5 mg total) by mouth 3 (three) times daily. 405 tablet 3   isosorbide dinitrate (ISORDIL) 20 MG tablet Take 1 tablet (20 mg total) by mouth 3 (three) times daily. 270 tablet 3   Multiple Vitamins-Minerals (CENTRUM SILVER PO) Take by mouth.     Multiple Vitamins-Minerals (PRESERVISION AREDS 2) CAPS Take 1 capsule by mouth 3 (three) times daily.     pantoprazole (PROTONIX) 40 MG tablet Take 1 tablet (40 mg total) by mouth 2 (two) times daily before a meal. 110 tablet 0   VITAMIN D, CHOLECALCIFEROL, PO Take 500 mcg by mouth daily.     potassium chloride (MICRO-K) 10 MEQ CR capsule Take 10 mEq by mouth daily.     No current facility-administered medications for this encounter.    Vitals:   12/12/22 0959  BP: (!) 110/50  Pulse: 64  SpO2: 99%  Weight: 81.6 kg (180 lb)   Wt Readings from Last 3 Encounters:  12/12/22 81.6 kg (180 lb)  12/04/22 83.7 kg (184 lb 8.4 oz)  11/02/22 80.8 kg (178 lb 1.6 oz)    PHYSICAL EXAM: General:  Arrived in a wheel chair. No resp difficulty HEENT: normal Neck: supple. JVP 7-8 . Carotids 2+ bilaterally; no bruits. No lymphadenopathy or thryomegaly appreciated. Cor: PMI normal. Regular rate & rhythm. No rubs, gallops or murmurs. Lungs: clear Abdomen: soft, nontender, nondistended. No hepatosplenomegaly. No bruits or masses. Good bowel sounds. Extremities: no cyanosis, clubbing, rash, edema Neuro: alert & orientedx3, cranial nerves grossly intact. Moves all 4 extremities w/o  difficulty. Affect pleasant.   ASSESSMENT & PLAN:  1. Crhonc combined systolic/diastolic HF.  Echo EF 30-35% RV moderately reduced.  Grade IIDD NYHA III.  GDMT limited by CKD Stage IV Volume status ok. Reds Clip 35%.  Diuretic- Continue lasix 40 mg in am and 40 mg in evening every other day. Marland Kitchen  BB- Cotnine carvedilol 12.5 mg twice a day  Ace/ARB/ARNI- No CKD Stage IV MRA- No CKD Stage IV  SGLT2i- Hold off jardiance for now.  Continue hydralazinee 37.5 mg three times a day + isordil 20 mg three times a day.    2. CKD Stage IV Most recent creatinine baseline ~ 3.6.  Check BMET    3. HTN  Stable.   4. PAF -Continue amio 200 mg daily.  - Continue eliquis   5. IDA  Followed by GI.   Follow up 3-4 weeks to reassess volume status.   Danne Vasek NP-C  12:37 PM

## 2022-12-12 NOTE — Progress Notes (Signed)
ReDS Vest / Clip - 12/12/22 0959       ReDS Vest / Clip   Station Marker C    Ruler Value 27    ReDS Value Range Low volume    ReDS Actual Value 35

## 2022-12-12 NOTE — Patient Instructions (Signed)
Labs done today. We will contact you only if your labs are abnormal.  No medication changes were made. Please continue all current medications as prescribed.  Your physician recommends that you schedule a follow-up appointment in: 3-4 weeks  If you have any questions or concerns before your next appointment please send [place] a message through [place] or call our office at [phone].    TO LEAVE A MESSAGE FOR THE NURSE SELECT OPTION 2, PLEASE LEAVE A MESSAGE INCLUDING: YOUR NAME DATE OF BIRTH CALL BACK NUMBER REASON FOR CALL**this is important as we prioritize the call backs  YOU WILL RECEIVE A CALL BACK THE SAME DAY AS LONG AS YOU CALL BEFORE 4:00 PM   Do the following things EVERYDAY: Weigh yourself in the morning before breakfast. Write it down and keep it in a log. Take your medicines as prescribed Eat low salt foods--Limit salt (sodium) to 2000 mg per day.  Stay as active as you can everyday Limit all fluids for the day to less than 2 liters   At the [facility], you and your health needs are our priority. As part of our continuing mission to provide you with exceptional heart care, we have created designated Provider Care Teams. These Care Teams include your primary Cardiologist (physician) and Advanced Practice Providers (APPs- Physician Assistants and Nurse Practitioners) who all work together to provide you with the care you need, when you need it.   You may see any of the following providers on your designated Care Team at your next follow up: Dr Glori Bickers Dr Haynes Kerns, NP Lyda Jester, [place] Audry Riles, PharmD   Please be sure to bring in all your medications bottles to every appointment. '

## 2022-12-12 NOTE — Addendum Note (Signed)
Encounter addended by: Domingo Sep, RN on: 12/12/2022 2:01 PM  Actions taken: Flowsheet accepted, Clinical Note Signed, Charge Capture section accepted

## 2022-12-13 ENCOUNTER — Inpatient Hospital Stay (HOSPITAL_COMMUNITY): Admit: 2022-12-13 | Payer: Medicare HMO

## 2022-12-13 DIAGNOSIS — I4891 Unspecified atrial fibrillation: Secondary | ICD-10-CM | POA: Diagnosis not present

## 2022-12-13 DIAGNOSIS — K219 Gastro-esophageal reflux disease without esophagitis: Secondary | ICD-10-CM | POA: Diagnosis not present

## 2022-12-13 DIAGNOSIS — I2510 Atherosclerotic heart disease of native coronary artery without angina pectoris: Secondary | ICD-10-CM | POA: Diagnosis not present

## 2022-12-13 DIAGNOSIS — R195 Other fecal abnormalities: Secondary | ICD-10-CM | POA: Diagnosis not present

## 2022-12-13 DIAGNOSIS — I77810 Thoracic aortic ectasia: Secondary | ICD-10-CM | POA: Diagnosis not present

## 2022-12-13 DIAGNOSIS — I5042 Chronic combined systolic (congestive) and diastolic (congestive) heart failure: Secondary | ICD-10-CM | POA: Diagnosis not present

## 2022-12-13 DIAGNOSIS — N179 Acute kidney failure, unspecified: Secondary | ICD-10-CM | POA: Diagnosis not present

## 2022-12-13 DIAGNOSIS — I080 Rheumatic disorders of both mitral and aortic valves: Secondary | ICD-10-CM | POA: Diagnosis not present

## 2022-12-13 DIAGNOSIS — N184 Chronic kidney disease, stage 4 (severe): Secondary | ICD-10-CM | POA: Diagnosis not present

## 2022-12-13 DIAGNOSIS — N183 Chronic kidney disease, stage 3 unspecified: Secondary | ICD-10-CM | POA: Diagnosis not present

## 2022-12-13 DIAGNOSIS — I472 Ventricular tachycardia, unspecified: Secondary | ICD-10-CM | POA: Diagnosis not present

## 2022-12-13 DIAGNOSIS — I130 Hypertensive heart and chronic kidney disease with heart failure and stage 1 through stage 4 chronic kidney disease, or unspecified chronic kidney disease: Secondary | ICD-10-CM | POA: Diagnosis not present

## 2022-12-13 DIAGNOSIS — I10 Essential (primary) hypertension: Secondary | ICD-10-CM | POA: Diagnosis not present

## 2022-12-13 DIAGNOSIS — D509 Iron deficiency anemia, unspecified: Secondary | ICD-10-CM | POA: Diagnosis not present

## 2022-12-13 DIAGNOSIS — I480 Paroxysmal atrial fibrillation: Secondary | ICD-10-CM | POA: Diagnosis not present

## 2022-12-13 DIAGNOSIS — I509 Heart failure, unspecified: Secondary | ICD-10-CM | POA: Diagnosis not present

## 2022-12-13 DIAGNOSIS — Z6827 Body mass index (BMI) 27.0-27.9, adult: Secondary | ICD-10-CM | POA: Diagnosis not present

## 2022-12-14 ENCOUNTER — Telehealth (HOSPITAL_COMMUNITY): Payer: Self-pay | Admitting: *Deleted

## 2022-12-14 DIAGNOSIS — I5022 Chronic systolic (congestive) heart failure: Secondary | ICD-10-CM

## 2022-12-14 DIAGNOSIS — N184 Chronic kidney disease, stage 4 (severe): Secondary | ICD-10-CM

## 2022-12-14 MED ORDER — FUROSEMIDE 40 MG PO TABS: 40.0000 mg | ORAL_TABLET | Freq: Every day | ORAL | 3 refills | Status: DC

## 2022-12-14 NOTE — Telephone Encounter (Signed)
Called patient per Darrick Grinder, NP with following lab results and instructions:  "Renal function looks a little worse today. Hold lasix today and tomorrow. Restart 3/18. Please dont take lasix if weight is less than 181 pounds. Needs BMET next week. Hemoglobin has gone up to 8.9. Stable."   Pt verbalized understanding of same. BMP scheduled and ordered. Pt will call [facility] at [phone] if any questions.

## 2022-12-19 DIAGNOSIS — I130 Hypertensive heart and chronic kidney disease with heart failure and stage 1 through stage 4 chronic kidney disease, or unspecified chronic kidney disease: Secondary | ICD-10-CM | POA: Diagnosis not present

## 2022-12-19 DIAGNOSIS — I77810 Thoracic aortic ectasia: Secondary | ICD-10-CM | POA: Diagnosis not present

## 2022-12-19 DIAGNOSIS — I2510 Atherosclerotic heart disease of native coronary artery without angina pectoris: Secondary | ICD-10-CM | POA: Diagnosis not present

## 2022-12-19 DIAGNOSIS — N2581 Secondary hyperparathyroidism of renal origin: Secondary | ICD-10-CM | POA: Diagnosis not present

## 2022-12-19 DIAGNOSIS — N184 Chronic kidney disease, stage 4 (severe): Secondary | ICD-10-CM | POA: Diagnosis not present

## 2022-12-19 DIAGNOSIS — I5042 Chronic combined systolic (congestive) and diastolic (congestive) heart failure: Secondary | ICD-10-CM | POA: Diagnosis not present

## 2022-12-19 DIAGNOSIS — I080 Rheumatic disorders of both mitral and aortic valves: Secondary | ICD-10-CM | POA: Diagnosis not present

## 2022-12-19 DIAGNOSIS — I509 Heart failure, unspecified: Secondary | ICD-10-CM | POA: Diagnosis not present

## 2022-12-19 DIAGNOSIS — I480 Paroxysmal atrial fibrillation: Secondary | ICD-10-CM | POA: Diagnosis not present

## 2022-12-19 DIAGNOSIS — D631 Anemia in chronic kidney disease: Secondary | ICD-10-CM | POA: Diagnosis not present

## 2022-12-19 DIAGNOSIS — N179 Acute kidney failure, unspecified: Secondary | ICD-10-CM | POA: Diagnosis not present

## 2022-12-19 DIAGNOSIS — I472 Ventricular tachycardia, unspecified: Secondary | ICD-10-CM | POA: Diagnosis not present

## 2022-12-20 ENCOUNTER — Encounter (HOSPITAL_COMMUNITY): Payer: Medicare HMO

## 2022-12-20 ENCOUNTER — Other Ambulatory Visit (HOSPITAL_COMMUNITY): Payer: Medicare HMO

## 2022-12-20 DIAGNOSIS — N184 Chronic kidney disease, stage 4 (severe): Secondary | ICD-10-CM | POA: Diagnosis not present

## 2022-12-20 DIAGNOSIS — N179 Acute kidney failure, unspecified: Secondary | ICD-10-CM | POA: Diagnosis not present

## 2022-12-20 DIAGNOSIS — I130 Hypertensive heart and chronic kidney disease with heart failure and stage 1 through stage 4 chronic kidney disease, or unspecified chronic kidney disease: Secondary | ICD-10-CM | POA: Diagnosis not present

## 2022-12-20 DIAGNOSIS — I5042 Chronic combined systolic (congestive) and diastolic (congestive) heart failure: Secondary | ICD-10-CM | POA: Diagnosis not present

## 2022-12-20 DIAGNOSIS — I080 Rheumatic disorders of both mitral and aortic valves: Secondary | ICD-10-CM | POA: Diagnosis not present

## 2022-12-20 DIAGNOSIS — I480 Paroxysmal atrial fibrillation: Secondary | ICD-10-CM | POA: Diagnosis not present

## 2022-12-20 DIAGNOSIS — I77810 Thoracic aortic ectasia: Secondary | ICD-10-CM | POA: Diagnosis not present

## 2022-12-20 DIAGNOSIS — I472 Ventricular tachycardia, unspecified: Secondary | ICD-10-CM | POA: Diagnosis not present

## 2022-12-20 DIAGNOSIS — I2510 Atherosclerotic heart disease of native coronary artery without angina pectoris: Secondary | ICD-10-CM | POA: Diagnosis not present

## 2022-12-21 DIAGNOSIS — I472 Ventricular tachycardia, unspecified: Secondary | ICD-10-CM | POA: Diagnosis not present

## 2022-12-21 DIAGNOSIS — I480 Paroxysmal atrial fibrillation: Secondary | ICD-10-CM | POA: Diagnosis not present

## 2022-12-21 DIAGNOSIS — I130 Hypertensive heart and chronic kidney disease with heart failure and stage 1 through stage 4 chronic kidney disease, or unspecified chronic kidney disease: Secondary | ICD-10-CM | POA: Diagnosis not present

## 2022-12-21 DIAGNOSIS — N179 Acute kidney failure, unspecified: Secondary | ICD-10-CM | POA: Diagnosis not present

## 2022-12-21 DIAGNOSIS — I080 Rheumatic disorders of both mitral and aortic valves: Secondary | ICD-10-CM | POA: Diagnosis not present

## 2022-12-21 DIAGNOSIS — I5042 Chronic combined systolic (congestive) and diastolic (congestive) heart failure: Secondary | ICD-10-CM | POA: Diagnosis not present

## 2022-12-21 DIAGNOSIS — I77810 Thoracic aortic ectasia: Secondary | ICD-10-CM | POA: Diagnosis not present

## 2022-12-21 DIAGNOSIS — I2510 Atherosclerotic heart disease of native coronary artery without angina pectoris: Secondary | ICD-10-CM | POA: Diagnosis not present

## 2022-12-21 DIAGNOSIS — N184 Chronic kidney disease, stage 4 (severe): Secondary | ICD-10-CM | POA: Diagnosis not present

## 2022-12-24 DIAGNOSIS — I472 Ventricular tachycardia, unspecified: Secondary | ICD-10-CM | POA: Diagnosis not present

## 2022-12-24 DIAGNOSIS — I5042 Chronic combined systolic (congestive) and diastolic (congestive) heart failure: Secondary | ICD-10-CM | POA: Diagnosis not present

## 2022-12-24 DIAGNOSIS — N184 Chronic kidney disease, stage 4 (severe): Secondary | ICD-10-CM | POA: Diagnosis not present

## 2022-12-24 DIAGNOSIS — I2510 Atherosclerotic heart disease of native coronary artery without angina pectoris: Secondary | ICD-10-CM | POA: Diagnosis not present

## 2022-12-24 DIAGNOSIS — I130 Hypertensive heart and chronic kidney disease with heart failure and stage 1 through stage 4 chronic kidney disease, or unspecified chronic kidney disease: Secondary | ICD-10-CM | POA: Diagnosis not present

## 2022-12-24 DIAGNOSIS — N179 Acute kidney failure, unspecified: Secondary | ICD-10-CM | POA: Diagnosis not present

## 2022-12-24 DIAGNOSIS — I480 Paroxysmal atrial fibrillation: Secondary | ICD-10-CM | POA: Diagnosis not present

## 2022-12-24 DIAGNOSIS — I080 Rheumatic disorders of both mitral and aortic valves: Secondary | ICD-10-CM | POA: Diagnosis not present

## 2022-12-24 DIAGNOSIS — I77810 Thoracic aortic ectasia: Secondary | ICD-10-CM | POA: Diagnosis not present

## 2022-12-25 ENCOUNTER — Telehealth (HOSPITAL_COMMUNITY): Payer: Self-pay | Admitting: Cardiology

## 2022-12-25 DIAGNOSIS — I5042 Chronic combined systolic (congestive) and diastolic (congestive) heart failure: Secondary | ICD-10-CM | POA: Diagnosis not present

## 2022-12-25 DIAGNOSIS — I130 Hypertensive heart and chronic kidney disease with heart failure and stage 1 through stage 4 chronic kidney disease, or unspecified chronic kidney disease: Secondary | ICD-10-CM | POA: Diagnosis not present

## 2022-12-25 DIAGNOSIS — I77810 Thoracic aortic ectasia: Secondary | ICD-10-CM | POA: Diagnosis not present

## 2022-12-25 DIAGNOSIS — N179 Acute kidney failure, unspecified: Secondary | ICD-10-CM | POA: Diagnosis not present

## 2022-12-25 DIAGNOSIS — I472 Ventricular tachycardia, unspecified: Secondary | ICD-10-CM | POA: Diagnosis not present

## 2022-12-25 DIAGNOSIS — I2510 Atherosclerotic heart disease of native coronary artery without angina pectoris: Secondary | ICD-10-CM | POA: Diagnosis not present

## 2022-12-25 DIAGNOSIS — I480 Paroxysmal atrial fibrillation: Secondary | ICD-10-CM | POA: Diagnosis not present

## 2022-12-25 DIAGNOSIS — N184 Chronic kidney disease, stage 4 (severe): Secondary | ICD-10-CM | POA: Diagnosis not present

## 2022-12-25 DIAGNOSIS — I080 Rheumatic disorders of both mitral and aortic valves: Secondary | ICD-10-CM | POA: Diagnosis not present

## 2022-12-25 NOTE — Telephone Encounter (Signed)
Weight 179-181 Weight today 185.8lb Increased LE edema 2+ R leg 3+ L leg Only SOb with exertion Nephrology stopped lasix and aspirin on 3/20 [facility]-notes/labs are not available at this time)  Kelsey,RN [place]  [phone])  -advised about leg elevation, decreased sodium, and limiting fluid

## 2022-12-25 NOTE — Telephone Encounter (Signed)
Kelsey aware

## 2022-12-25 NOTE — Telephone Encounter (Signed)
Please advise to call Nephrology given Lasix was stopped by Nephrology.   Malillany Kazlauskas NP-C  2:27 PM .

## 2022-12-25 NOTE — Progress Notes (Signed)
Remote pacemaker transmission.   

## 2022-12-26 DIAGNOSIS — D485 Neoplasm of uncertain behavior of skin: Secondary | ICD-10-CM | POA: Diagnosis not present

## 2022-12-27 ENCOUNTER — Encounter (HOSPITAL_COMMUNITY): Payer: Medicare HMO

## 2023-01-02 ENCOUNTER — Encounter (HOSPITAL_COMMUNITY)
Admission: RE | Admit: 2023-01-02 | Discharge: 2023-01-02 | Disposition: A | Discharge status: Home / Self Care | Payer: Medicare HMO | Source: Ambulatory Visit | Attending: Nephrology | Admitting: Nephrology

## 2023-01-02 DIAGNOSIS — N189 Chronic kidney disease, unspecified: Secondary | ICD-10-CM | POA: Diagnosis not present

## 2023-01-02 DIAGNOSIS — I472 Ventricular tachycardia, unspecified: Secondary | ICD-10-CM | POA: Diagnosis not present

## 2023-01-02 DIAGNOSIS — I080 Rheumatic disorders of both mitral and aortic valves: Secondary | ICD-10-CM | POA: Diagnosis not present

## 2023-01-02 DIAGNOSIS — I77810 Thoracic aortic ectasia: Secondary | ICD-10-CM | POA: Diagnosis not present

## 2023-01-02 DIAGNOSIS — I5042 Chronic combined systolic (congestive) and diastolic (congestive) heart failure: Secondary | ICD-10-CM | POA: Diagnosis not present

## 2023-01-02 DIAGNOSIS — D631 Anemia in chronic kidney disease: Secondary | ICD-10-CM | POA: Diagnosis not present

## 2023-01-02 DIAGNOSIS — I130 Hypertensive heart and chronic kidney disease with heart failure and stage 1 through stage 4 chronic kidney disease, or unspecified chronic kidney disease: Secondary | ICD-10-CM | POA: Diagnosis not present

## 2023-01-02 DIAGNOSIS — N179 Acute kidney failure, unspecified: Secondary | ICD-10-CM | POA: Diagnosis not present

## 2023-01-02 DIAGNOSIS — I2510 Atherosclerotic heart disease of native coronary artery without angina pectoris: Secondary | ICD-10-CM | POA: Diagnosis not present

## 2023-01-02 DIAGNOSIS — I480 Paroxysmal atrial fibrillation: Secondary | ICD-10-CM | POA: Diagnosis not present

## 2023-01-02 DIAGNOSIS — N184 Chronic kidney disease, stage 4 (severe): Secondary | ICD-10-CM | POA: Diagnosis not present

## 2023-01-02 MED ORDER — SODIUM CHLORIDE 0.9 % IV SOLN
200.0000 mg | INTRAVENOUS | Status: DC
  Administered 2023-01-02: 200 mg via INTRAVENOUS
  Filled 2023-01-02: qty 200

## 2023-01-03 DIAGNOSIS — N179 Acute kidney failure, unspecified: Secondary | ICD-10-CM | POA: Diagnosis not present

## 2023-01-03 DIAGNOSIS — I130 Hypertensive heart and chronic kidney disease with heart failure and stage 1 through stage 4 chronic kidney disease, or unspecified chronic kidney disease: Secondary | ICD-10-CM | POA: Diagnosis not present

## 2023-01-03 DIAGNOSIS — I480 Paroxysmal atrial fibrillation: Secondary | ICD-10-CM | POA: Diagnosis not present

## 2023-01-03 DIAGNOSIS — I5042 Chronic combined systolic (congestive) and diastolic (congestive) heart failure: Secondary | ICD-10-CM | POA: Diagnosis not present

## 2023-01-03 DIAGNOSIS — I2510 Atherosclerotic heart disease of native coronary artery without angina pectoris: Secondary | ICD-10-CM | POA: Diagnosis not present

## 2023-01-03 DIAGNOSIS — N184 Chronic kidney disease, stage 4 (severe): Secondary | ICD-10-CM | POA: Diagnosis not present

## 2023-01-03 DIAGNOSIS — I472 Ventricular tachycardia, unspecified: Secondary | ICD-10-CM | POA: Diagnosis not present

## 2023-01-03 DIAGNOSIS — I77810 Thoracic aortic ectasia: Secondary | ICD-10-CM | POA: Diagnosis not present

## 2023-01-03 DIAGNOSIS — I080 Rheumatic disorders of both mitral and aortic valves: Secondary | ICD-10-CM | POA: Diagnosis not present

## 2023-01-04 DIAGNOSIS — I480 Paroxysmal atrial fibrillation: Secondary | ICD-10-CM | POA: Diagnosis not present

## 2023-01-04 DIAGNOSIS — N179 Acute kidney failure, unspecified: Secondary | ICD-10-CM | POA: Diagnosis not present

## 2023-01-04 DIAGNOSIS — I472 Ventricular tachycardia, unspecified: Secondary | ICD-10-CM | POA: Diagnosis not present

## 2023-01-04 DIAGNOSIS — I2510 Atherosclerotic heart disease of native coronary artery without angina pectoris: Secondary | ICD-10-CM | POA: Diagnosis not present

## 2023-01-04 DIAGNOSIS — I080 Rheumatic disorders of both mitral and aortic valves: Secondary | ICD-10-CM | POA: Diagnosis not present

## 2023-01-04 DIAGNOSIS — I77810 Thoracic aortic ectasia: Secondary | ICD-10-CM | POA: Diagnosis not present

## 2023-01-04 DIAGNOSIS — N184 Chronic kidney disease, stage 4 (severe): Secondary | ICD-10-CM | POA: Diagnosis not present

## 2023-01-04 DIAGNOSIS — I130 Hypertensive heart and chronic kidney disease with heart failure and stage 1 through stage 4 chronic kidney disease, or unspecified chronic kidney disease: Secondary | ICD-10-CM | POA: Diagnosis not present

## 2023-01-04 DIAGNOSIS — I5042 Chronic combined systolic (congestive) and diastolic (congestive) heart failure: Secondary | ICD-10-CM | POA: Diagnosis not present

## 2023-01-09 ENCOUNTER — Encounter (HOSPITAL_COMMUNITY)
Admission: RE | Admit: 2023-01-09 | Discharge: 2023-01-09 | Disposition: A | Discharge status: Home / Self Care | Payer: Medicare HMO | Source: Ambulatory Visit | Attending: Nephrology | Admitting: Nephrology

## 2023-01-09 DIAGNOSIS — I080 Rheumatic disorders of both mitral and aortic valves: Secondary | ICD-10-CM | POA: Diagnosis not present

## 2023-01-09 DIAGNOSIS — I77810 Thoracic aortic ectasia: Secondary | ICD-10-CM | POA: Diagnosis not present

## 2023-01-09 DIAGNOSIS — I130 Hypertensive heart and chronic kidney disease with heart failure and stage 1 through stage 4 chronic kidney disease, or unspecified chronic kidney disease: Secondary | ICD-10-CM | POA: Diagnosis not present

## 2023-01-09 DIAGNOSIS — I2510 Atherosclerotic heart disease of native coronary artery without angina pectoris: Secondary | ICD-10-CM | POA: Diagnosis not present

## 2023-01-09 DIAGNOSIS — N189 Chronic kidney disease, unspecified: Secondary | ICD-10-CM | POA: Diagnosis not present

## 2023-01-09 DIAGNOSIS — N184 Chronic kidney disease, stage 4 (severe): Secondary | ICD-10-CM | POA: Diagnosis not present

## 2023-01-09 DIAGNOSIS — I472 Ventricular tachycardia, unspecified: Secondary | ICD-10-CM | POA: Diagnosis not present

## 2023-01-09 DIAGNOSIS — I5043 Acute on chronic combined systolic (congestive) and diastolic (congestive) heart failure: Secondary | ICD-10-CM | POA: Diagnosis not present

## 2023-01-09 DIAGNOSIS — I480 Paroxysmal atrial fibrillation: Secondary | ICD-10-CM | POA: Diagnosis not present

## 2023-01-09 DIAGNOSIS — D631 Anemia in chronic kidney disease: Secondary | ICD-10-CM | POA: Diagnosis not present

## 2023-01-09 MED ORDER — SODIUM CHLORIDE 0.9 % IV SOLN
200.0000 mg | INTRAVENOUS | Status: DC
  Administered 2023-01-09: 200 mg via INTRAVENOUS
  Filled 2023-01-09: qty 10

## 2023-01-10 ENCOUNTER — Ambulatory Visit (HOSPITAL_COMMUNITY)
Admission: RE | Admit: 2023-01-10 | Discharge: 2023-01-10 | Disposition: A | Discharge status: Home / Self Care | Payer: Medicare HMO | Source: Ambulatory Visit | Attending: Cardiology | Admitting: Cardiology

## 2023-01-10 VITALS — BP 112/70 | HR 63 | Wt 185.6 lb

## 2023-01-10 DIAGNOSIS — I2510 Atherosclerotic heart disease of native coronary artery without angina pectoris: Secondary | ICD-10-CM

## 2023-01-10 DIAGNOSIS — Z8719 Personal history of other diseases of the digestive system: Secondary | ICD-10-CM | POA: Insufficient documentation

## 2023-01-10 DIAGNOSIS — Z955 Presence of coronary angioplasty implant and graft: Secondary | ICD-10-CM | POA: Diagnosis not present

## 2023-01-10 DIAGNOSIS — I480 Paroxysmal atrial fibrillation: Secondary | ICD-10-CM | POA: Diagnosis not present

## 2023-01-10 DIAGNOSIS — D631 Anemia in chronic kidney disease: Secondary | ICD-10-CM | POA: Diagnosis not present

## 2023-01-10 DIAGNOSIS — I130 Hypertensive heart and chronic kidney disease with heart failure and stage 1 through stage 4 chronic kidney disease, or unspecified chronic kidney disease: Secondary | ICD-10-CM | POA: Insufficient documentation

## 2023-01-10 DIAGNOSIS — N184 Chronic kidney disease, stage 4 (severe): Secondary | ICD-10-CM

## 2023-01-10 DIAGNOSIS — I5022 Chronic systolic (congestive) heart failure: Secondary | ICD-10-CM

## 2023-01-10 DIAGNOSIS — Z950 Presence of cardiac pacemaker: Secondary | ICD-10-CM | POA: Insufficient documentation

## 2023-01-10 DIAGNOSIS — I77810 Thoracic aortic ectasia: Secondary | ICD-10-CM | POA: Diagnosis not present

## 2023-01-10 DIAGNOSIS — I472 Ventricular tachycardia, unspecified: Secondary | ICD-10-CM | POA: Diagnosis not present

## 2023-01-10 DIAGNOSIS — I5042 Chronic combined systolic (congestive) and diastolic (congestive) heart failure: Secondary | ICD-10-CM | POA: Diagnosis not present

## 2023-01-10 DIAGNOSIS — Z79899 Other long term (current) drug therapy: Secondary | ICD-10-CM | POA: Insufficient documentation

## 2023-01-10 DIAGNOSIS — Z7901 Long term (current) use of anticoagulants: Secondary | ICD-10-CM | POA: Diagnosis not present

## 2023-01-10 DIAGNOSIS — I5043 Acute on chronic combined systolic (congestive) and diastolic (congestive) heart failure: Secondary | ICD-10-CM | POA: Diagnosis not present

## 2023-01-10 DIAGNOSIS — I080 Rheumatic disorders of both mitral and aortic valves: Secondary | ICD-10-CM | POA: Diagnosis not present

## 2023-01-10 LAB — BASIC METABOLIC PANEL
Anion gap: 12 (ref 5–15)
BUN: 54 mg/dL — ABNORMAL HIGH (ref 8–23)
CO2: 22 mmol/L (ref 22–32)
Calcium: 8.5 mg/dL — ABNORMAL LOW (ref 8.9–10.3)
Chloride: 108 mmol/L (ref 98–111)
Creatinine, Ser: 3.97 mg/dL — ABNORMAL HIGH (ref 0.61–1.24)
GFR, Estimated: 14 mL/min — ABNORMAL LOW (ref >60)
Glucose, Bld: 133 mg/dL — ABNORMAL HIGH (ref 70–99)
Potassium: 3.5 mmol/L (ref 3.5–5.1)
Sodium: 142 mmol/L (ref 135–145)

## 2023-01-10 NOTE — Progress Notes (Signed)
ReDS Vest / Clip - 01/10/23 1000       ReDS Vest / Clip   Station Marker C    Ruler Value 28    ReDS Value Range High volume overload    ReDS Actual Value 49

## 2023-01-10 NOTE — Progress Notes (Signed)
HEART IMPACT TRANSITIONS OF CARE    PCP:Dr [place]  Primary Cardiologist: EP: [facility]   HPI:  Steven Rosario is a 89 year old with history of HTN, CAD post stents x2 in 2012, paroxysmal atrial fibrillation on Eliquis, ST Jude  dual-chamber pacemaker implanted 08/23/2017 due to sick sinus syndrome and complete heart block, gout, anemia, CKD Stage IV, and chronic combined systolic/diastolic HF.    Admitted 04/03/22 with A/C HFrEF. Diuresed with IV lasix and later transitioned to po lasix. GDMT limited by CKD Stage IV.    Admitted in Jan and Feb of the year with heart failure.  Most recent admit (February) complicated by AKI and GI bleed. Diuresed with IV lasix. Aspirin was stopped. Colonoscopy showed gastritis, polyp stomach/duodenum.  Discharged on lasix 40 mg daily and every other afternoon 40 mg lasix. GDMT limited by CKD Stage IV.    Today he returns for HF follow up.Overall feeling fine. Denies SOB/PND/Orthopnea. Uses a walker. He has cut back on fluid intake. Appetite ok. No fever or chills.  Hasn't been weighing recently. Taking all medications. Lives alone. Drives to appointments.     Cardiac Testing  Echo 2024 EF 30-35% RV moderately reduced Grade II DD.   Echo 2023 EF 35-40% RV ok Grade IIDD   ROS: All systems negative except as listed in HPI, PMH and Problem List.  SH:  Social History   Socioeconomic History   Marital status: Widowed    Spouse name: Not on file   Number of children: 4   Years of education: Not on file   Highest education level: 8th grade  Occupational History   Occupation: retired   Tobacco Use   Smoking status: Never   Smokeless tobacco: Never  Vaping Use   Vaping Use: Never used  Substance and Sexual Activity   Alcohol use: Not Currently    Comment: past   Drug use: Never   Sexual activity: Not on file  Other Topics Concern   Not on file  Social History Narrative   Not on file   Social Determinants of Health    Financial Resource Strain: Low Risk  (11/02/2022)   Overall Financial Resource Strain (CARDIA)    Difficulty of Paying Living Expenses: Not very hard  Food Insecurity: No Food Insecurity (11/28/2022)   Hunger Vital Sign    Worried About Running Out of Food in the Last Year: Never true    Ran Out of Food in the Last Year: Never true  Transportation Needs: No Transportation Needs (11/28/2022)   PRAPARE - Administrator, Civil Service (Medical): No    Lack of Transportation (Non-Medical): No  Physical Activity: Not on file  Stress: Not on file  Social Connections: Not on file  Intimate Partner Violence: Not At Risk (11/28/2022)   Humiliation, Afraid, Rape, and Kick questionnaire    Fear of Current or Ex-Partner: No    Emotionally Abused: No    Physically Abused: No    Sexually Abused: No    FH:  Family History  Problem Relation Age of Onset   Diabetes Mother    Obesity Mother    Heart failure Mother    CAD Father    Bone cancer Father    Heart disease Sister    Obesity Brother    Lung cancer Son     Past Medical History:  Diagnosis Date   Acute kidney injury (AKI) with acute tubular necrosis (ATN) ([facility])  Cervical disc disease    Chronic kidney disease    Coronary artery disease    Gout    Hypertension    Nephrolithiasis    Pneumonia due to COVID-19 virus    Presence of stent in LAD coronary artery    Prostate cancer (HCC)    S/P placement of cardiac pacemaker    Urinary incontinence     Current Outpatient Medications  Medication Sig Dispense Refill   acetaminophen (TYLENOL) 650 MG CR tablet Take 1,300 mg by mouth every evening.     amiodarone (PACERONE) 200 MG tablet Take 1 tablet (200 mg total) by mouth daily. 90 tablet 3   amLODipine (NORVASC) 5 MG tablet Take 10 mg by mouth daily.     apixaban (ELIQUIS) 2.5 MG TABS tablet Take 2.5 mg by mouth 2 (two) times daily.     atorvastatin (LIPITOR) 20 MG tablet Take 20 mg by mouth daily.     carvedilol  (COREG) 12.5 MG tablet Take 1 tablet (12.5 mg total) by mouth 2 (two) times daily. 60 tablet 11   cyanocobalamin (VITAMIN B12) 1000 MCG tablet Take 1,000 mcg by mouth daily.     FIBER PO Take 1 tablet by mouth daily.     furosemide (LASIX) 40 MG tablet Take 1 tablet (40 mg total) by mouth daily. Please dont take lasix if weight is less than 181 pounds. 90 tablet 3   hydrALAZINE (APRESOLINE) 25 MG tablet Take 1.5 tablets (37.5 mg total) by mouth 3 (three) times daily. 405 tablet 3   isosorbide dinitrate (ISORDIL) 20 MG tablet Take 1 tablet (20 mg total) by mouth 3 (three) times daily. 270 tablet 3   Multiple Vitamins-Minerals (CENTRUM SILVER PO) Take by mouth.     Multiple Vitamins-Minerals (PRESERVISION AREDS 2) CAPS Take 1 capsule by mouth 3 (three) times daily.     pantoprazole (PROTONIX) 40 MG tablet Take 1 tablet (40 mg total) by mouth 2 (two) times daily before a meal. 110 tablet 0   potassium chloride (MICRO-K) 10 MEQ CR capsule Take 10 mEq by mouth daily.     VITAMIN D, CHOLECALCIFEROL, PO Take 500 mcg by mouth daily.     No current facility-administered medications for this encounter.    Vitals:   01/10/23 0949  BP: 112/70  Pulse: 63  SpO2: 99%  Weight: 84.2 kg (185 lb 9.6 oz)   Wt Readings from Last 3 Encounters:  01/10/23 84.2 kg (185 lb 9.6 oz)  01/09/23 81.6 kg (180 lb)  12/12/22 81.6 kg (180 lb)    PHYSICAL EXAM: General:  Arrived in wheelchair. No resp difficulty HEENT: normal Neck: supple. JVP flat. Carotids 2+ bilaterally; no bruits. No lymphadenopathy or thryomegaly appreciated. Cor: PMI normal. Regular rate & rhythm. No rubs, gallops or murmurs. Lungs: clear Abdomen: soft, nontender, nondistended. No hepatosplenomegaly. No bruits or masses. Good bowel sounds. Extremities: no cyanosis, clubbing, rash, edema Neuro: alert & orientedx3, cranial nerves grossly intact. Moves all 4 extremities w/o difficulty. Affect pleasant.   ECG: AV paced 63 bpm    ASSESSMENT &  PLAN: 1. Crhonc combined systolic/diastolic HF.  Echo EF 30-35% RV moderately reduced.  Grade IIDD NYHA II. Functionally doing well.  GDMT limited by CKD Stage IV  Reds Clip elevated but on exam he does not appear vo not volume overloaded.  Diuretic-Continue lasix 40 mg po daily.  BB- Cotnine carvedilol 12.5 mg twice a day  Ace/ARB/ARNI- No CKD Stage IV MRA- No CKD Stage IV SGLT2i- Hold off  jardiance for now.  Continue hydralazinee 37.5 mg three times a day + isordil 20 mg three times a day.  Check BMET    2. CKD Stage IV Most recent creatinine baseline ~ 3.6.  [facility] Check BMET today.    3. HTN  Stable. Continue current regimen.    4. PAF _EKG discussed.  -Continue amio 200 mg daily.  - Continue eliquis    5. IDA  Followed by GI.   6. CAD  Had stents 2012. No chest pain.     Follow up as needed.   Andreia Gandolfi NP-C  12:46 PM

## 2023-01-10 NOTE — Patient Instructions (Addendum)
EKG done today.  Labs done today. We will contact you only if your labs are abnormal.  No medication changes were made. Please continue all current medications as prescribed.  Thank you for allowing us to provide your heart failure care after your recent hospitalization. Please follow-up with General Cardiology.     

## 2023-01-10 NOTE — Addendum Note (Signed)
Encounter addended by: Chinita Pester, CMA on: 01/10/2023 2:12 PM  Actions taken: Clinical Note Signed

## 2023-01-14 DIAGNOSIS — I480 Paroxysmal atrial fibrillation: Secondary | ICD-10-CM | POA: Diagnosis not present

## 2023-01-14 DIAGNOSIS — N184 Chronic kidney disease, stage 4 (severe): Secondary | ICD-10-CM | POA: Diagnosis not present

## 2023-01-14 DIAGNOSIS — D631 Anemia in chronic kidney disease: Secondary | ICD-10-CM | POA: Diagnosis not present

## 2023-01-14 DIAGNOSIS — I77810 Thoracic aortic ectasia: Secondary | ICD-10-CM | POA: Diagnosis not present

## 2023-01-14 DIAGNOSIS — I2510 Atherosclerotic heart disease of native coronary artery without angina pectoris: Secondary | ICD-10-CM | POA: Diagnosis not present

## 2023-01-14 DIAGNOSIS — I472 Ventricular tachycardia, unspecified: Secondary | ICD-10-CM | POA: Diagnosis not present

## 2023-01-14 DIAGNOSIS — I130 Hypertensive heart and chronic kidney disease with heart failure and stage 1 through stage 4 chronic kidney disease, or unspecified chronic kidney disease: Secondary | ICD-10-CM | POA: Diagnosis not present

## 2023-01-14 DIAGNOSIS — I5043 Acute on chronic combined systolic (congestive) and diastolic (congestive) heart failure: Secondary | ICD-10-CM | POA: Diagnosis not present

## 2023-01-14 DIAGNOSIS — I080 Rheumatic disorders of both mitral and aortic valves: Secondary | ICD-10-CM | POA: Diagnosis not present

## 2023-01-16 ENCOUNTER — Encounter (HOSPITAL_COMMUNITY)
Admission: RE | Admit: 2023-01-16 | Discharge: 2023-01-16 | Disposition: A | Discharge status: Home / Self Care | Payer: Medicare HMO | Source: Ambulatory Visit | Attending: Nephrology | Admitting: Nephrology

## 2023-01-16 DIAGNOSIS — N189 Chronic kidney disease, unspecified: Secondary | ICD-10-CM | POA: Diagnosis not present

## 2023-01-16 DIAGNOSIS — D631 Anemia in chronic kidney disease: Secondary | ICD-10-CM | POA: Diagnosis not present

## 2023-01-16 MED ORDER — SODIUM CHLORIDE 0.9 % IV SOLN
200.0000 mg | INTRAVENOUS | Status: DC
  Administered 2023-01-16: 200 mg via INTRAVENOUS
  Filled 2023-01-16: qty 10

## 2023-01-23 ENCOUNTER — Encounter (HOSPITAL_COMMUNITY)
Admission: RE | Admit: 2023-01-23 | Discharge: 2023-01-23 | Disposition: A | Discharge status: Home / Self Care | Payer: Medicare HMO | Source: Ambulatory Visit | Attending: Nephrology | Admitting: Nephrology

## 2023-01-23 DIAGNOSIS — N189 Chronic kidney disease, unspecified: Secondary | ICD-10-CM | POA: Diagnosis not present

## 2023-01-23 DIAGNOSIS — I77810 Thoracic aortic ectasia: Secondary | ICD-10-CM | POA: Diagnosis not present

## 2023-01-23 DIAGNOSIS — I2510 Atherosclerotic heart disease of native coronary artery without angina pectoris: Secondary | ICD-10-CM | POA: Diagnosis not present

## 2023-01-23 DIAGNOSIS — I130 Hypertensive heart and chronic kidney disease with heart failure and stage 1 through stage 4 chronic kidney disease, or unspecified chronic kidney disease: Secondary | ICD-10-CM | POA: Diagnosis not present

## 2023-01-23 DIAGNOSIS — N184 Chronic kidney disease, stage 4 (severe): Secondary | ICD-10-CM | POA: Diagnosis not present

## 2023-01-23 DIAGNOSIS — I080 Rheumatic disorders of both mitral and aortic valves: Secondary | ICD-10-CM | POA: Diagnosis not present

## 2023-01-23 DIAGNOSIS — I5043 Acute on chronic combined systolic (congestive) and diastolic (congestive) heart failure: Secondary | ICD-10-CM | POA: Diagnosis not present

## 2023-01-23 DIAGNOSIS — I472 Ventricular tachycardia, unspecified: Secondary | ICD-10-CM | POA: Diagnosis not present

## 2023-01-23 DIAGNOSIS — I480 Paroxysmal atrial fibrillation: Secondary | ICD-10-CM | POA: Diagnosis not present

## 2023-01-23 DIAGNOSIS — D631 Anemia in chronic kidney disease: Secondary | ICD-10-CM | POA: Diagnosis not present

## 2023-01-23 MED ORDER — SODIUM CHLORIDE 0.9 % IV SOLN
200.0000 mg | INTRAVENOUS | Status: DC
  Administered 2023-01-23: 200 mg via INTRAVENOUS
  Filled 2023-01-23: qty 200

## 2023-01-25 DIAGNOSIS — D631 Anemia in chronic kidney disease: Secondary | ICD-10-CM | POA: Diagnosis not present

## 2023-01-25 DIAGNOSIS — I2510 Atherosclerotic heart disease of native coronary artery without angina pectoris: Secondary | ICD-10-CM | POA: Diagnosis not present

## 2023-01-25 DIAGNOSIS — N184 Chronic kidney disease, stage 4 (severe): Secondary | ICD-10-CM | POA: Diagnosis not present

## 2023-01-25 DIAGNOSIS — I130 Hypertensive heart and chronic kidney disease with heart failure and stage 1 through stage 4 chronic kidney disease, or unspecified chronic kidney disease: Secondary | ICD-10-CM | POA: Diagnosis not present

## 2023-01-25 DIAGNOSIS — I480 Paroxysmal atrial fibrillation: Secondary | ICD-10-CM | POA: Diagnosis not present

## 2023-01-25 DIAGNOSIS — I77810 Thoracic aortic ectasia: Secondary | ICD-10-CM | POA: Diagnosis not present

## 2023-01-25 DIAGNOSIS — I472 Ventricular tachycardia, unspecified: Secondary | ICD-10-CM | POA: Diagnosis not present

## 2023-01-25 DIAGNOSIS — I080 Rheumatic disorders of both mitral and aortic valves: Secondary | ICD-10-CM | POA: Diagnosis not present

## 2023-01-25 DIAGNOSIS — I5043 Acute on chronic combined systolic (congestive) and diastolic (congestive) heart failure: Secondary | ICD-10-CM | POA: Diagnosis not present

## 2023-01-29 DIAGNOSIS — N2581 Secondary hyperparathyroidism of renal origin: Secondary | ICD-10-CM | POA: Diagnosis not present

## 2023-01-29 DIAGNOSIS — D631 Anemia in chronic kidney disease: Secondary | ICD-10-CM | POA: Diagnosis not present

## 2023-01-29 DIAGNOSIS — N184 Chronic kidney disease, stage 4 (severe): Secondary | ICD-10-CM | POA: Diagnosis not present

## 2023-01-29 DIAGNOSIS — I509 Heart failure, unspecified: Secondary | ICD-10-CM | POA: Diagnosis not present

## 2023-01-29 DIAGNOSIS — I130 Hypertensive heart and chronic kidney disease with heart failure and stage 1 through stage 4 chronic kidney disease, or unspecified chronic kidney disease: Secondary | ICD-10-CM | POA: Diagnosis not present

## 2023-01-29 DIAGNOSIS — E559 Vitamin D deficiency, unspecified: Secondary | ICD-10-CM | POA: Diagnosis not present

## 2023-02-08 ENCOUNTER — Ambulatory Visit: Payer: Medicare HMO | Attending: Cardiology | Admitting: Cardiology

## 2023-02-08 ENCOUNTER — Encounter: Payer: Self-pay | Admitting: Cardiology

## 2023-02-08 VITALS — BP 120/74 | HR 72 | Ht 68.0 in | Wt 173.0 lb

## 2023-02-08 DIAGNOSIS — D6869 Other thrombophilia: Secondary | ICD-10-CM | POA: Diagnosis not present

## 2023-02-08 DIAGNOSIS — I442 Atrioventricular block, complete: Secondary | ICD-10-CM | POA: Diagnosis not present

## 2023-02-08 DIAGNOSIS — I480 Paroxysmal atrial fibrillation: Secondary | ICD-10-CM

## 2023-02-08 DIAGNOSIS — Z79899 Other long term (current) drug therapy: Secondary | ICD-10-CM | POA: Diagnosis not present

## 2023-02-08 LAB — CUP PACEART INCLINIC DEVICE CHECK
Battery Remaining Longevity: 42 mo
Battery Voltage: 2.96 V
Brady Statistic RA Percent Paced: 95 %
Brady Statistic RV Percent Paced: 99.95 %
Date Time Interrogation Session: 20240510123008
Implantable Lead Connection Status: 753985
Implantable Lead Connection Status: 753985
Implantable Lead Implant Date: 20181123
Implantable Lead Implant Date: 20181123
Implantable Lead Location: 753859
Implantable Lead Location: 753860
Implantable Pulse Generator Implant Date: 20181123
Lead Channel Impedance Value: 400 Ohm
Lead Channel Impedance Value: 412.5 Ohm
Lead Channel Pacing Threshold Amplitude: 0.75 V
Lead Channel Pacing Threshold Amplitude: 0.75 V
Lead Channel Pacing Threshold Amplitude: 1 V
Lead Channel Pacing Threshold Amplitude: 1 V
Lead Channel Pacing Threshold Pulse Width: 0.5 ms
Lead Channel Pacing Threshold Pulse Width: 0.5 ms
Lead Channel Pacing Threshold Pulse Width: 0.5 ms
Lead Channel Pacing Threshold Pulse Width: 0.5 ms
Lead Channel Sensing Intrinsic Amplitude: 1.7 mV
Lead Channel Sensing Intrinsic Amplitude: 12 mV
Lead Channel Setting Pacing Amplitude: 1 V
Lead Channel Setting Pacing Amplitude: 1.875
Lead Channel Setting Pacing Pulse Width: 0.5 ms
Lead Channel Setting Sensing Sensitivity: 2 mV
Pulse Gen Model: 2272
Pulse Gen Serial Number: 8957038

## 2023-02-08 NOTE — Progress Notes (Signed)
Electrophysiology Office Note   Date:  02/08/2023   ID:  Quade Ocanas, DOB 06-Feb-1929, MRN [id number]  PCP:  Noni Saupe, MD  Cardiologist:  Bing Matter Primary Electrophysiologist:  Shalayne Leach Jorja Loa, MD    Chief Complaint: pacemaker   History of Present Illness: Brandonmichael Ghent is a 89 y.o. male who is being seen today for the evaluation of pacemaker at the request of Noni Saupe, MD. Presenting today for electrophysiology evaluation.  He has a history of significant coronary artery disease post stenting x 2 in 2012, atrial fibrillation on Eliquis, complete heart block post [facility] Jude dual-chamber pacemaker.    Today, denies symptoms of palpitations, chest pain, shortness of breath, orthopnea, PND, lower extremity edema, claudication, dizziness, presyncope, syncope, bleeding, or neurologic sequela. The patient is tolerating medications without difficulties.  Since being seen he has done well.  Unfortunately his kidney function is continued to worsen.  He follows with nephrology.  Past Medical History:  Diagnosis Date   Acute kidney injury (AKI) with acute tubular necrosis (ATN) (HCC)    Cervical disc disease    Chronic kidney disease    Coronary artery disease    Gout    Hypertension    Nephrolithiasis    Pneumonia due to COVID-19 virus    Presence of stent in LAD coronary artery    Prostate cancer (HCC)    S/P placement of cardiac pacemaker    Urinary incontinence    Past Surgical History:  Procedure Laterality Date   ANKLE FRACTURE SURGERY Right    cardiac stents x2     caudal epidural injection     CERVICAL DISCECTOMY     COLONOSCOPY     COLONOSCOPY WITH PROPOFOL N/A 12/01/2022   Procedure: COLONOSCOPY WITH PROPOFOL;  Surgeon: Jeani Hawking, MD;  Location: [facility] ENDOSCOPY;  Service: Gastroenterology;  Laterality: N/A;   ESOPHAGOGASTRODUODENOSCOPY (EGD) WITH PROPOFOL N/A 11/30/2022   Procedure: ESOPHAGOGASTRODUODENOSCOPY (EGD) WITH PROPOFOL;  Surgeon:  Imogene Burn, MD;  Location: [facility] ENDOSCOPY;  Service: Gastroenterology;  Laterality: N/A;   EXPLORATORY LAPAROTOMY     HEMOSTASIS CLIP PLACEMENT  12/01/2022   Procedure: HEMOSTASIS CLIP PLACEMENT;  Surgeon: Jeani Hawking, MD;  Location: [facility] ENDOSCOPY;  Service: Gastroenterology;;   lumbar facet joint injection Bilateral    medical branch block Bilateral    POLYPECTOMY  12/01/2022   Procedure: POLYPECTOMY;  Surgeon: Jeani Hawking, MD;  Location: [facility] ENDOSCOPY;  Service: Gastroenterology;;   PROSTATECTOMY     RADIOFREQUENCY ABLATION Bilateral    ROTATOR CUFF REPAIR Right    SACROILIAC JOINT ARTHRODESIS Right    SACROILIAC JOINT ARTHRODESIS Bilateral    sacroilliac joint inj     SPIROMETRY       Current Outpatient Medications  Medication Sig Dispense Refill   acetaminophen (TYLENOL) 650 MG CR tablet Take 1,300 mg by mouth every evening.     amiodarone (PACERONE) 200 MG tablet Take 1 tablet (200 mg total) by mouth daily. 90 tablet 3   amLODipine (NORVASC) 5 MG tablet Take 10 mg by mouth daily.     apixaban (ELIQUIS) 2.5 MG TABS tablet Take 2.5 mg by mouth 2 (two) times daily.     atorvastatin (LIPITOR) 20 MG tablet Take 20 mg by mouth daily.     carvedilol (COREG) 12.5 MG tablet Take 1 tablet (12.5 mg total) by mouth 2 (two) times daily. 60 tablet 11   cyanocobalamin (VITAMIN B12) 1000 MCG tablet Take 1,000 mcg by mouth daily.  FIBER PO Take 1 tablet by mouth daily.     furosemide (LASIX) 40 MG tablet Take 1 tablet (40 mg total) by mouth daily. Please dont take lasix if weight is less than 181 pounds. 90 tablet 3   hydrALAZINE (APRESOLINE) 25 MG tablet Take 1.5 tablets (37.5 mg total) by mouth 3 (three) times daily. 405 tablet 3   isosorbide dinitrate (ISORDIL) 20 MG tablet Take 1 tablet (20 mg total) by mouth 3 (three) times daily. 270 tablet 3   Multiple Vitamins-Minerals (CENTRUM SILVER PO) Take by mouth.     Multiple Vitamins-Minerals (PRESERVISION AREDS 2) CAPS Take 1 capsule by mouth  3 (three) times daily.     potassium chloride (MICRO-K) 10 MEQ CR capsule Take 10 mEq by mouth daily.     VITAMIN D, CHOLECALCIFEROL, PO Take 500 mcg by mouth daily.     pantoprazole (PROTONIX) 40 MG tablet Take 1 tablet (40 mg total) by mouth 2 (two) times daily before a meal. 110 tablet 0   No current facility-administered medications for this visit.    Allergies:   Patient has no known allergies.   Social History:  The patient  reports that he has never smoked. He has never used smokeless tobacco. He reports that he does not currently use alcohol. He reports that he does not use drugs.   Family History:  The patient's family history includes Bone cancer in his father; CAD in his father; Diabetes in his mother; Heart disease in his sister; Heart failure in his mother; Lung cancer in his son; Obesity in his brother and mother.   ROS:  Please see the history of present illness.   Otherwise, review of systems is positive for none.   All other systems are reviewed and negative.   PHYSICAL EXAM: VS:  BP 120/74   Pulse 72   Ht 5\' 8"  (1.727 m)   Wt 173 lb (78.5 kg)   SpO2 98%   BMI 26.30 kg/m  , BMI Body mass index is 26.3 kg/m. GEN: Well nourished, well developed, in no acute distress  HEENT: normal  Neck: no JVD, carotid bruits, or masses Cardiac: RRR; no murmurs, rubs, or gallops,no edema  Respiratory:  clear to auscultation bilaterally, normal work of breathing GI: soft, nontender, nondistended, + BS MS: no deformity or atrophy  Skin: warm and dry, device site well healed Neuro:  Strength and sensation are intact Psych: euthymic mood, full affect  EKG:  EKG is not ordered today. Personal review of the ekg ordered 01/10/23 shows AV paced  Personal review of the device interrogation today. Results in Paceart   Recent Labs: 07/11/2022: ALT 16; TSH 1.960 11/28/2022: B Natriuretic Peptide 1,925.6 12/03/2022: Magnesium 2.1 12/12/2022: Hemoglobin 8.9; Platelets 260 01/10/2023: BUN 54;  Creatinine, Ser 3.97; Potassium 3.5; Sodium 142    Lipid Panel  No results found for: "CHOL", "TRIG", "HDL", "CHOLHDL", "VLDL", "LDLCALC", "LDLDIRECT"   Wt Readings from Last 3 Encounters:  02/08/23 173 lb (78.5 kg)  01/16/23 185 lb 9.6 oz (84.2 kg)  01/10/23 185 lb 9.6 oz (84.2 kg)      Other studies Reviewed: Additional studies/ records that were reviewed today include: TTE 08/06/2019 Review of the above records today demonstrates: Overall normal systolic function with mild LVH Normal right ventricular size and function, mild RVH Aortic atherosclerosis   ASSESSMENT AND PLAN:  1.  Complete heart block: Status post [employer] Jude dual-chamber pacemaker implanted in 2018.  Device functioning appropriately.  Sensing, threshold, impedance within normal  limits.  No changes.  2.  Atrial fibrillation: Currently on amiodarone and Eliquis.  CHA2DS2-VASc of at least 4.  Noted on device interrogation  3.  Coronary artery disease: Status post stents x 2 in 2012.  No current chest pain.  4.  Hypertension: Currently well-controlled  5.  Secondary hypercoagulable state: Currently on Eliquis for atrial fibrillation.  6.  High risk medication monitoring: Currently on amiodarone.  Ashika Apuzzo check CMP and TSH today.   Current medicines are reviewed at length with the patient today.   The patient does not have concerns regarding his medicines.  The following changes were made today: None  Labs/ tests ordered today include:  Orders Placed This Encounter  Procedures   TSH   Hepatic function panel   CUP PACEART INCLINIC DEVICE CHECK      Disposition:   FU 1 year  Signed, Byrd Terrero Jorja Loa, MD  02/08/2023 12:47 PM     [facility] [address] [place] [address] [phone] (office) [phone] (fax)

## 2023-02-08 NOTE — Patient Instructions (Signed)
Medication Instructions:  Your physician recommends that you continue on your current medications as directed. Please refer to the Current Medication list given to you today.  *If you need a refill on your cardiac medications before your next appointment, please call your pharmacy*   Lab Work: Amiodarone surveillance labs today: TSH & LFTs.  If you have labs (blood work) drawn today and your tests are completely normal, you will receive your results only by: MyChart Message (if you have MyChart) OR A paper copy in the mail If you have any lab test that is abnormal or we need to change your treatment, we will call you to review the results.   Testing/Procedures: None ordered   Follow-Up: At [facility], you and your health needs are our priority.  As part of our continuing mission to provide you with exceptional heart care, we have created designated Provider Care Teams.  These Care Teams include your primary Cardiologist (physician) and Advanced Practice Providers (APPs -  Physician Assistants and Nurse Practitioners) who all work together to provide you with the care you need, when you need it.   Remote monitoring is used to monitor your Pacemaker or ICD from home. This monitoring reduces the number of office visits required to check your device to one time per year. It allows [place] to keep an eye on the functioning of your device to ensure it is working properly. You are scheduled for a device check from home on 5/13, 8/12. You may send your transmission at any time that day. If you have a wireless device, the transmission will be sent automatically. After your physician reviews your transmission, you will receive a postcard with your next transmission date.  Your next appointment:   1 year(s)  The format for your next appointment:   In Person  Provider:   You will see one of the following Advanced Practice Providers on your designated Care Team:   Francis Dowse, [place] Casimiro Needle "Mardelle Matte"  Lanna Poche, [place]   Thank you for choosing [facility]!!   Dory Horn, RN [phone]

## 2023-02-09 LAB — HEPATIC FUNCTION PANEL
ALT: 12 IU/L (ref 0–44)
AST: 17 IU/L (ref 0–40)
Albumin: 4.4 g/dL (ref 3.6–4.6)
Alkaline Phosphatase: 87 IU/L (ref 44–121)
Bilirubin Total: 0.5 mg/dL (ref 0.0–1.2)
Bilirubin, Direct: 0.19 mg/dL (ref 0.00–0.40)
Total Protein: 6.7 g/dL (ref 6.0–8.5)

## 2023-02-09 LAB — TSH: TSH: 2.26 u[IU]/mL (ref 0.450–4.500)

## 2023-02-11 ENCOUNTER — Ambulatory Visit (INDEPENDENT_AMBULATORY_CARE_PROVIDER_SITE_OTHER): Payer: Medicare HMO

## 2023-02-11 DIAGNOSIS — I442 Atrioventricular block, complete: Secondary | ICD-10-CM

## 2023-02-11 LAB — CUP PACEART REMOTE DEVICE CHECK
Battery Remaining Longevity: 43 mo
Battery Remaining Percentage: 41 %
Battery Voltage: 2.96 V
Brady Statistic AP VP Percent: 99 %
Brady Statistic AP VS Percent: 1 %
Brady Statistic AS VP Percent: 1 %
Brady Statistic AS VS Percent: 1 %
Brady Statistic RA Percent Paced: 99 %
Brady Statistic RV Percent Paced: 99 %
Date Time Interrogation Session: 20240513035807
Implantable Lead Connection Status: 753985
Implantable Lead Connection Status: 753985
Implantable Lead Implant Date: 20181123
Implantable Lead Implant Date: 20181123
Implantable Lead Location: 753859
Implantable Lead Location: 753860
Implantable Pulse Generator Implant Date: 20181123
Lead Channel Impedance Value: 380 Ohm
Lead Channel Impedance Value: 380 Ohm
Lead Channel Pacing Threshold Amplitude: 0.75 V
Lead Channel Pacing Threshold Amplitude: 0.875 V
Lead Channel Pacing Threshold Pulse Width: 0.5 ms
Lead Channel Pacing Threshold Pulse Width: 0.5 ms
Lead Channel Sensing Intrinsic Amplitude: 1.7 mV
Lead Channel Sensing Intrinsic Amplitude: 12 mV
Lead Channel Setting Pacing Amplitude: 1 V
Lead Channel Setting Pacing Amplitude: 1.875
Lead Channel Setting Pacing Pulse Width: 0.5 ms
Lead Channel Setting Sensing Sensitivity: 2 mV
Pulse Gen Model: 2272
Pulse Gen Serial Number: 8957038

## 2023-02-13 DIAGNOSIS — D0462 Carcinoma in situ of skin of left upper limb, including shoulder: Secondary | ICD-10-CM | POA: Diagnosis not present

## 2023-02-18 ENCOUNTER — Other Ambulatory Visit (HOSPITAL_COMMUNITY): Payer: Self-pay | Admitting: Cardiology

## 2023-02-18 DIAGNOSIS — D631 Anemia in chronic kidney disease: Secondary | ICD-10-CM | POA: Diagnosis not present

## 2023-02-18 DIAGNOSIS — N184 Chronic kidney disease, stage 4 (severe): Secondary | ICD-10-CM | POA: Diagnosis not present

## 2023-02-18 DIAGNOSIS — I130 Hypertensive heart and chronic kidney disease with heart failure and stage 1 through stage 4 chronic kidney disease, or unspecified chronic kidney disease: Secondary | ICD-10-CM | POA: Diagnosis not present

## 2023-02-18 DIAGNOSIS — I509 Heart failure, unspecified: Secondary | ICD-10-CM | POA: Diagnosis not present

## 2023-02-18 DIAGNOSIS — N2581 Secondary hyperparathyroidism of renal origin: Secondary | ICD-10-CM | POA: Diagnosis not present

## 2023-02-18 DIAGNOSIS — K922 Gastrointestinal hemorrhage, unspecified: Secondary | ICD-10-CM | POA: Diagnosis not present

## 2023-02-22 LAB — LAB REPORT - SCANNED: EGFR: 14

## 2023-03-06 NOTE — Progress Notes (Signed)
Remote pacemaker transmission.   

## 2023-03-14 ENCOUNTER — Other Ambulatory Visit: Payer: Self-pay | Admitting: Cardiology

## 2023-04-01 DIAGNOSIS — M25552 Pain in left hip: Secondary | ICD-10-CM | POA: Diagnosis not present

## 2023-04-01 DIAGNOSIS — D631 Anemia in chronic kidney disease: Secondary | ICD-10-CM | POA: Diagnosis not present

## 2023-04-01 DIAGNOSIS — N2581 Secondary hyperparathyroidism of renal origin: Secondary | ICD-10-CM | POA: Diagnosis not present

## 2023-04-01 DIAGNOSIS — I509 Heart failure, unspecified: Secondary | ICD-10-CM | POA: Diagnosis not present

## 2023-04-01 DIAGNOSIS — N184 Chronic kidney disease, stage 4 (severe): Secondary | ICD-10-CM | POA: Diagnosis not present

## 2023-04-01 DIAGNOSIS — W19XXXA Unspecified fall, initial encounter: Secondary | ICD-10-CM | POA: Diagnosis not present

## 2023-04-01 DIAGNOSIS — I130 Hypertensive heart and chronic kidney disease with heart failure and stage 1 through stage 4 chronic kidney disease, or unspecified chronic kidney disease: Secondary | ICD-10-CM | POA: Diagnosis not present

## 2023-04-08 DIAGNOSIS — W19XXXA Unspecified fall, initial encounter: Secondary | ICD-10-CM | POA: Diagnosis not present

## 2023-04-08 DIAGNOSIS — M5418 Radiculopathy, sacral and sacrococcygeal region: Secondary | ICD-10-CM | POA: Diagnosis not present

## 2023-04-08 DIAGNOSIS — R059 Cough, unspecified: Secondary | ICD-10-CM | POA: Diagnosis not present

## 2023-04-09 DIAGNOSIS — N189 Chronic kidney disease, unspecified: Secondary | ICD-10-CM | POA: Diagnosis not present

## 2023-04-11 DIAGNOSIS — Z1331 Encounter for screening for depression: Secondary | ICD-10-CM | POA: Diagnosis not present

## 2023-04-11 DIAGNOSIS — D509 Iron deficiency anemia, unspecified: Secondary | ICD-10-CM | POA: Diagnosis not present

## 2023-04-11 DIAGNOSIS — N184 Chronic kidney disease, stage 4 (severe): Secondary | ICD-10-CM | POA: Diagnosis not present

## 2023-04-11 DIAGNOSIS — J189 Pneumonia, unspecified organism: Secondary | ICD-10-CM | POA: Diagnosis not present

## 2023-04-11 DIAGNOSIS — Z1339 Encounter for screening examination for other mental health and behavioral disorders: Secondary | ICD-10-CM | POA: Diagnosis not present

## 2023-04-11 DIAGNOSIS — Z6826 Body mass index (BMI) 26.0-26.9, adult: Secondary | ICD-10-CM | POA: Diagnosis not present

## 2023-04-11 DIAGNOSIS — Z9181 History of falling: Secondary | ICD-10-CM | POA: Diagnosis not present

## 2023-04-11 DIAGNOSIS — Z0000 Encounter for general adult medical examination without abnormal findings: Secondary | ICD-10-CM | POA: Diagnosis not present

## 2023-04-12 DIAGNOSIS — Z0000 Encounter for general adult medical examination without abnormal findings: Secondary | ICD-10-CM | POA: Diagnosis not present

## 2023-04-26 DIAGNOSIS — N189 Chronic kidney disease, unspecified: Secondary | ICD-10-CM | POA: Diagnosis not present

## 2023-04-30 DIAGNOSIS — N189 Chronic kidney disease, unspecified: Secondary | ICD-10-CM | POA: Diagnosis not present

## 2023-05-08 ENCOUNTER — Encounter: Payer: Self-pay | Admitting: Internal Medicine

## 2023-05-08 ENCOUNTER — Ambulatory Visit: Payer: Medicare HMO | Admitting: Internal Medicine

## 2023-05-08 ENCOUNTER — Other Ambulatory Visit: Payer: Medicare HMO

## 2023-05-08 VITALS — BP 120/60 | HR 58 | Ht 69.0 in | Wt 172.0 lb

## 2023-05-08 DIAGNOSIS — K921 Melena: Secondary | ICD-10-CM | POA: Diagnosis not present

## 2023-05-08 DIAGNOSIS — Z8719 Personal history of other diseases of the digestive system: Secondary | ICD-10-CM | POA: Diagnosis not present

## 2023-05-08 DIAGNOSIS — R195 Other fecal abnormalities: Secondary | ICD-10-CM | POA: Diagnosis not present

## 2023-05-08 DIAGNOSIS — D649 Anemia, unspecified: Secondary | ICD-10-CM

## 2023-05-08 DIAGNOSIS — Z86010 Personal history of colonic polyps: Secondary | ICD-10-CM

## 2023-05-08 LAB — IBC + FERRITIN
Ferritin: 222.5 ng/mL (ref 22.0–322.0)
Iron: 45 ug/dL (ref 42–165)
Saturation Ratios: 19.8 % — ABNORMAL LOW (ref 20.0–50.0)
TIBC: 226.8 ug/dL — ABNORMAL LOW (ref 250.0–450.0)
Transferrin: 162 mg/dL — ABNORMAL LOW (ref 212.0–360.0)

## 2023-05-08 LAB — CBC WITH DIFFERENTIAL/PLATELET
Basophils Absolute: 0.1 10*3/uL (ref 0.0–0.1)
Basophils Relative: 0.9 % (ref 0.0–3.0)
Eosinophils Absolute: 0.1 10*3/uL (ref 0.0–0.7)
Eosinophils Relative: 2.4 % (ref 0.0–5.0)
HCT: 28.3 % — ABNORMAL LOW (ref 39.0–52.0)
Hemoglobin: 9.1 g/dL — ABNORMAL LOW (ref 13.0–17.0)
Lymphocytes Relative: 21.5 % (ref 12.0–46.0)
Lymphs Abs: 1.3 10*3/uL (ref 0.7–4.0)
MCHC: 32.1 g/dL (ref 30.0–36.0)
MCV: 90.7 fl (ref 78.0–100.0)
Monocytes Absolute: 0.6 10*3/uL (ref 0.1–1.0)
Monocytes Relative: 10.4 % (ref 3.0–12.0)
Neutro Abs: 3.8 10*3/uL (ref 1.4–7.7)
Neutrophils Relative %: 64.8 % (ref 43.0–77.0)
Platelets: 194 10*3/uL (ref 150.0–400.0)
RBC: 3.12 Mil/uL — ABNORMAL LOW (ref 4.22–5.81)
RDW: 17.8 % — ABNORMAL HIGH (ref 11.5–15.5)
WBC: 5.9 10*3/uL (ref 4.0–10.5)

## 2023-05-08 LAB — H. PYLORI ANTIBODY, IGG: H Pylori IgG: NEGATIVE

## 2023-05-08 NOTE — Patient Instructions (Addendum)
Your provider has requested that you go to the basement level for lab work before leaving today. Press "B" on the elevator. The lab is located at the first door on the left as you exit the elevator.   If your blood pressure at your visit was 140/90 or greater, please contact your primary care physician to follow up on this.  _______________________________________________________  If you are age 87 or older, your body mass index should be between 23-30. Your Body mass index is 25.4 kg/m. If this is out of the aforementioned range listed, please consider follow up with your Primary Care Provider.  If you are age 47 or younger, your body mass index should be between 19-25. Your Body mass index is 25.4 kg/m. If this is out of the aformentioned range listed, please consider follow up with your Primary Care Provider.   ________________________________________________________  The [facility] providers would like to encourage you to use [facility] to communicate with providers for non-urgent requests or questions.  Due to long hold times on the telephone, sending your provider a message by [facility] may be a faster and more efficient way to get a response.  Please allow 48 business hours for a response.  Please remember that this is for non-urgent requests.  _______________________________________________________   Due to recent changes in healthcare laws, you may see the results of your imaging and laboratory studies on MyChart before your provider has had a chance to review them.  We understand that in some cases there may be results that are confusing or concerning to you. Not all laboratory results come back in the same time frame and the provider may be waiting for multiple results in order to interpret others.  Please give [place] 48 hours in order for your provider to thoroughly review all the results before contacting the office for clarification of your results.    Thank you for entrusting me with your  care and for choosing [facility],  Dr. Eulah Pont

## 2023-05-08 NOTE — Progress Notes (Signed)
Chief Complaint: Melena  HPI : 89 year old male with history of CAD s/p PCI, HFrEF (EF 30-35%), complete heart block s/p PM, A-fib on Eliquis, CKD, prostate cancer s/p prostatectomy, prior SBO presents with dark stools.   Patient was recently admitted 2/28 to 12/04/2022 for shortness of breath, suspected to be due to a heart failure exacerbation as well as possible symptomatic anemia.  He underwent EGD and colonoscopy during that hospitalization that showed gastritis and removal of 19-20 colon polyps.  He was given IV iron during that hospitalization.  He is still having black stools. Denies any sticky or tarry stools. Patient was told that there is blood in the stools when he was tested with a stool test just a few days ago. He is still taking iron supplements. Denies hematochezia. Denies abdominal pain, N&V, and constipation. He had diarrhea a few days back but this has resolved already. He eats and drinks well. He is still on Eliquis therapy. He has had several bowel obstructions in the past but to his knowledge she has never had a portion of his colon resected.  Wt Readings from Last 3 Encounters:  05/08/23 172 lb (78 kg)  02/08/23 173 lb (78.5 kg)  01/16/23 185 lb 9.6 oz (84.2 kg)   Past Medical History:  Diagnosis Date   Acute kidney injury (AKI) with acute tubular necrosis (ATN) (HCC)    Cervical disc disease    Chronic kidney disease    Coronary artery disease    Gout    Hypertension    Nephrolithiasis    Pneumonia due to COVID-19 virus    Presence of stent in LAD coronary artery    Prostate cancer (HCC)    S/P placement of cardiac pacemaker    Urinary incontinence      Past Surgical History:  Procedure Laterality Date   ANKLE FRACTURE SURGERY Right    cardiac stents x2     caudal epidural injection     CERVICAL DISCECTOMY     COLONOSCOPY     COLONOSCOPY WITH PROPOFOL N/A 12/01/2022   Procedure: COLONOSCOPY WITH PROPOFOL;  Surgeon: Jeani Hawking, MD;  Location: [facility]  ENDOSCOPY;  Service: Gastroenterology;  Laterality: N/A;   ESOPHAGOGASTRODUODENOSCOPY (EGD) WITH PROPOFOL N/A 11/30/2022   Procedure: ESOPHAGOGASTRODUODENOSCOPY (EGD) WITH PROPOFOL;  Surgeon: Imogene Burn, MD;  Location[facility] ENDOSCOPY;  Service: Gastroenterology;  Laterality: N/A;   EXPLORATORY LAPAROTOMY     HEMOSTASIS CLIP PLACEMENT  12/01/2022   Procedure: HEMOSTASIS CLIP PLACEMENT;  Surgeon: Jeani Hawking, MD;  Location: [facility] ENDOSCOPY;  Service: Gastroenterology;;   lumbar facet joint injection Bilateral    medical branch block Bilateral    POLYPECTOMY  12/01/2022   Procedure: POLYPECTOMY;  Surgeon: Jeani Hawking, MD;  Location: [facility] ENDOSCOPY;  Service: Gastroenterology;;   PROSTATECTOMY     RADIOFREQUENCY ABLATION Bilateral    ROTATOR CUFF REPAIR Right    SACROILIAC JOINT ARTHRODESIS Right    SACROILIAC JOINT ARTHRODESIS Bilateral    sacroilliac joint inj     SPIROMETRY     Family History  Problem Relation Age of Onset   Diabetes Mother    Obesity Mother    Heart failure Mother    CAD Father    Bone cancer Father    Heart disease Sister    Obesity Brother    Lung cancer Son    Social History   Tobacco Use   Smoking status: Never   Smokeless tobacco: Never  Vaping Use   Vaping status: Never Used  Substance Use Topics   Alcohol use: Not Currently    Comment: past   Drug use: Never   Current Outpatient Medications  Medication Sig Dispense Refill   acetaminophen (TYLENOL) 650 MG CR tablet Take 1,300 mg by mouth every evening.     amiodarone (PACERONE) 200 MG tablet Take 1 tablet (200 mg total) by mouth daily. 90 tablet 3   amLODipine (NORVASC) 5 MG tablet Take 10 mg by mouth daily.     apixaban (ELIQUIS) 2.5 MG TABS tablet Take 2.5 mg by mouth 2 (two) times daily.     atorvastatin (LIPITOR) 20 MG tablet Take 20 mg by mouth daily.     carvedilol (COREG) 12.5 MG tablet Take 1 tablet (12.5 mg total) by mouth 2 (two) times daily. 60 tablet 11   cyanocobalamin (VITAMIN B12) 1000  MCG tablet Take 1,000 mcg by mouth daily.     ferrous sulfate 325 (65 FE) MG EC tablet Take 325 mg by mouth 3 (three) times a week.     FIBER PO Take 1 tablet by mouth daily.     furosemide (LASIX) 40 MG tablet Take 1 tablet (40 mg total) by mouth daily. Please dont take lasix if weight is less than 181 pounds. 90 tablet 3   hydrALAZINE (APRESOLINE) 25 MG tablet TAKE 1 AND 1/2 TABLETS THREE TIMES DAILY 405 tablet 3   isosorbide dinitrate (ISORDIL) 20 MG tablet TAKE 1 TABLET THREE TIMES DAILY 270 tablet 3   Multiple Vitamins-Minerals (CENTRUM SILVER PO) Take by mouth.     Multiple Vitamins-Minerals (PRESERVISION AREDS 2) CAPS Take 1 capsule by mouth 3 (three) times daily.     potassium chloride (MICRO-K) 10 MEQ CR capsule Take 10 mEq by mouth daily.     VITAMIN D, CHOLECALCIFEROL, PO Take 500 mcg by mouth daily.     pantoprazole (PROTONIX) 40 MG tablet Take 1 tablet (40 mg total) by mouth 2 (two) times daily before a meal. 110 tablet 0   No current facility-administered medications for this visit.   No Known Allergies   Review of Systems: All systems reviewed and negative except where noted in HPI.   Physical Exam: BP 120/60   Pulse (!) 58   Ht 5\' 9"  (1.753 m)   Wt 172 lb (78 kg)   BMI 25.40 kg/m  Constitutional: Pleasant,well-developed, male in no acute distress. HEENT: Normocephalic and atraumatic. Conjunctivae are normal. No scleral icterus. Cardiovascular: Mildly bradycardic Pulmonary/chest: Effort normal Abdominal: Soft, nondistended, nontender. Extremities: No edema Neurological: Alert and oriented to person place and time. Skin: Skin is warm and dry. No rashes noted. Psychiatric: Normal mood and affect. Behavior is normal.  Labs 11/2022: CBC with low Hb of 8.9  Labs 01/2023: Hb low at 11.8. TSH nml. LFTs nml. BMP with elevated Cr of 3.98 and GFR of 14. Iron studies normal. Vit D low  Labs 04/2023: CBC with low Hb 9.5  EGD 11/30/22: - Normal esophagus.  - A single gastric  polyp.  - Gastritis.  - Normal examined duodenum.  - No specimens collected.  Colonoscopy 12/01/22: - 19- 20 3 to 20 mm polyps in the sigmoid colon, in the descending colon, in the ascending colon and in the cecum, removed with a cold snare. Resected and retrieved. Clip manufacturer: [employer]. Clips ( MR safe) were placed. Path: A. COLON, DESCENDING, POLYPECTOMY:  - TUBULAR ADENOMA(S), WITH FOCAL HIGH-GRADE DYSPLASIA (MULTIPLE  FRAGMENTS)  - NEGATIVE FOR MALIGNANCY  B. COLON, ASCENDING AND CECAL, POLYPECTOMY:  -  TUBULAR ADENOMA(S) (3 FRAGMENTS)  - NEGATIVE FOR HIGH-GRADE DYSPLASIA OR MALIGNANCY  C. COLON, SIGMOID, POLYPECTOMY:  - TUBULOVILLOUS ADENOMA (1 FRAGMENT)  - NEGATIVE FOR HIGH-GRADE DYSPLASIA OR MALIGNANCY   ASSESSMENT AND PLAN: Possible melena Mild anemia Positive FOBT Prior SBO History of colon polyps Patient presents for anemia that has been followed with his nephrologist.  Originally I believe that his blood counts have been uptrending because the last hemoglobin I saw was 11.8 from 02/18/2023.  However after discussion with the patient's son today, he informed me that his father's most recent blood count was 9.5 last week.  Thus it is possible that the patient is having ongoing GI blood loss.  He is currently on iron supplements, which could explain his black stools.  However he also recently had a stool test that was positive for fecal occult blood.  My plan is to recheck his blood counts and iron levels today.  Will also check him for H. pylori and celiac disease.  Unfortunately he is not a candidate for VCE in the future due to his history of small bowel obstructions.  Could consider SBE in the future, but the benefits of this procedure would have to be weighed against the risk since the patient does have significant cardiac history with severe heart failure as well as risks of sedation due to his advanced age.  Once his lab results come back, we will plan to discuss  this further with the patient's son again at that time. - Check CBC, ferritin/IBC, TTG IgA, IgA - Check serum H pylori antibody - Would not be a good candidate for VCE due to his history of SBO - Could consider SBE in the hospital for further evaluation - Called his son Davanta Juergensen III to discuss plan [phone].  Will plan to readdress plan after labs come back  Eulah Pont, MD  I spent 44 minutes of time, including in depth chart review, independent review of results as outlined above, communicating results with the patient directly, face-to-face time with the patient, coordinating care, ordering studies and medications as appropriate, and documentation.

## 2023-05-10 NOTE — Progress Notes (Signed)
Spoke to [employer] III (the patient's son) about the results of his father's labs, which showed that his hemoglobin has downtrended slightly compared to the labs drawn with his nephrologist. I told the patient's son that I would be willing to offer a SBE as a next step in work up, but that with his cardiac issues and advanced age, this procedure would be considered higher risk. He is not a candidate for VCE due to history of bowel obstructions. Patient's son told me that the patient would prefer to be conservative at this time. He will continue to get his blood counts checked with his nephrologist. I did offer a hematology referral but they declined at this time, stating that he underwent a course of IV iron infusions a few months ago. The patient's son would prefer to get his father a follow up appointment, which he will plan to go to as well.  Morrison Old, please call the patient's son at [phone] to schedule a follow up appointment for patient with me in 2-3 months for IDA. Thanks.

## 2023-05-13 ENCOUNTER — Ambulatory Visit (INDEPENDENT_AMBULATORY_CARE_PROVIDER_SITE_OTHER): Payer: Medicare HMO

## 2023-05-13 DIAGNOSIS — I442 Atrioventricular block, complete: Secondary | ICD-10-CM | POA: Diagnosis not present

## 2023-05-13 LAB — CUP PACEART REMOTE DEVICE CHECK
Battery Remaining Longevity: 41 mo
Battery Remaining Percentage: 38 %
Battery Voltage: 2.95 V
Brady Statistic AP VP Percent: 98 %
Brady Statistic AP VS Percent: 1 %
Brady Statistic AS VP Percent: 1.6 %
Brady Statistic AS VS Percent: 1 %
Brady Statistic RA Percent Paced: 98 %
Brady Statistic RV Percent Paced: 99 %
Date Time Interrogation Session: 20240812020013
Implantable Lead Connection Status: 753985
Implantable Lead Connection Status: 753985
Implantable Lead Implant Date: 20181123
Implantable Lead Implant Date: 20181123
Implantable Lead Location: 753859
Implantable Lead Location: 753860
Implantable Pulse Generator Implant Date: 20181123
Lead Channel Impedance Value: 380 Ohm
Lead Channel Impedance Value: 380 Ohm
Lead Channel Pacing Threshold Amplitude: 0.75 V
Lead Channel Pacing Threshold Amplitude: 0.875 V
Lead Channel Pacing Threshold Pulse Width: 0.5 ms
Lead Channel Pacing Threshold Pulse Width: 0.5 ms
Lead Channel Sensing Intrinsic Amplitude: 1.3 mV
Lead Channel Sensing Intrinsic Amplitude: 12 mV
Lead Channel Setting Pacing Amplitude: 1 V
Lead Channel Setting Pacing Amplitude: 1.875
Lead Channel Setting Pacing Pulse Width: 0.5 ms
Lead Channel Setting Sensing Sensitivity: 2 mV
Pulse Gen Model: 2272
Pulse Gen Serial Number: 8957038

## 2023-05-15 ENCOUNTER — Telehealth: Payer: Self-pay | Admitting: Cardiology

## 2023-05-15 DIAGNOSIS — K922 Gastrointestinal hemorrhage, unspecified: Secondary | ICD-10-CM | POA: Diagnosis not present

## 2023-05-15 DIAGNOSIS — I509 Heart failure, unspecified: Secondary | ICD-10-CM | POA: Diagnosis not present

## 2023-05-15 DIAGNOSIS — I130 Hypertensive heart and chronic kidney disease with heart failure and stage 1 through stage 4 chronic kidney disease, or unspecified chronic kidney disease: Secondary | ICD-10-CM | POA: Diagnosis not present

## 2023-05-15 DIAGNOSIS — N184 Chronic kidney disease, stage 4 (severe): Secondary | ICD-10-CM | POA: Diagnosis not present

## 2023-05-15 DIAGNOSIS — D631 Anemia in chronic kidney disease: Secondary | ICD-10-CM | POA: Diagnosis not present

## 2023-05-15 DIAGNOSIS — N2581 Secondary hyperparathyroidism of renal origin: Secondary | ICD-10-CM | POA: Diagnosis not present

## 2023-05-15 DIAGNOSIS — N185 Chronic kidney disease, stage 5: Secondary | ICD-10-CM | POA: Diagnosis not present

## 2023-05-15 NOTE — Telephone Encounter (Signed)
Returned call, no answer, left message per DPR asking them to call back.

## 2023-05-15 NOTE — Telephone Encounter (Signed)
Pt c/o medication issue:  1. Name of Medication: apixaban (ELIQUIS) 2.5 MG TABS tablet   2. How are you currently taking this medication (dosage and times per day)?   3. Are you having a reaction (difficulty breathing--STAT)?   4. What is your medication issue? Patient would like a call back to discuss this medication and pricing. States price has doubled. He would also like to discuss reducing dose due to lower BP readings.

## 2023-05-17 NOTE — Telephone Encounter (Signed)
Can not decrease dose of Eliquis any further. He is dosed appropriate for his age and renal function. If unaffordable, recommend applying for patient assistance. Recommend office visit to discuss low BP symptoms

## 2023-05-20 NOTE — Telephone Encounter (Signed)
Spoke to patient about his concerns with eliquis. Patient states he has no concerns with his blood pressures, he is just concerned about cost. Advised that he may qualify for patient assistance, patient requests to have application mailed to his home, application mailed.

## 2023-05-24 DIAGNOSIS — D0462 Carcinoma in situ of skin of left upper limb, including shoulder: Secondary | ICD-10-CM | POA: Diagnosis not present

## 2023-05-24 DIAGNOSIS — L570 Actinic keratosis: Secondary | ICD-10-CM | POA: Diagnosis not present

## 2023-05-27 ENCOUNTER — Other Ambulatory Visit (HOSPITAL_COMMUNITY): Payer: Self-pay | Admitting: *Deleted

## 2023-05-27 NOTE — Progress Notes (Signed)
Remote pacemaker transmission.   

## 2023-05-28 ENCOUNTER — Encounter (HOSPITAL_COMMUNITY)
Admission: RE | Admit: 2023-05-28 | Discharge: 2023-05-28 | Disposition: A | Discharge status: Home / Self Care | Payer: Medicare HMO | Source: Ambulatory Visit | Attending: Nephrology | Admitting: Nephrology

## 2023-05-28 DIAGNOSIS — D631 Anemia in chronic kidney disease: Secondary | ICD-10-CM | POA: Diagnosis not present

## 2023-05-28 DIAGNOSIS — N189 Chronic kidney disease, unspecified: Secondary | ICD-10-CM | POA: Insufficient documentation

## 2023-05-28 MED ORDER — SODIUM CHLORIDE 0.9 % IV SOLN
200.0000 mg | INTRAVENOUS | Status: DC
  Administered 2023-05-28: 200 mg via INTRAVENOUS
  Filled 2023-05-28: qty 10

## 2023-06-04 ENCOUNTER — Encounter (HOSPITAL_COMMUNITY)
Admission: RE | Admit: 2023-06-04 | Discharge: 2023-06-04 | Disposition: A | Discharge status: Home / Self Care | Payer: Medicare HMO | Source: Ambulatory Visit | Attending: Nephrology | Admitting: Nephrology

## 2023-06-04 DIAGNOSIS — N189 Chronic kidney disease, unspecified: Secondary | ICD-10-CM | POA: Diagnosis not present

## 2023-06-04 DIAGNOSIS — D631 Anemia in chronic kidney disease: Secondary | ICD-10-CM | POA: Insufficient documentation

## 2023-06-04 MED ORDER — SODIUM CHLORIDE 0.9 % IV SOLN
200.0000 mg | INTRAVENOUS | Status: DC
  Administered 2023-06-04: 200 mg via INTRAVENOUS
  Filled 2023-06-04: qty 200

## 2023-06-11 ENCOUNTER — Encounter (HOSPITAL_COMMUNITY)
Admission: RE | Admit: 2023-06-11 | Discharge: 2023-06-11 | Disposition: A | Discharge status: Home / Self Care | Payer: Medicare HMO | Source: Ambulatory Visit | Attending: Nephrology

## 2023-06-11 DIAGNOSIS — N189 Chronic kidney disease, unspecified: Secondary | ICD-10-CM | POA: Diagnosis not present

## 2023-06-11 DIAGNOSIS — D631 Anemia in chronic kidney disease: Secondary | ICD-10-CM | POA: Diagnosis not present

## 2023-06-11 MED ORDER — SODIUM CHLORIDE 0.9 % IV SOLN
200.0000 mg | INTRAVENOUS | Status: DC
  Administered 2023-06-11: 200 mg via INTRAVENOUS
  Filled 2023-06-11: qty 200

## 2023-06-18 ENCOUNTER — Ambulatory Visit (HOSPITAL_COMMUNITY)
Admission: RE | Admit: 2023-06-18 | Discharge: 2023-06-18 | Disposition: A | Discharge status: Home / Self Care | Payer: Medicare HMO | Source: Ambulatory Visit | Attending: Nephrology | Admitting: Nephrology

## 2023-06-18 DIAGNOSIS — N189 Chronic kidney disease, unspecified: Secondary | ICD-10-CM | POA: Insufficient documentation

## 2023-06-18 DIAGNOSIS — D631 Anemia in chronic kidney disease: Secondary | ICD-10-CM | POA: Insufficient documentation

## 2023-06-18 MED ORDER — SODIUM CHLORIDE 0.9 % IV SOLN
200.0000 mg | INTRAVENOUS | Status: DC
  Administered 2023-06-18: 200 mg via INTRAVENOUS
  Filled 2023-06-18: qty 200

## 2023-06-25 ENCOUNTER — Ambulatory Visit (HOSPITAL_COMMUNITY)
Admission: RE | Admit: 2023-06-25 | Discharge: 2023-06-25 | Disposition: A | Discharge status: Home / Self Care | Payer: Medicare HMO | Source: Ambulatory Visit | Attending: Nephrology

## 2023-06-25 DIAGNOSIS — N189 Chronic kidney disease, unspecified: Secondary | ICD-10-CM | POA: Diagnosis not present

## 2023-06-25 DIAGNOSIS — D631 Anemia in chronic kidney disease: Secondary | ICD-10-CM | POA: Diagnosis not present

## 2023-06-25 MED ORDER — SODIUM CHLORIDE 0.9 % IV SOLN
200.0000 mg | INTRAVENOUS | Status: DC
  Administered 2023-06-25: 200 mg via INTRAVENOUS
  Filled 2023-06-25: qty 200

## 2023-07-01 DIAGNOSIS — N2581 Secondary hyperparathyroidism of renal origin: Secondary | ICD-10-CM | POA: Diagnosis not present

## 2023-07-01 DIAGNOSIS — N185 Chronic kidney disease, stage 5: Secondary | ICD-10-CM | POA: Diagnosis not present

## 2023-07-01 DIAGNOSIS — I509 Heart failure, unspecified: Secondary | ICD-10-CM | POA: Diagnosis not present

## 2023-07-01 DIAGNOSIS — D631 Anemia in chronic kidney disease: Secondary | ICD-10-CM | POA: Diagnosis not present

## 2023-07-01 DIAGNOSIS — I130 Hypertensive heart and chronic kidney disease with heart failure and stage 1 through stage 4 chronic kidney disease, or unspecified chronic kidney disease: Secondary | ICD-10-CM | POA: Diagnosis not present

## 2023-07-05 DIAGNOSIS — R051 Acute cough: Secondary | ICD-10-CM | POA: Diagnosis not present

## 2023-07-05 DIAGNOSIS — R0981 Nasal congestion: Secondary | ICD-10-CM | POA: Diagnosis not present

## 2023-07-05 DIAGNOSIS — J209 Acute bronchitis, unspecified: Secondary | ICD-10-CM | POA: Diagnosis not present

## 2023-07-05 DIAGNOSIS — J019 Acute sinusitis, unspecified: Secondary | ICD-10-CM | POA: Diagnosis not present

## 2023-07-05 DIAGNOSIS — R509 Fever, unspecified: Secondary | ICD-10-CM | POA: Diagnosis not present

## 2023-07-17 ENCOUNTER — Emergency Department (HOSPITAL_COMMUNITY): Payer: Medicare HMO

## 2023-07-17 ENCOUNTER — Other Ambulatory Visit: Payer: Self-pay

## 2023-07-17 ENCOUNTER — Observation Stay (HOSPITAL_COMMUNITY)
Admission: EM | Admit: 2023-07-17 | Discharge: 2023-07-19 | Disposition: A | Discharge status: Home / Self Care | Payer: Medicare HMO | Attending: Internal Medicine | Admitting: Internal Medicine

## 2023-07-17 ENCOUNTER — Encounter (HOSPITAL_COMMUNITY): Payer: Self-pay

## 2023-07-17 DIAGNOSIS — I5020 Unspecified systolic (congestive) heart failure: Secondary | ICD-10-CM | POA: Clinically undetermined

## 2023-07-17 DIAGNOSIS — R2681 Unsteadiness on feet: Secondary | ICD-10-CM | POA: Insufficient documentation

## 2023-07-17 DIAGNOSIS — Z7901 Long term (current) use of anticoagulants: Secondary | ICD-10-CM | POA: Diagnosis not present

## 2023-07-17 DIAGNOSIS — R220 Localized swelling, mass and lump, head: Secondary | ICD-10-CM | POA: Diagnosis not present

## 2023-07-17 DIAGNOSIS — N179 Acute kidney failure, unspecified: Secondary | ICD-10-CM | POA: Diagnosis not present

## 2023-07-17 DIAGNOSIS — N184 Chronic kidney disease, stage 4 (severe): Secondary | ICD-10-CM | POA: Diagnosis not present

## 2023-07-17 DIAGNOSIS — Z950 Presence of cardiac pacemaker: Secondary | ICD-10-CM | POA: Diagnosis not present

## 2023-07-17 DIAGNOSIS — Z79899 Other long term (current) drug therapy: Secondary | ICD-10-CM | POA: Insufficient documentation

## 2023-07-17 DIAGNOSIS — E7800 Pure hypercholesterolemia, unspecified: Secondary | ICD-10-CM

## 2023-07-17 DIAGNOSIS — Z1152 Encounter for screening for COVID-19: Secondary | ICD-10-CM | POA: Diagnosis not present

## 2023-07-17 DIAGNOSIS — I10 Essential (primary) hypertension: Secondary | ICD-10-CM

## 2023-07-17 DIAGNOSIS — S0003XA Contusion of scalp, initial encounter: Secondary | ICD-10-CM | POA: Diagnosis not present

## 2023-07-17 DIAGNOSIS — Z8616 Personal history of COVID-19: Secondary | ICD-10-CM | POA: Insufficient documentation

## 2023-07-17 DIAGNOSIS — K219 Gastro-esophageal reflux disease without esophagitis: Secondary | ICD-10-CM

## 2023-07-17 DIAGNOSIS — R059 Cough, unspecified: Secondary | ICD-10-CM | POA: Insufficient documentation

## 2023-07-17 DIAGNOSIS — I2510 Atherosclerotic heart disease of native coronary artery without angina pectoris: Secondary | ICD-10-CM

## 2023-07-17 DIAGNOSIS — I5022 Chronic systolic (congestive) heart failure: Secondary | ICD-10-CM | POA: Diagnosis not present

## 2023-07-17 DIAGNOSIS — J22 Unspecified acute lower respiratory infection: Secondary | ICD-10-CM | POA: Diagnosis not present

## 2023-07-17 DIAGNOSIS — J069 Acute upper respiratory infection, unspecified: Secondary | ICD-10-CM | POA: Diagnosis not present

## 2023-07-17 DIAGNOSIS — I213 ST elevation (STEMI) myocardial infarction of unspecified site: Secondary | ICD-10-CM | POA: Diagnosis not present

## 2023-07-17 DIAGNOSIS — I130 Hypertensive heart and chronic kidney disease with heart failure and stage 1 through stage 4 chronic kidney disease, or unspecified chronic kidney disease: Secondary | ICD-10-CM | POA: Diagnosis not present

## 2023-07-17 DIAGNOSIS — S0990XA Unspecified injury of head, initial encounter: Secondary | ICD-10-CM | POA: Diagnosis not present

## 2023-07-17 DIAGNOSIS — S199XXA Unspecified injury of neck, initial encounter: Secondary | ICD-10-CM | POA: Diagnosis not present

## 2023-07-17 DIAGNOSIS — R918 Other nonspecific abnormal finding of lung field: Secondary | ICD-10-CM | POA: Diagnosis not present

## 2023-07-17 DIAGNOSIS — Z955 Presence of coronary angioplasty implant and graft: Secondary | ICD-10-CM | POA: Diagnosis not present

## 2023-07-17 DIAGNOSIS — N189 Chronic kidney disease, unspecified: Secondary | ICD-10-CM

## 2023-07-17 DIAGNOSIS — R55 Syncope and collapse: Principal | ICD-10-CM

## 2023-07-17 DIAGNOSIS — J90 Pleural effusion, not elsewhere classified: Secondary | ICD-10-CM | POA: Insufficient documentation

## 2023-07-17 DIAGNOSIS — D631 Anemia in chronic kidney disease: Secondary | ICD-10-CM

## 2023-07-17 LAB — RESPIRATORY PANEL BY PCR

## 2023-07-17 LAB — CBC WITH DIFFERENTIAL/PLATELET
Abs Immature Granulocytes: 0.05 10*3/uL (ref 0.00–0.07)
Basophils Absolute: 0 10*3/uL (ref 0.0–0.1)
Basophils Relative: 1 %
Eosinophils Absolute: 0.1 10*3/uL (ref 0.0–0.5)
Eosinophils Relative: 2 %
HCT: 28.4 % — ABNORMAL LOW (ref 39.0–52.0)
Hemoglobin: 9 g/dL — ABNORMAL LOW (ref 13.0–17.0)
Immature Granulocytes: 1 %
Lymphocytes Relative: 21 %
Lymphs Abs: 1.2 10*3/uL (ref 0.7–4.0)
MCH: 30 pg (ref 26.0–34.0)
MCHC: 31.7 g/dL (ref 30.0–36.0)
MCV: 94.7 fL (ref 80.0–100.0)
Monocytes Absolute: 0.5 10*3/uL (ref 0.1–1.0)
Monocytes Relative: 9 %
Neutro Abs: 3.8 10*3/uL (ref 1.7–7.7)
Neutrophils Relative %: 66 %
Platelets: 155 10*3/uL (ref 150–400)
RBC: 3 MIL/uL — ABNORMAL LOW (ref 4.22–5.81)
RDW: 15 % (ref 11.5–15.5)
WBC: 5.8 10*3/uL (ref 4.0–10.5)
nRBC: 0 % (ref 0.0–0.2)

## 2023-07-17 LAB — SARS CORONAVIRUS 2 BY RT PCR: SARS Coronavirus 2 by RT PCR: NEGATIVE

## 2023-07-17 LAB — URINALYSIS, ROUTINE W REFLEX MICROSCOPIC
Bilirubin Urine: NEGATIVE
Glucose, UA: NEGATIVE mg/dL
Hgb urine dipstick: NEGATIVE
Ketones, ur: NEGATIVE mg/dL
Leukocytes,Ua: NEGATIVE
Nitrite: NEGATIVE
Protein, ur: NEGATIVE mg/dL
Specific Gravity, Urine: 1.006 (ref 1.005–1.030)
pH: 5 (ref 5.0–8.0)

## 2023-07-17 LAB — COMPREHENSIVE METABOLIC PANEL
ALT: 14 U/L (ref 0–44)
AST: 19 U/L (ref 15–41)
Albumin: 3.2 g/dL — ABNORMAL LOW (ref 3.5–5.0)
Alkaline Phosphatase: 67 U/L (ref 38–126)
Anion gap: 13 (ref 5–15)
BUN: 69 mg/dL — ABNORMAL HIGH (ref 8–23)
CO2: 20 mmol/L — ABNORMAL LOW (ref 22–32)
Calcium: 8.6 mg/dL — ABNORMAL LOW (ref 8.9–10.3)
Chloride: 111 mmol/L (ref 98–111)
Creatinine, Ser: 5.55 mg/dL — ABNORMAL HIGH (ref 0.61–1.24)
GFR, Estimated: 9 mL/min — ABNORMAL LOW (ref >60)
Glucose, Bld: 114 mg/dL — ABNORMAL HIGH (ref 70–99)
Potassium: 3.9 mmol/L (ref 3.5–5.1)
Sodium: 144 mmol/L (ref 135–145)
Total Bilirubin: 0.5 mg/dL (ref 0.3–1.2)
Total Protein: 6.4 g/dL — ABNORMAL LOW (ref 6.5–8.1)

## 2023-07-17 LAB — TYPE AND SCREEN
ABO/RH(D): B POS
Antibody Screen: NEGATIVE

## 2023-07-17 LAB — BRAIN NATRIURETIC PEPTIDE: B Natriuretic Peptide: 1683.8 pg/mL — ABNORMAL HIGH (ref 0.0–100.0)

## 2023-07-17 LAB — SODIUM, URINE, RANDOM: Sodium, Ur: 93 mmol/L

## 2023-07-17 LAB — PROCALCITONIN: Procalcitonin: <0.1 ng/mL

## 2023-07-17 LAB — CBG MONITORING, ED: Glucose-Capillary: 93 mg/dL (ref 70–99)

## 2023-07-17 LAB — CREATININE, URINE, RANDOM: Creatinine, Urine: 27 mg/dL

## 2023-07-17 MED ORDER — ENOXAPARIN SODIUM 30 MG/0.3ML IJ SOSY: 30.0000 mg | PREFILLED_SYRINGE | INTRAMUSCULAR | Status: DC

## 2023-07-17 MED ORDER — FERROUS SULFATE 325 (65 FE) MG PO TABS
325.0000 mg | ORAL_TABLET | ORAL | Status: DC
  Administered 2023-07-17 – 2023-07-19 (×2): 325 mg via ORAL
  Filled 2023-07-17 (×4): qty 1

## 2023-07-17 MED ORDER — GUAIFENESIN ER 600 MG PO TB12
600.0000 mg | ORAL_TABLET | Freq: Two times a day (BID) | ORAL | Status: DC
  Administered 2023-07-17: 600 mg via ORAL
  Filled 2023-07-17: qty 1

## 2023-07-17 MED ORDER — ACETAMINOPHEN 325 MG PO TABS: 650.0000 mg | ORAL_TABLET | Freq: Four times a day (QID) | ORAL | Status: DC | PRN

## 2023-07-17 MED ORDER — BENZONATATE 100 MG PO CAPS
100.0000 mg | ORAL_CAPSULE | Freq: Three times a day (TID) | ORAL | Status: DC | PRN
  Administered 2023-07-18 – 2023-07-19 (×2): 100 mg via ORAL
  Filled 2023-07-17 (×2): qty 1

## 2023-07-17 MED ORDER — PNEUMOCOCCAL 20-VAL CONJ VACC 0.5 ML IM SUSY
0.5000 mL | PREFILLED_SYRINGE | INTRAMUSCULAR | Status: DC
  Filled 2023-07-17: qty 0.5

## 2023-07-17 MED ORDER — SODIUM CHLORIDE 0.9 % IV BOLUS: 1000.0000 mL | Freq: Once | INTRAVENOUS | Status: DC

## 2023-07-17 MED ORDER — ATORVASTATIN CALCIUM 10 MG PO TABS
20.0000 mg | ORAL_TABLET | Freq: Every day | ORAL | Status: DC
  Administered 2023-07-18 – 2023-07-19 (×2): 20 mg via ORAL
  Filled 2023-07-17 (×2): qty 2

## 2023-07-17 MED ORDER — AMLODIPINE BESYLATE 10 MG PO TABS
10.0000 mg | ORAL_TABLET | Freq: Every day | ORAL | Status: DC
  Administered 2023-07-18 – 2023-07-19 (×2): 10 mg via ORAL
  Filled 2023-07-17 (×2): qty 1

## 2023-07-17 MED ORDER — CARVEDILOL 12.5 MG PO TABS
12.5000 mg | ORAL_TABLET | Freq: Two times a day (BID) | ORAL | Status: DC
  Administered 2023-07-17 – 2023-07-19 (×4): 12.5 mg via ORAL
  Filled 2023-07-17 (×4): qty 1

## 2023-07-17 MED ORDER — SODIUM CHLORIDE 0.9 % IV BOLUS
500.0000 mL | Freq: Once | INTRAVENOUS | Status: AC
  Administered 2023-07-17: 500 mL via INTRAVENOUS

## 2023-07-17 MED ORDER — ACETAMINOPHEN 650 MG RE SUPP: 650.0000 mg | Freq: Four times a day (QID) | RECTAL | Status: DC | PRN

## 2023-07-17 MED ORDER — ALBUTEROL SULFATE (2.5 MG/3ML) 0.083% IN NEBU: 2.5000 mg | INHALATION_SOLUTION | Freq: Four times a day (QID) | RESPIRATORY_TRACT | Status: DC | PRN

## 2023-07-17 MED ORDER — ISOSORBIDE DINITRATE 20 MG PO TABS
20.0000 mg | ORAL_TABLET | Freq: Three times a day (TID) | ORAL | Status: DC
  Administered 2023-07-17 – 2023-07-19 (×5): 20 mg via ORAL
  Filled 2023-07-17: qty 1
  Filled 2023-07-17: qty 2
  Filled 2023-07-17 (×5): qty 1

## 2023-07-17 MED ORDER — APIXABAN 2.5 MG PO TABS
2.5000 mg | ORAL_TABLET | Freq: Two times a day (BID) | ORAL | Status: DC
  Administered 2023-07-17 – 2023-07-19 (×4): 2.5 mg via ORAL
  Filled 2023-07-17 (×4): qty 1

## 2023-07-17 MED ORDER — HYDRALAZINE HCL 25 MG PO TABS
37.5000 mg | ORAL_TABLET | Freq: Three times a day (TID) | ORAL | Status: DC
  Administered 2023-07-17 – 2023-07-19 (×5): 37.5 mg via ORAL
  Filled 2023-07-17 (×5): qty 2

## 2023-07-17 MED ORDER — INFLUENZA VAC A&B SURF ANT ADJ 0.5 ML IM SUSY
0.5000 mL | PREFILLED_SYRINGE | INTRAMUSCULAR | Status: DC
  Filled 2023-07-17: qty 0.5

## 2023-07-17 NOTE — ED Notes (Signed)
Patient placed on hospital bed for comfort.  Repositioned and warm blankets provided
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
## 2023-07-17 NOTE — ED Provider Notes (Signed)
Received patient in turnover from Dr. Theresia Lo.  Please see their note for further details of Hx, PE.  Briefly patient is a 89 y.o. male with a Loss of Consciousness .  Patient found to be dehydrated by blood work.  Patient's pacemaker report without significant concerning finding.  Plan for admission post CT.  CT scan is without intracranial or intraspinal acute pathology.  Plain film of the chest read is possible pneumonitis versus atelectasis.  I obtained further history from the patient.  He tells me that he had been diagnosed with pneumonia about a week ago cough and congestion and fever.  Feels like it is quite a bit better but yesterday he was very tired and lost consciousness about 5 or 6 times.  He does have worsening of his renal function here.  Will discuss with medicine.    Melene Plan, DO 07/17/23 1744

## 2023-07-18 DIAGNOSIS — I5020 Unspecified systolic (congestive) heart failure: Secondary | ICD-10-CM | POA: Diagnosis not present

## 2023-07-18 DIAGNOSIS — I132 Hypertensive heart and chronic kidney disease with heart failure and with stage 5 chronic kidney disease, or end stage renal disease: Secondary | ICD-10-CM | POA: Diagnosis not present

## 2023-07-18 DIAGNOSIS — D631 Anemia in chronic kidney disease: Secondary | ICD-10-CM | POA: Diagnosis not present

## 2023-07-18 DIAGNOSIS — J069 Acute upper respiratory infection, unspecified: Secondary | ICD-10-CM | POA: Diagnosis not present

## 2023-07-18 DIAGNOSIS — N185 Chronic kidney disease, stage 5: Secondary | ICD-10-CM | POA: Diagnosis not present

## 2023-07-18 DIAGNOSIS — R55 Syncope and collapse: Secondary | ICD-10-CM | POA: Diagnosis not present

## 2023-07-18 LAB — CBC
HCT: 26.8 % — ABNORMAL LOW (ref 39.0–52.0)
Hemoglobin: 8.7 g/dL — ABNORMAL LOW (ref 13.0–17.0)
MCH: 30.3 pg (ref 26.0–34.0)
MCHC: 32.5 g/dL (ref 30.0–36.0)
MCV: 93.4 fL (ref 80.0–100.0)
Platelets: 141 10*3/uL — ABNORMAL LOW (ref 150–400)
RBC: 2.87 MIL/uL — ABNORMAL LOW (ref 4.22–5.81)
RDW: 14.8 % (ref 11.5–15.5)
WBC: 5.8 10*3/uL (ref 4.0–10.5)
nRBC: 0 % (ref 0.0–0.2)

## 2023-07-18 LAB — BASIC METABOLIC PANEL
Anion gap: 12 (ref 5–15)
BUN: 67 mg/dL — ABNORMAL HIGH (ref 8–23)
CO2: 19 mmol/L — ABNORMAL LOW (ref 22–32)
Calcium: 8.1 mg/dL — ABNORMAL LOW (ref 8.9–10.3)
Chloride: 110 mmol/L (ref 98–111)
Creatinine, Ser: 5.74 mg/dL — ABNORMAL HIGH (ref 0.61–1.24)
GFR, Estimated: 9 mL/min — ABNORMAL LOW (ref >60)
Glucose, Bld: 94 mg/dL (ref 70–99)
Potassium: 3.5 mmol/L (ref 3.5–5.1)
Sodium: 141 mmol/L (ref 135–145)

## 2023-07-18 MED ORDER — GUAIFENESIN ER 600 MG PO TB12
1200.0000 mg | ORAL_TABLET | Freq: Two times a day (BID) | ORAL | Status: DC
  Administered 2023-07-18 – 2023-07-19 (×3): 1200 mg via ORAL
  Filled 2023-07-18 (×3): qty 2

## 2023-07-18 MED ORDER — SODIUM CHLORIDE 0.9 % IV BOLUS
500.0000 mL | Freq: Once | INTRAVENOUS | Status: AC
  Administered 2023-07-18: 500 mL via INTRAVENOUS

## 2023-07-18 MED ORDER — IPRATROPIUM-ALBUTEROL 0.5-2.5 (3) MG/3ML IN SOLN
3.0000 mL | Freq: Four times a day (QID) | RESPIRATORY_TRACT | Status: DC
  Filled 2023-07-18: qty 3

## 2023-07-18 MED ORDER — IPRATROPIUM-ALBUTEROL 0.5-2.5 (3) MG/3ML IN SOLN: 3.0000 mL | Freq: Four times a day (QID) | RESPIRATORY_TRACT | Status: DC | PRN
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
## 2023-07-18 NOTE — Progress Notes (Signed)
I have called to check on Echo status---they are aware of order, but short staffed
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
## 2023-07-18 NOTE — Progress Notes (Signed)
Mobility Specialist Progress Note:   07/18/23 1200  Orthostatic Lying   BP- Lying 128/66  Pulse- Lying 78  Orthostatic Sitting  BP- Sitting 137/60  Pulse- Sitting 87  Orthostatic Standing at 0 minutes  BP- Standing at 0 minutes 112/50  Pulse- Standing at 0 minutes 72  Mobility  Activity Ambulated with assistance in hallway;Stood at bedside  Level of Assistance Contact guard assist, steadying assist  Assistive Device Front wheel walker  Distance Ambulated (ft) 100 ft  Activity Response Tolerated well  Mobility Referral Yes  $Mobility charge 1 Mobility  Mobility Specialist Start Time (ACUTE ONLY) 1207  Mobility Specialist Stop Time (ACUTE ONLY) 1230  Mobility Specialist Time Calculation (min) (ACUTE ONLY) 23 min    Pre Mobility: 78 HR,  128/66 (85) BP,  95% SpO2 During Mobility: 80 HR,  98% SpO2 Post Mobility:  65 HR,  100% SpO2  Pt received in bed, agreeable to mobility. Negative for orthostatics as well as asymptomatic. No complaints throughout. Pt left in bed with call bell and all needs met.  Steven Rosario Mobility Specialist Please contact via Special educational needs teacher or Rehab office at [phone]
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
## 2023-07-18 NOTE — Care Management Obs Status (Signed)
MEDICARE OBSERVATION STATUS NOTIFICATION   Patient Details  Name: Steven Rosario MRN: [id number] Date of Birth: [birth year]   Medicare Observation Status Notification Given:  Yes    Kingsley Plan, RN 07/18/2023, 1:46 PM

## 2023-07-18 NOTE — TOC Initial Note (Signed)
Transition of Care (TOC) - Initial/Assessment Note   Spoke to patient at bedside. Patient from home alone. Has friends close by if needs assistance. Patient's son had back surgery yesterday and is currently in hospital.   Awaiting PT note. Patient has had [facility] in the past and if PT recommends home health he would like [facility] again.   Patient has three walkers at home, grab bars and handicap bathroom  Patient Details  Name: Steven Rosario MRN: [id number] Date of Birth: May 05, 1929  Transition of Care [facility]) CM/SW Contact:    Kingsley Plan, RN Phone Number: 07/18/2023, 1:49 PM  Clinical Narrative:                   Expected Discharge Plan:  (await PT note) Barriers to Discharge: Continued Medical Work up   Patient Goals and CMS Choice Patient states their goals for this hospitalization and ongoing recovery are:: to return to home CMS Medicare.gov Compare Post Acute Care list provided to:: Patient Choice offered to / list presented to : Patient      Expected Discharge Plan and Services   Discharge Planning Services: CM Consult   Living arrangements for the past 2 months: Single Family Home                 DME Arranged:  (await PT note)         HH Arranged:  (await PT note)          Prior Living Arrangements/Services Living arrangements for the past 2 months: Single Family Home Lives with:: Self Patient language and need for interpreter reviewed:: Yes Do you feel safe going back to the place where you live?: Yes      Need for Family Participation in Patient Care: Yes (Comment) Care giver support system in place?: Yes (comment) Current home services: DME Criminal Activity/Legal Involvement Pertinent to Current Situation/Hospitalization: No - Comment as needed  Activities of Daily Living   ADL Screening (condition at time of admission) Independently performs ADLs?: Yes (appropriate for developmental age) Is the patient deaf or have difficulty hearing?:  No Does the patient have difficulty seeing, even when wearing glasses/contacts?: No Does the patient have difficulty concentrating, remembering, or making decisions?: No  Permission Sought/Granted   Permission granted to share information with : No              Emotional Assessment Appearance:: Appears stated age Attitude/Demeanor/Rapport: Engaged Affect (typically observed): Accepting Orientation: : Oriented to Self, Oriented to Place, Oriented to  Time, Oriented to Situation Alcohol / Substance Use: Not Applicable Psych Involvement: No (comment)  Admission diagnosis:  AKI (acute kidney injury) (HCC) [N17.9] Syncope, unspecified syncope type [R55] Recurrent syncope [R55] Patient Active Problem List   Diagnosis Date Noted   Syncope, vasovagal 07/17/2023   Upper respiratory infection 07/17/2023   Heart failure with reduced ejection fraction (HCC) 07/17/2023   Anemia of chronic kidney failure 07/17/2023   Chronic kidney disease (CKD), stage IV (severe) (HCC) 12/03/2022   Occult GI bleeding 11/29/2022   Renal dysfunction 11/29/2022   Acute combined systolic and diastolic heart failure (HCC) 11/28/2022   Arrhythmia 11/02/2022   Acute kidney injury superimposed on chronic kidney disease (HCC)    Coronary artery disease    Essential hypertension, benign 11/17/2019   Hypercholesteremia 11/17/2019   Pacemaker [employer] device implant date 08/23/2017 11/17/2019   Senile arthritis 11/17/2019   Esophageal reflux 11/17/2019   Pernicious anemia 11/17/2019   Gout, unspecified 11/17/2019   PCP:  Noni Saupe, MD Pharmacy:   [facility] - [place], [place] - [address] [place] [address] Phone: [phone] Fax: [phone]  [facility] [address] [place] [address] Phone: [phone] Fax: [phone]     Social Determinants of Health (SDOH) Social History: SDOH Screenings    Food Insecurity: No Food Insecurity (07/17/2023)  Housing: Low Risk  (07/17/2023)  Transportation Needs: No Transportation Needs (07/17/2023)  Utilities: Not At Risk (07/17/2023)  Alcohol Screen: Low Risk  (11/02/2022)  Financial Resource Strain: Low Risk  (11/02/2022)  Tobacco Use: Low Risk  (07/17/2023)   SDOH Interventions:     Readmission Risk Interventions    12/04/2022   11:28 AM 11/30/2022    4:26 PM  Readmission Risk Prevention Plan  Transportation Screening Complete Complete  PCP or Specialist Appt within 3-5 Days Complete   HRI or Home Care Consult Complete Complete  Palliative Care Screening Not Applicable Not Applicable  Medication Review (RN Care Manager) Complete Complete

## 2023-07-19 ENCOUNTER — Observation Stay (HOSPITAL_COMMUNITY): Payer: Medicare HMO

## 2023-07-19 DIAGNOSIS — R55 Syncope and collapse: Secondary | ICD-10-CM | POA: Diagnosis not present

## 2023-07-19 LAB — BASIC METABOLIC PANEL
Anion gap: 13 (ref 5–15)
BUN: 68 mg/dL — ABNORMAL HIGH (ref 8–23)
CO2: 19 mmol/L — ABNORMAL LOW (ref 22–32)
Calcium: 8.1 mg/dL — ABNORMAL LOW (ref 8.9–10.3)
Chloride: 108 mmol/L (ref 98–111)
Creatinine, Ser: 5.68 mg/dL — ABNORMAL HIGH (ref 0.61–1.24)
GFR, Estimated: 9 mL/min — ABNORMAL LOW (ref >60)
Glucose, Bld: 131 mg/dL — ABNORMAL HIGH (ref 70–99)
Potassium: 3.7 mmol/L (ref 3.5–5.1)
Sodium: 140 mmol/L (ref 135–145)

## 2023-07-19 LAB — ECHOCARDIOGRAM COMPLETE
Area-P 1/2: 2.91 cm2
Height: 69 in
Single Plane A4C EF: 45 %
Weight: 2720 [oz_av]

## 2023-07-19 LAB — UREA NITROGEN, URINE: Urea Nitrogen, Ur: 167 mg/dL

## 2023-07-19 MED ORDER — GUAIFENESIN ER 600 MG PO TB12: 1200.0000 mg | ORAL_TABLET | Freq: Two times a day (BID) | ORAL | 0 refills | Status: AC

## 2023-07-19 MED ORDER — SODIUM BICARBONATE 650 MG PO TABS: 650.0000 mg | ORAL_TABLET | Freq: Two times a day (BID) | ORAL | 0 refills | Status: DC

## 2023-07-19 NOTE — Progress Notes (Signed)
Mobility Specialist Progress Note:   07/19/23 0800  Orthostatic Lying   BP- Lying 131/59  Pulse- Lying 60  Orthostatic Sitting  BP- Sitting 118/59  Pulse- Sitting 76  Orthostatic Standing at 0 minutes  BP- Standing at 0 minutes 127/53  Pulse- Standing at 0 minutes 82  Mobility  Activity Stood at bedside (Simultaneous filing. User may not have seen previous data.)  Level of Assistance Contact guard assist, steadying assist  Assistive Device Front wheel walker  Distance Ambulated (ft) 5 ft  Activity Response Tolerated well (Simultaneous filing. User may not have seen previous data.)  Mobility Referral Yes  $Mobility charge 1 Mobility  Mobility Specialist Start Time (ACUTE ONLY) 0831  Mobility Specialist Stop Time (ACUTE ONLY) [id number]  Mobility Specialist Time Calculation (min) (ACUTE ONLY) 11 min    Pre Mobility: 60 HR During Mobility: 76 HR  Pt received in bed, agreeable to mobility. Performed supine, seated, and standing orthostatics. Negative and asymptomatic throughout. Pt left in bed with call bell and all needs met.   [facility] Earlene Plater Mobility Specialist Please contact via Special educational needs teacher or Rehab office at [phone]
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
## 2023-07-19 NOTE — Progress Notes (Signed)
  Echocardiogram 2D Echocardiogram has been performed.  Delcie Roch 07/19/2023, 2:13 PM

## 2023-07-19 NOTE — TOC Transition Note (Signed)
Transition of Care [facility]) - CM/SW Discharge Note   Patient Details  Name: Samul Kreyling MRN: [id number] Date of Birth: [birth year]  [facility] CM/SW Contact:  Leone Haven, RN Phone Number: 07/19/2023, 12:02 PM   Clinical Narrative:    Per handoff from previous NCM patient wants [facility] for HHPT.  NCM made referral to [facility] with [facility] for HHPT.  Soc will begin 24 to 48 hrs post dc.       Barriers to Discharge: Continued Medical Work up   Patient Goals and CMS Choice CMS Medicare.gov Compare Post Acute Care list provided to:: Patient Choice offered to / list presented to : Patient  Discharge Placement                         Discharge Plan and Services Additional resources added to the After Visit Summary for     Discharge Planning Services: CM Consult            DME Arranged:  (await PT note)         HH Arranged:  (await PT note)          Social Determinants of Health (SDOH) Interventions SDOH Screenings   Food Insecurity: No Food Insecurity (07/17/2023)  Housing: Low Risk  (07/17/2023)  Transportation Needs: No Transportation Needs (07/17/2023)  Utilities: Not At Risk (07/17/2023)  Alcohol Screen: Low Risk  (11/02/2022)  Financial Resource Strain: Low Risk  (11/02/2022)  Tobacco Use: Low Risk  (07/17/2023)     Readmission Risk Interventions    12/04/2022   11:28 AM 11/30/2022    4:26 PM  Readmission Risk Prevention Plan  Transportation Screening Complete Complete  PCP or Specialist Appt within 3-5 Days Complete   HRI or Home Care Consult Complete Complete  Palliative Care Screening Not Applicable Not Applicable  Medication Review (RN Care Manager) Complete Complete

## 2023-07-19 NOTE — Plan of Care (Signed)

## 2023-07-19 NOTE — Progress Notes (Addendum)
Patient is upset that Dr Renford Dills has contacted his son with updates, he and his son do not get along, he will not be calling his cousin for a ride home, he has a friend from the church to take him at discharge---I did verify that ride home is capable of seeing after dark and that he could help him into his home---Dr Melony Overly patient's wallet with  DL, and all credit cards, [phone] cash accounted for by patient and security return document signed and on patient's chart
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
## 2023-07-22 ENCOUNTER — Ambulatory Visit (HOSPITAL_COMMUNITY)
Admission: RE | Admit: 2023-07-22 | Discharge: 2023-07-22 | Disposition: A | Discharge status: Home / Self Care | Payer: Medicare HMO | Source: Ambulatory Visit | Attending: Nephrology | Admitting: Nephrology

## 2023-07-22 VITALS — BP 134/74 | HR 66 | Temp 97.4°F | Resp 18

## 2023-07-22 DIAGNOSIS — D631 Anemia in chronic kidney disease: Secondary | ICD-10-CM | POA: Insufficient documentation

## 2023-07-22 DIAGNOSIS — N185 Chronic kidney disease, stage 5: Secondary | ICD-10-CM | POA: Insufficient documentation

## 2023-07-22 LAB — POCT HEMOGLOBIN-HEMACUE: Hemoglobin: 9.4 g/dL — ABNORMAL LOW (ref 13.0–17.0)

## 2023-07-22 MED ORDER — EPOETIN ALFA-EPBX 10000 UNIT/ML IJ SOLN
INTRAMUSCULAR | Status: AC
  Administered 2023-07-22: 8000 [IU] via SUBCUTANEOUS
  Filled 2023-07-22: qty 1

## 2023-07-22 MED ORDER — EPOETIN ALFA-EPBX 10000 UNIT/ML IJ SOLN: 8000.0000 [IU] | INTRAMUSCULAR | Status: DC

## 2023-07-28 DIAGNOSIS — K219 Gastro-esophageal reflux disease without esophagitis: Secondary | ICD-10-CM | POA: Diagnosis not present

## 2023-07-28 DIAGNOSIS — N184 Chronic kidney disease, stage 4 (severe): Secondary | ICD-10-CM | POA: Diagnosis not present

## 2023-07-28 DIAGNOSIS — E7800 Pure hypercholesterolemia, unspecified: Secondary | ICD-10-CM | POA: Diagnosis not present

## 2023-07-28 DIAGNOSIS — I130 Hypertensive heart and chronic kidney disease with heart failure and stage 1 through stage 4 chronic kidney disease, or unspecified chronic kidney disease: Secondary | ICD-10-CM | POA: Diagnosis not present

## 2023-07-28 DIAGNOSIS — D631 Anemia in chronic kidney disease: Secondary | ICD-10-CM | POA: Diagnosis not present

## 2023-07-28 DIAGNOSIS — I2510 Atherosclerotic heart disease of native coronary artery without angina pectoris: Secondary | ICD-10-CM | POA: Diagnosis not present

## 2023-07-28 DIAGNOSIS — N179 Acute kidney failure, unspecified: Secondary | ICD-10-CM | POA: Diagnosis not present

## 2023-07-28 DIAGNOSIS — I088 Other rheumatic multiple valve diseases: Secondary | ICD-10-CM | POA: Diagnosis not present

## 2023-07-28 DIAGNOSIS — I5022 Chronic systolic (congestive) heart failure: Secondary | ICD-10-CM | POA: Diagnosis not present

## 2023-07-30 DIAGNOSIS — I130 Hypertensive heart and chronic kidney disease with heart failure and stage 1 through stage 4 chronic kidney disease, or unspecified chronic kidney disease: Secondary | ICD-10-CM | POA: Diagnosis not present

## 2023-07-30 DIAGNOSIS — N184 Chronic kidney disease, stage 4 (severe): Secondary | ICD-10-CM | POA: Diagnosis not present

## 2023-07-30 DIAGNOSIS — I088 Other rheumatic multiple valve diseases: Secondary | ICD-10-CM | POA: Diagnosis not present

## 2023-07-30 DIAGNOSIS — I5022 Chronic systolic (congestive) heart failure: Secondary | ICD-10-CM | POA: Diagnosis not present

## 2023-07-30 DIAGNOSIS — I2510 Atherosclerotic heart disease of native coronary artery without angina pectoris: Secondary | ICD-10-CM | POA: Diagnosis not present

## 2023-07-30 DIAGNOSIS — N179 Acute kidney failure, unspecified: Secondary | ICD-10-CM | POA: Diagnosis not present

## 2023-07-30 DIAGNOSIS — E7800 Pure hypercholesterolemia, unspecified: Secondary | ICD-10-CM | POA: Diagnosis not present

## 2023-07-30 DIAGNOSIS — D631 Anemia in chronic kidney disease: Secondary | ICD-10-CM | POA: Diagnosis not present

## 2023-07-30 DIAGNOSIS — K219 Gastro-esophageal reflux disease without esophagitis: Secondary | ICD-10-CM | POA: Diagnosis not present

## 2023-08-05 ENCOUNTER — Ambulatory Visit (HOSPITAL_COMMUNITY)
Admission: RE | Admit: 2023-08-05 | Discharge: 2023-08-05 | Disposition: A | Discharge status: Home / Self Care | Payer: Medicare HMO | Source: Ambulatory Visit | Attending: Nephrology | Admitting: Nephrology

## 2023-08-05 VITALS — BP 135/65 | HR 64 | Temp 97.0°F | Resp 17

## 2023-08-05 DIAGNOSIS — N185 Chronic kidney disease, stage 5: Secondary | ICD-10-CM | POA: Diagnosis not present

## 2023-08-05 DIAGNOSIS — D631 Anemia in chronic kidney disease: Secondary | ICD-10-CM | POA: Diagnosis not present

## 2023-08-05 LAB — POCT HEMOGLOBIN-HEMACUE: Hemoglobin: 9.8 g/dL — ABNORMAL LOW (ref 13.0–17.0)

## 2023-08-05 MED ORDER — EPOETIN ALFA-EPBX 10000 UNIT/ML IJ SOLN
8000.0000 [IU] | INTRAMUSCULAR | Status: DC
Start: 2023-08-05 — End: 2024-08-17

## 2023-08-05 MED ORDER — EPOETIN ALFA-EPBX 10000 UNIT/ML IJ SOLN
INTRAMUSCULAR | Status: AC
  Administered 2023-08-05: 8000 [IU] via SUBCUTANEOUS
  Filled 2023-08-05: qty 1

## 2023-08-06 DIAGNOSIS — D631 Anemia in chronic kidney disease: Secondary | ICD-10-CM | POA: Diagnosis not present

## 2023-08-06 DIAGNOSIS — I088 Other rheumatic multiple valve diseases: Secondary | ICD-10-CM | POA: Diagnosis not present

## 2023-08-06 DIAGNOSIS — I130 Hypertensive heart and chronic kidney disease with heart failure and stage 1 through stage 4 chronic kidney disease, or unspecified chronic kidney disease: Secondary | ICD-10-CM | POA: Diagnosis not present

## 2023-08-06 DIAGNOSIS — N179 Acute kidney failure, unspecified: Secondary | ICD-10-CM | POA: Diagnosis not present

## 2023-08-06 DIAGNOSIS — I2510 Atherosclerotic heart disease of native coronary artery without angina pectoris: Secondary | ICD-10-CM | POA: Diagnosis not present

## 2023-08-06 DIAGNOSIS — I5022 Chronic systolic (congestive) heart failure: Secondary | ICD-10-CM | POA: Diagnosis not present

## 2023-08-06 DIAGNOSIS — E7800 Pure hypercholesterolemia, unspecified: Secondary | ICD-10-CM | POA: Diagnosis not present

## 2023-08-06 DIAGNOSIS — K219 Gastro-esophageal reflux disease without esophagitis: Secondary | ICD-10-CM | POA: Diagnosis not present

## 2023-08-06 DIAGNOSIS — N184 Chronic kidney disease, stage 4 (severe): Secondary | ICD-10-CM | POA: Diagnosis not present

## 2023-08-12 ENCOUNTER — Ambulatory Visit (INDEPENDENT_AMBULATORY_CARE_PROVIDER_SITE_OTHER): Payer: Medicare HMO

## 2023-08-12 DIAGNOSIS — I442 Atrioventricular block, complete: Secondary | ICD-10-CM | POA: Diagnosis not present

## 2023-08-13 DIAGNOSIS — N179 Acute kidney failure, unspecified: Secondary | ICD-10-CM | POA: Diagnosis not present

## 2023-08-13 DIAGNOSIS — N184 Chronic kidney disease, stage 4 (severe): Secondary | ICD-10-CM | POA: Diagnosis not present

## 2023-08-13 DIAGNOSIS — I5022 Chronic systolic (congestive) heart failure: Secondary | ICD-10-CM | POA: Diagnosis not present

## 2023-08-13 DIAGNOSIS — K219 Gastro-esophageal reflux disease without esophagitis: Secondary | ICD-10-CM | POA: Diagnosis not present

## 2023-08-13 DIAGNOSIS — I2510 Atherosclerotic heart disease of native coronary artery without angina pectoris: Secondary | ICD-10-CM | POA: Diagnosis not present

## 2023-08-13 DIAGNOSIS — I088 Other rheumatic multiple valve diseases: Secondary | ICD-10-CM | POA: Diagnosis not present

## 2023-08-13 DIAGNOSIS — I130 Hypertensive heart and chronic kidney disease with heart failure and stage 1 through stage 4 chronic kidney disease, or unspecified chronic kidney disease: Secondary | ICD-10-CM | POA: Diagnosis not present

## 2023-08-13 DIAGNOSIS — E7800 Pure hypercholesterolemia, unspecified: Secondary | ICD-10-CM | POA: Diagnosis not present

## 2023-08-13 DIAGNOSIS — D631 Anemia in chronic kidney disease: Secondary | ICD-10-CM | POA: Diagnosis not present

## 2023-08-13 LAB — CUP PACEART REMOTE DEVICE CHECK
Battery Remaining Longevity: 38 mo
Battery Remaining Percentage: 35 %
Battery Voltage: 2.95 V
Brady Statistic AP VP Percent: 93 %
Brady Statistic AP VS Percent: 1.5 %
Brady Statistic AS VP Percent: 4.9 %
Brady Statistic AS VS Percent: 1 %
Brady Statistic RA Percent Paced: 94 %
Brady Statistic RV Percent Paced: 98 %
Date Time Interrogation Session: 20241111022422
Implantable Lead Connection Status: 753985
Implantable Lead Connection Status: 753985
Implantable Lead Implant Date: 20181123
Implantable Lead Implant Date: 20181123
Implantable Lead Location: 753859
Implantable Lead Location: 753860
Implantable Pulse Generator Implant Date: 20181123
Lead Channel Impedance Value: 390 Ohm
Lead Channel Impedance Value: 400 Ohm
Lead Channel Pacing Threshold Amplitude: 0.75 V
Lead Channel Pacing Threshold Amplitude: 0.875 V
Lead Channel Pacing Threshold Pulse Width: 0.5 ms
Lead Channel Pacing Threshold Pulse Width: 0.5 ms
Lead Channel Sensing Intrinsic Amplitude: 12 mV
Lead Channel Sensing Intrinsic Amplitude: 2 mV
Lead Channel Setting Pacing Amplitude: 1 V
Lead Channel Setting Pacing Amplitude: 1.875
Lead Channel Setting Pacing Pulse Width: 0.5 ms
Lead Channel Setting Sensing Sensitivity: 2 mV
Pulse Gen Model: 2272
Pulse Gen Serial Number: 8957038

## 2023-08-15 ENCOUNTER — Encounter: Payer: Self-pay | Admitting: Internal Medicine

## 2023-08-15 ENCOUNTER — Ambulatory Visit: Payer: Medicare HMO | Admitting: Internal Medicine

## 2023-08-15 VITALS — BP 134/70 | HR 68 | Ht 69.0 in | Wt 170.0 lb

## 2023-08-15 DIAGNOSIS — Z860100 Personal history of colon polyps, unspecified: Secondary | ICD-10-CM

## 2023-08-15 DIAGNOSIS — D649 Anemia, unspecified: Secondary | ICD-10-CM

## 2023-08-15 DIAGNOSIS — Z8719 Personal history of other diseases of the digestive system: Secondary | ICD-10-CM | POA: Diagnosis not present

## 2023-08-15 NOTE — Patient Instructions (Signed)
Please contact us if your red blood count decrease  Follow up as needed _______________________________________________________  If your blood pressure at your visit was 140/90 or greater, please contact your primary care physician to follow up on this.  _______________________________________________________  If you are age 87 or older, your body mass index should be between 23-30. Your Body mass index is 25.1 kg/m. If this is out of the aforementioned range listed, please consider follow up with your Primary Care Provider.  If you are age 43 or younger, your body mass index should be between 19-25. Your Body mass index is 25.1 kg/m. If this is out of the aformentioned range listed, please consider follow up with your Primary Care Provider.   ________________________________________________________  The [facility] providers would like to encourage you to use [facility] to communicate with providers for non-urgent requests or questions.  Due to long hold times on the telephone, sending your provider a message by [facility] may be a faster and more efficient way to get a response.  Please allow 48 business hours for a response.  Please remember that this is for non-urgent requests.  _______________________________________________________

## 2023-08-15 NOTE — Progress Notes (Signed)
Chief Complaint: Iron deficiency anemia  HPI : 89 year old male with history of CAD s/p PCI, HFrEF (EF 30-35%), complete heart block s/p PM, A-fib on Eliquis, CKD, prostate cancer s/p prostatectomy, prior SBO presents for follow-up of iron deficiency anemia.  Patient was admitted 2/28 to 12/04/2022 for shortness of breath, suspected to be due to a heart failure exacerbation as well as possible symptomatic anemia.  He underwent EGD and colonoscopy during that hospitalization that showed gastritis and removal of 19-20 colon polyps.  He was given IV iron during that hospitalization.  He has had several bowel obstructions in the past but to his knowledge he has never had a portion of his colon resected.  Interval History: Patient presents with his son to clinic today.  He is okay with doing IV iron infusions, which have been prescribed by his nephrologist. Denies blood in the stools. Endorses some SOB with exertion. Endorses fatigue. Denies lightheadedness. He has to get up several times in the night to use the bathroom, and his mind constantly runs with projects that he has ongoing as well.  He takes vitamin D supplements. He does get constipated and has one BM once every 3 days. He takes a fiber pill and occasional prune juice to help with constipation.  Patient lives by himself.  His wife passed away 6 years ago.  Now follows with Dr. Welton Flakes [facility] as his PCP because his prior PCP Dr. Jeanie Sewer switched practices.   Wt Readings from Last 3 Encounters:  08/15/23 170 lb (77.1 kg)  07/17/23 170 lb (77.1 kg)  06/25/23 170 lb 4.8 oz (77.2 kg)   Past Medical History:  Diagnosis Date   Acute kidney injury (AKI) with acute tubular necrosis (ATN) (HCC)    Cervical disc disease    Chronic kidney disease    Coronary artery disease    Gout    Hypertension    Nephrolithiasis    Pneumonia due to COVID-19 virus    Presence of stent in LAD coronary artery    Prostate cancer (HCC)    S/P placement  of cardiac pacemaker    Urinary incontinence      Past Surgical History:  Procedure Laterality Date   ANKLE FRACTURE SURGERY Right    cardiac stents x2     caudal epidural injection     CERVICAL DISCECTOMY     COLONOSCOPY     COLONOSCOPY WITH PROPOFOL N/A 12/01/2022   Procedure: COLONOSCOPY WITH PROPOFOL;  Surgeon: Jeani Hawking, MD;  Location: [facility] ENDOSCOPY;  Service: Gastroenterology;  Laterality: N/A;   ESOPHAGOGASTRODUODENOSCOPY (EGD) WITH PROPOFOL N/A 11/30/2022   Procedure: ESOPHAGOGASTRODUODENOSCOPY (EGD) WITH PROPOFOL;  Surgeon: Imogene Burn, MD;  Location: [facility] ENDOSCOPY;  Service: Gastroenterology;  Laterality: N/A;   EXPLORATORY LAPAROTOMY     HEMOSTASIS CLIP PLACEMENT  12/01/2022   Procedure: HEMOSTASIS CLIP PLACEMENT;  Surgeon: Jeani Hawking, MD;  Location: [facility] ENDOSCOPY;  Service: Gastroenterology;;   lumbar facet joint injection Bilateral    medical branch block Bilateral    POLYPECTOMY  12/01/2022   Procedure: POLYPECTOMY;  Surgeon: Jeani Hawking, MD;  Location: [facility] ENDOSCOPY;  Service: Gastroenterology;;   PROSTATECTOMY     RADIOFREQUENCY ABLATION Bilateral    ROTATOR CUFF REPAIR Right    SACROILIAC JOINT ARTHRODESIS Right    SACROILIAC JOINT ARTHRODESIS Bilateral    sacroilliac joint inj     SPIROMETRY     Family History  Problem Relation Age of Onset   Diabetes Mother  Obesity Mother    Heart failure Mother    CAD Father    Bone cancer Father    Heart disease Sister    Obesity Brother    Lung cancer Son    Social History   Tobacco Use   Smoking status: Never   Smokeless tobacco: Never  Vaping Use   Vaping status: Never Used  Substance Use Topics   Alcohol use: Not Currently    Comment: past   Drug use: Never   Current Outpatient Medications  Medication Sig Dispense Refill   amLODipine (NORVASC) 5 MG tablet Take 10 mg by mouth daily.     apixaban (ELIQUIS) 2.5 MG TABS tablet Take 2.5 mg by mouth 2 (two) times daily. Pt taking 1 time a day      atorvastatin (LIPITOR) 20 MG tablet Take 20 mg by mouth daily.     benzonatate (TESSALON) 100 MG capsule Take 100 mg by mouth 3 (three) times daily as needed.     carvedilol (COREG) 12.5 MG tablet Take 1 tablet (12.5 mg total) by mouth 2 (two) times daily. 60 tablet 11   cyanocobalamin (VITAMIN B12) 1000 MCG tablet Take 1,000 mcg by mouth daily.     ferrous sulfate 325 (65 FE) MG EC tablet Take 325 mg by mouth 3 (three) times a week.     FIBER PO Take 1 tablet by mouth daily.     furosemide (LASIX) 40 MG tablet Take 1 tablet (40 mg total) by mouth daily. Please dont take lasix if weight is less than 181 pounds. 90 tablet 3   hydrALAZINE (APRESOLINE) 25 MG tablet TAKE 1 AND 1/2 TABLETS THREE TIMES DAILY (Patient taking differently: Take 7.5 mg by mouth 3 (three) times daily.) 405 tablet 3   isosorbide dinitrate (ISORDIL) 20 MG tablet TAKE 1 TABLET THREE TIMES DAILY 270 tablet 3   Multiple Vitamins-Minerals (CENTRUM SILVER PO) Take by mouth.     Multiple Vitamins-Minerals (PRESERVISION AREDS 2) CAPS Take 1 capsule by mouth 3 (three) times daily.     potassium chloride (MICRO-K) 10 MEQ CR capsule Take 10 mEq by mouth daily.     sodium bicarbonate 650 MG tablet Take 1 tablet (650 mg total) by mouth 2 (two) times daily. 120 tablet 0   No current facility-administered medications for this visit.   No Known Allergies   Physical Exam: BP 134/70   Pulse 68   Ht 5\' 9"  (1.753 m)   Wt 170 lb (77.1 kg)   BMI 25.10 kg/m  Constitutional: Pleasant,well-developed, male in no acute distress. HEENT: Normocephalic and atraumatic. Conjunctivae are normal. No scleral icterus. Cardiovascular: Regular rate. Pulmonary/chest: Effort normal Abdominal: Soft, nondistended, nontender. Extremities: No edema Neurological: Alert and oriented to person place and time. Skin: Skin is warm and dry. No rashes noted. Psychiatric: Normal mood and affect. Behavior is normal.  Labs 11/2022: CBC with low Hb of 8.9  Labs  01/2023: Hb low at 11.8. TSH nml. LFTs nml. BMP with elevated Cr of 3.98 and GFR of 14. Iron studies normal. Vit D low  Labs 04/2023: CBC with low Hb 9.5  Labs 05/2023: CBC with low hemoglobin of 9.1.  Iron panel shows low iron sat of 19.8%. tTG IgA negative.  IgA mildly elevated at 464.  H. pylori IgG negative.  Labs 08/2023: CBC with hemoglobin of 9.8.    EGD 11/30/22: - Normal esophagus.  - A single gastric polyp.  - Gastritis.  - Normal examined duodenum.  - No specimens  collected.  Colonoscopy 12/01/22: - 19- 20 3 to 20 mm polyps in the sigmoid colon, in the descending colon, in the ascending colon and in the cecum, removed with a cold snare. Resected and retrieved. Clip manufacturer: [employer]. Clips ( MR safe) were placed. Path: A. COLON, DESCENDING, POLYPECTOMY:  - TUBULAR ADENOMA(S), WITH FOCAL HIGH-GRADE DYSPLASIA (MULTIPLE  FRAGMENTS)  - NEGATIVE FOR MALIGNANCY  B. COLON, ASCENDING AND CECAL, POLYPECTOMY:  - TUBULAR ADENOMA(S) (3 FRAGMENTS)  - NEGATIVE FOR HIGH-GRADE DYSPLASIA OR MALIGNANCY  C. COLON, SIGMOID, POLYPECTOMY:  - TUBULOVILLOUS ADENOMA (1 FRAGMENT)  - NEGATIVE FOR HIGH-GRADE DYSPLASIA OR MALIGNANCY   ASSESSMENT AND PLAN: Iron deficiency anemia History of gastritis Prior SBO History of colon polyps Patient's hemoglobin has been increasing more recently with the use of IV iron infusions that are being administered by his nephrologist.  His hemoglobin the last time I saw him in clinic was 9.1, and has recently increased to 9.8.  I had offered the patient a SBE in the hospital previously, but he declined at that time.  Since his hemoglobin is continuing to uptrend, I do think it is reasonable to continue IV iron infusions and keep an eye on his hemoglobin over time.  If his hemoglobin decreases again in the future, could readdress the idea of SBE.  Patient is not a candidate for VCE due to history of small bowel obstruction.  His prior EGD and colonoscopy did not  reveal an obvious source of blood loss with the exception of some gastric erosions. -Continue IV iron infusions - Would not be a good candidate for VCE due to his history of SBO - Could consider SBE in the hospital for further evaluation if hemoglobin starts to downtrend again in the future - Could consider restarting a PPI to see if this helps with his hemoglobin as well - RTC PRN.  Patient will contact me again if his hemoglobin starts to downtrend.  Eulah Pont, MD  I spent 40 minutes of time, including in depth chart review, independent review of results as outlined above, communicating results with the patient directly, face-to-face time with the patient, coordinating care, ordering studies and medications as appropriate, and documentation.

## 2023-08-19 ENCOUNTER — Encounter (HOSPITAL_COMMUNITY)
Admission: RE | Admit: 2023-08-19 | Discharge: 2023-08-19 | Disposition: A | Discharge status: Home / Self Care | Payer: Medicare HMO | Source: Ambulatory Visit | Attending: Nephrology | Admitting: Nephrology

## 2023-08-19 VITALS — BP 127/64 | HR 65 | Temp 97.1°F | Resp 16

## 2023-08-19 DIAGNOSIS — N185 Chronic kidney disease, stage 5: Secondary | ICD-10-CM | POA: Insufficient documentation

## 2023-08-19 DIAGNOSIS — D631 Anemia in chronic kidney disease: Secondary | ICD-10-CM | POA: Insufficient documentation

## 2023-08-19 LAB — POCT HEMOGLOBIN-HEMACUE: Hemoglobin: 9.6 g/dL — ABNORMAL LOW (ref 13.0–17.0)

## 2023-08-19 MED ORDER — EPOETIN ALFA-EPBX 10000 UNIT/ML IJ SOLN
8000.0000 [IU] | INTRAMUSCULAR | Status: DC
Start: 2023-08-19 — End: 2023-08-20
  Administered 2023-08-19: 8000 [IU] via SUBCUTANEOUS

## 2023-08-19 MED ORDER — EPOETIN ALFA-EPBX 10000 UNIT/ML IJ SOLN
INTRAMUSCULAR | Status: AC
  Filled 2023-08-19: qty 1

## 2023-08-22 DIAGNOSIS — I5022 Chronic systolic (congestive) heart failure: Secondary | ICD-10-CM | POA: Diagnosis not present

## 2023-08-22 DIAGNOSIS — I130 Hypertensive heart and chronic kidney disease with heart failure and stage 1 through stage 4 chronic kidney disease, or unspecified chronic kidney disease: Secondary | ICD-10-CM | POA: Diagnosis not present

## 2023-08-22 DIAGNOSIS — I088 Other rheumatic multiple valve diseases: Secondary | ICD-10-CM | POA: Diagnosis not present

## 2023-08-22 DIAGNOSIS — N184 Chronic kidney disease, stage 4 (severe): Secondary | ICD-10-CM | POA: Diagnosis not present

## 2023-08-22 DIAGNOSIS — D631 Anemia in chronic kidney disease: Secondary | ICD-10-CM | POA: Diagnosis not present

## 2023-08-22 DIAGNOSIS — I2510 Atherosclerotic heart disease of native coronary artery without angina pectoris: Secondary | ICD-10-CM | POA: Diagnosis not present

## 2023-08-22 DIAGNOSIS — E7800 Pure hypercholesterolemia, unspecified: Secondary | ICD-10-CM | POA: Diagnosis not present

## 2023-08-22 DIAGNOSIS — K219 Gastro-esophageal reflux disease without esophagitis: Secondary | ICD-10-CM | POA: Diagnosis not present

## 2023-08-22 DIAGNOSIS — N179 Acute kidney failure, unspecified: Secondary | ICD-10-CM | POA: Diagnosis not present

## 2023-09-02 ENCOUNTER — Encounter (HOSPITAL_COMMUNITY)
Admission: RE | Admit: 2023-09-02 | Discharge: 2023-09-02 | Disposition: A | Discharge status: Home / Self Care | Payer: Medicare HMO | Source: Ambulatory Visit | Attending: Nephrology | Admitting: Nephrology

## 2023-09-02 VITALS — BP 163/84 | HR 73 | Temp 97.6°F | Resp 17

## 2023-09-02 DIAGNOSIS — D631 Anemia in chronic kidney disease: Secondary | ICD-10-CM | POA: Diagnosis not present

## 2023-09-02 DIAGNOSIS — I509 Heart failure, unspecified: Secondary | ICD-10-CM | POA: Diagnosis not present

## 2023-09-02 DIAGNOSIS — N185 Chronic kidney disease, stage 5: Secondary | ICD-10-CM | POA: Diagnosis not present

## 2023-09-02 DIAGNOSIS — R0602 Shortness of breath: Secondary | ICD-10-CM | POA: Diagnosis not present

## 2023-09-02 LAB — POCT HEMOGLOBIN-HEMACUE: Hemoglobin: 9.4 g/dL — ABNORMAL LOW (ref 13.0–17.0)

## 2023-09-02 MED ORDER — EPOETIN ALFA-EPBX 10000 UNIT/ML IJ SOLN
8000.0000 [IU] | INTRAMUSCULAR | Status: DC
Start: 2023-09-02 — End: 2024-09-14
  Administered 2023-09-02: 8000 [IU] via SUBCUTANEOUS

## 2023-09-02 MED ORDER — EPOETIN ALFA-EPBX 10000 UNIT/ML IJ SOLN
INTRAMUSCULAR | Status: AC
  Filled 2023-09-02: qty 8

## 2023-09-03 DIAGNOSIS — N185 Chronic kidney disease, stage 5: Secondary | ICD-10-CM | POA: Diagnosis not present

## 2023-09-03 DIAGNOSIS — Z6827 Body mass index (BMI) 27.0-27.9, adult: Secondary | ICD-10-CM | POA: Diagnosis not present

## 2023-09-03 DIAGNOSIS — I509 Heart failure, unspecified: Secondary | ICD-10-CM | POA: Diagnosis not present

## 2023-09-03 DIAGNOSIS — I10 Essential (primary) hypertension: Secondary | ICD-10-CM | POA: Diagnosis not present

## 2023-09-04 NOTE — Progress Notes (Signed)
Remote pacemaker transmission.   

## 2023-09-06 DIAGNOSIS — H353132 Nonexudative age-related macular degeneration, bilateral, intermediate dry stage: Secondary | ICD-10-CM | POA: Diagnosis not present

## 2023-09-06 DIAGNOSIS — H52203 Unspecified astigmatism, bilateral: Secondary | ICD-10-CM | POA: Diagnosis not present

## 2023-09-06 DIAGNOSIS — Z961 Presence of intraocular lens: Secondary | ICD-10-CM | POA: Diagnosis not present

## 2023-09-09 DIAGNOSIS — K219 Gastro-esophageal reflux disease without esophagitis: Secondary | ICD-10-CM | POA: Diagnosis not present

## 2023-09-09 DIAGNOSIS — I130 Hypertensive heart and chronic kidney disease with heart failure and stage 1 through stage 4 chronic kidney disease, or unspecified chronic kidney disease: Secondary | ICD-10-CM | POA: Diagnosis not present

## 2023-09-09 DIAGNOSIS — N184 Chronic kidney disease, stage 4 (severe): Secondary | ICD-10-CM | POA: Diagnosis not present

## 2023-09-09 DIAGNOSIS — E7800 Pure hypercholesterolemia, unspecified: Secondary | ICD-10-CM | POA: Diagnosis not present

## 2023-09-09 DIAGNOSIS — D631 Anemia in chronic kidney disease: Secondary | ICD-10-CM | POA: Diagnosis not present

## 2023-09-09 DIAGNOSIS — I2510 Atherosclerotic heart disease of native coronary artery without angina pectoris: Secondary | ICD-10-CM | POA: Diagnosis not present

## 2023-09-09 DIAGNOSIS — I5022 Chronic systolic (congestive) heart failure: Secondary | ICD-10-CM | POA: Diagnosis not present

## 2023-09-09 DIAGNOSIS — I088 Other rheumatic multiple valve diseases: Secondary | ICD-10-CM | POA: Diagnosis not present

## 2023-09-09 DIAGNOSIS — N179 Acute kidney failure, unspecified: Secondary | ICD-10-CM | POA: Diagnosis not present

## 2023-09-13 DIAGNOSIS — D631 Anemia in chronic kidney disease: Secondary | ICD-10-CM | POA: Diagnosis not present

## 2023-09-13 DIAGNOSIS — N185 Chronic kidney disease, stage 5: Secondary | ICD-10-CM | POA: Diagnosis not present

## 2023-09-13 DIAGNOSIS — I509 Heart failure, unspecified: Secondary | ICD-10-CM | POA: Diagnosis not present

## 2023-09-13 DIAGNOSIS — N2581 Secondary hyperparathyroidism of renal origin: Secondary | ICD-10-CM | POA: Diagnosis not present

## 2023-09-13 DIAGNOSIS — N189 Chronic kidney disease, unspecified: Secondary | ICD-10-CM | POA: Diagnosis not present

## 2023-09-13 DIAGNOSIS — I132 Hypertensive heart and chronic kidney disease with heart failure and with stage 5 chronic kidney disease, or end stage renal disease: Secondary | ICD-10-CM | POA: Diagnosis not present

## 2023-09-13 DIAGNOSIS — R630 Anorexia: Secondary | ICD-10-CM | POA: Diagnosis not present

## 2023-09-16 ENCOUNTER — Encounter (HOSPITAL_COMMUNITY): Payer: Medicare HMO

## 2023-09-17 ENCOUNTER — Encounter (HOSPITAL_COMMUNITY)
Admission: RE | Admit: 2023-09-17 | Discharge: 2023-09-17 | Disposition: A | Discharge status: Home / Self Care | Payer: Medicare HMO | Source: Ambulatory Visit | Attending: Nephrology | Admitting: Nephrology

## 2023-09-17 VITALS — BP 153/64 | HR 65 | Temp 97.5°F | Resp 17

## 2023-09-17 DIAGNOSIS — N185 Chronic kidney disease, stage 5: Secondary | ICD-10-CM

## 2023-09-17 DIAGNOSIS — D631 Anemia in chronic kidney disease: Secondary | ICD-10-CM | POA: Diagnosis not present

## 2023-09-17 DIAGNOSIS — H524 Presbyopia: Secondary | ICD-10-CM | POA: Diagnosis not present

## 2023-09-17 LAB — IRON AND TIBC
Iron: 108 ug/dL (ref 45–182)
Saturation Ratios: 58 % — ABNORMAL HIGH (ref 17.9–39.5)
TIBC: 188 ug/dL — ABNORMAL LOW (ref 250–450)
UIBC: 80 ug/dL

## 2023-09-17 LAB — FERRITIN: Ferritin: 359 ng/mL — ABNORMAL HIGH (ref 24–336)

## 2023-09-17 LAB — POCT HEMOGLOBIN-HEMACUE: Hemoglobin: 8.6 g/dL — ABNORMAL LOW (ref 13.0–17.0)

## 2023-09-17 MED ORDER — EPOETIN ALFA-EPBX 3000 UNIT/ML IJ SOLN
INTRAMUSCULAR | Status: AC
  Administered 2023-09-17: 2000 [IU] via SUBCUTANEOUS
  Filled 2023-09-17: qty 1

## 2023-09-17 MED ORDER — EPOETIN ALFA-EPBX 10000 UNIT/ML IJ SOLN
INTRAMUSCULAR | Status: AC
  Administered 2023-09-17: 10000 [IU] via SUBCUTANEOUS
  Filled 2023-09-17: qty 1

## 2023-09-17 MED ORDER — EPOETIN ALFA-EPBX 10000 UNIT/ML IJ SOLN
12000.0000 [IU] | INTRAMUSCULAR | Status: DC
Start: 2023-09-17 — End: 2024-09-29

## 2023-09-28 ENCOUNTER — Inpatient Hospital Stay (HOSPITAL_COMMUNITY)
Admission: EM | Admit: 2023-09-28 | Discharge: 2023-09-29 | DRG: 951 | Disposition: A | Discharge status: Hospice | Payer: Medicare HMO | Attending: Internal Medicine | Admitting: Internal Medicine

## 2023-09-28 ENCOUNTER — Encounter (HOSPITAL_COMMUNITY): Payer: Self-pay | Admitting: *Deleted

## 2023-09-28 ENCOUNTER — Emergency Department (HOSPITAL_COMMUNITY): Payer: Medicare HMO

## 2023-09-28 DIAGNOSIS — I2510 Atherosclerotic heart disease of native coronary artery without angina pectoris: Secondary | ICD-10-CM | POA: Diagnosis present

## 2023-09-28 DIAGNOSIS — R0902 Hypoxemia: Secondary | ICD-10-CM | POA: Diagnosis present

## 2023-09-28 DIAGNOSIS — R0989 Other specified symptoms and signs involving the circulatory and respiratory systems: Secondary | ICD-10-CM | POA: Diagnosis not present

## 2023-09-28 DIAGNOSIS — Z1152 Encounter for screening for COVID-19: Secondary | ICD-10-CM

## 2023-09-28 DIAGNOSIS — Z8546 Personal history of malignant neoplasm of prostate: Secondary | ICD-10-CM | POA: Diagnosis not present

## 2023-09-28 DIAGNOSIS — Z833 Family history of diabetes mellitus: Secondary | ICD-10-CM

## 2023-09-28 DIAGNOSIS — Z66 Do not resuscitate: Secondary | ICD-10-CM | POA: Diagnosis present

## 2023-09-28 DIAGNOSIS — Z79899 Other long term (current) drug therapy: Secondary | ICD-10-CM

## 2023-09-28 DIAGNOSIS — Z950 Presence of cardiac pacemaker: Secondary | ICD-10-CM | POA: Diagnosis not present

## 2023-09-28 DIAGNOSIS — N185 Chronic kidney disease, stage 5: Secondary | ICD-10-CM | POA: Diagnosis not present

## 2023-09-28 DIAGNOSIS — Z7901 Long term (current) use of anticoagulants: Secondary | ICD-10-CM

## 2023-09-28 DIAGNOSIS — I2489 Other forms of acute ischemic heart disease: Secondary | ICD-10-CM | POA: Diagnosis not present

## 2023-09-28 DIAGNOSIS — I5041 Acute combined systolic (congestive) and diastolic (congestive) heart failure: Secondary | ICD-10-CM | POA: Diagnosis present

## 2023-09-28 DIAGNOSIS — N179 Acute kidney failure, unspecified: Secondary | ICD-10-CM | POA: Diagnosis not present

## 2023-09-28 DIAGNOSIS — I129 Hypertensive chronic kidney disease with stage 1 through stage 4 chronic kidney disease, or unspecified chronic kidney disease: Secondary | ICD-10-CM | POA: Diagnosis not present

## 2023-09-28 DIAGNOSIS — Z8616 Personal history of COVID-19: Secondary | ICD-10-CM | POA: Diagnosis not present

## 2023-09-28 DIAGNOSIS — Z515 Encounter for palliative care: Secondary | ICD-10-CM | POA: Diagnosis not present

## 2023-09-28 DIAGNOSIS — Z634 Disappearance and death of family member: Secondary | ICD-10-CM | POA: Diagnosis not present

## 2023-09-28 DIAGNOSIS — Z801 Family history of malignant neoplasm of trachea, bronchus and lung: Secondary | ICD-10-CM

## 2023-09-28 DIAGNOSIS — Z87442 Personal history of urinary calculi: Secondary | ICD-10-CM

## 2023-09-28 DIAGNOSIS — E872 Acidosis, unspecified: Secondary | ICD-10-CM | POA: Diagnosis present

## 2023-09-28 DIAGNOSIS — Z955 Presence of coronary angioplasty implant and graft: Secondary | ICD-10-CM

## 2023-09-28 DIAGNOSIS — Z8249 Family history of ischemic heart disease and other diseases of the circulatory system: Secondary | ICD-10-CM | POA: Diagnosis not present

## 2023-09-28 DIAGNOSIS — D631 Anemia in chronic kidney disease: Secondary | ICD-10-CM | POA: Diagnosis present

## 2023-09-28 DIAGNOSIS — I5043 Acute on chronic combined systolic (congestive) and diastolic (congestive) heart failure: Secondary | ICD-10-CM | POA: Diagnosis present

## 2023-09-28 DIAGNOSIS — R627 Adult failure to thrive: Secondary | ICD-10-CM | POA: Diagnosis present

## 2023-09-28 DIAGNOSIS — I10 Essential (primary) hypertension: Secondary | ICD-10-CM | POA: Diagnosis not present

## 2023-09-28 DIAGNOSIS — I132 Hypertensive heart and chronic kidney disease with heart failure and with stage 5 chronic kidney disease, or end stage renal disease: Secondary | ICD-10-CM | POA: Diagnosis not present

## 2023-09-28 DIAGNOSIS — R531 Weakness: Secondary | ICD-10-CM | POA: Diagnosis not present

## 2023-09-28 DIAGNOSIS — Z555 Less than a high school diploma: Secondary | ICD-10-CM | POA: Diagnosis not present

## 2023-09-28 DIAGNOSIS — Z7189 Other specified counseling: Secondary | ICD-10-CM | POA: Diagnosis not present

## 2023-09-28 DIAGNOSIS — Z789 Other specified health status: Secondary | ICD-10-CM | POA: Diagnosis not present

## 2023-09-28 DIAGNOSIS — R0602 Shortness of breath: Secondary | ICD-10-CM | POA: Diagnosis not present

## 2023-09-28 LAB — CBC WITH DIFFERENTIAL/PLATELET
Abs Immature Granulocytes: 0.11 10*3/uL — ABNORMAL HIGH (ref 0.00–0.07)
Basophils Absolute: 0 10*3/uL (ref 0.0–0.1)
Basophils Relative: 0 %
Eosinophils Absolute: 0 10*3/uL (ref 0.0–0.5)
Eosinophils Relative: 0 %
HCT: 27.2 % — ABNORMAL LOW (ref 39.0–52.0)
Hemoglobin: 8.6 g/dL — ABNORMAL LOW (ref 13.0–17.0)
Immature Granulocytes: 2 %
Lymphocytes Relative: 15 %
Lymphs Abs: 0.8 10*3/uL (ref 0.7–4.0)
MCH: 30.4 pg (ref 26.0–34.0)
MCHC: 31.6 g/dL (ref 30.0–36.0)
MCV: 96.1 fL (ref 80.0–100.0)
Monocytes Absolute: 0.4 10*3/uL (ref 0.1–1.0)
Monocytes Relative: 8 %
Neutro Abs: 4.2 10*3/uL (ref 1.7–7.7)
Neutrophils Relative %: 75 %
Platelets: 144 10*3/uL — ABNORMAL LOW (ref 150–400)
RBC: 2.83 MIL/uL — ABNORMAL LOW (ref 4.22–5.81)
RDW: 16.2 % — ABNORMAL HIGH (ref 11.5–15.5)
WBC: 5.5 10*3/uL (ref 4.0–10.5)
nRBC: 0.9 % — ABNORMAL HIGH (ref 0.0–0.2)

## 2023-09-28 LAB — COMPREHENSIVE METABOLIC PANEL
ALT: 18 U/L (ref 0–44)
AST: 26 U/L (ref 15–41)
Albumin: 3 g/dL — ABNORMAL LOW (ref 3.5–5.0)
Alkaline Phosphatase: 55 U/L (ref 38–126)
Anion gap: 24 — ABNORMAL HIGH (ref 5–15)
BUN: 174 mg/dL — ABNORMAL HIGH (ref 8–23)
CO2: 8 mmol/L — ABNORMAL LOW (ref 22–32)
Calcium: 7.3 mg/dL — ABNORMAL LOW (ref 8.9–10.3)
Chloride: 109 mmol/L (ref 98–111)
Creatinine, Ser: 15.84 mg/dL — ABNORMAL HIGH (ref 0.61–1.24)
GFR, Estimated: 3 mL/min — ABNORMAL LOW (ref >60)
Glucose, Bld: 111 mg/dL — ABNORMAL HIGH (ref 70–99)
Potassium: 5 mmol/L (ref 3.5–5.1)
Sodium: 141 mmol/L (ref 135–145)
Total Bilirubin: 1 mg/dL (ref <1.2)
Total Protein: 6 g/dL — ABNORMAL LOW (ref 6.5–8.1)

## 2023-09-28 LAB — I-STAT CG4 LACTIC ACID, ED: Lactic Acid, Venous: 1 mmol/L (ref 0.5–1.9)

## 2023-09-28 LAB — TROPONIN I (HIGH SENSITIVITY)
Troponin I (High Sensitivity): 1570 ng/L (ref <18)
Troponin I (High Sensitivity): 1590 ng/L (ref <18)

## 2023-09-28 LAB — RESP PANEL BY RT-PCR (RSV, FLU A&B, COVID)  RVPGX2
Influenza A by PCR: NEGATIVE
Influenza B by PCR: NEGATIVE
Resp Syncytial Virus by PCR: NEGATIVE
SARS Coronavirus 2 by RT PCR: NEGATIVE

## 2023-09-28 LAB — TSH: TSH: 2.83 u[IU]/mL (ref 0.350–4.500)

## 2023-09-28 LAB — BRAIN NATRIURETIC PEPTIDE: B Natriuretic Peptide: >4500 pg/mL — ABNORMAL HIGH (ref 0.0–100.0)

## 2023-09-28 MED ORDER — LORAZEPAM 2 MG/ML IJ SOLN: 1.0000 mg | INTRAMUSCULAR | Status: DC | PRN

## 2023-09-28 MED ORDER — HYDROMORPHONE HCL 1 MG/ML IJ SOLN: 0.5000 mg | INTRAMUSCULAR | Status: DC | PRN

## 2023-09-28 MED ORDER — ONDANSETRON HCL 4 MG/2ML IJ SOLN: 4.0000 mg | Freq: Four times a day (QID) | INTRAMUSCULAR | Status: DC | PRN

## 2023-09-28 MED ORDER — ACETAMINOPHEN 325 MG PO TABS: 650.0000 mg | ORAL_TABLET | Freq: Four times a day (QID) | ORAL | Status: DC | PRN

## 2023-09-28 MED ORDER — MORPHINE SULFATE (PF) 2 MG/ML IV SOLN: 2.0000 mg | INTRAVENOUS | Status: DC | PRN

## 2023-09-28 MED ORDER — POLYVINYL ALCOHOL 1.4 % OP SOLN: 1.0000 [drp] | Freq: Four times a day (QID) | OPHTHALMIC | Status: DC | PRN

## 2023-09-28 MED ORDER — LORAZEPAM 2 MG/ML PO CONC: 1.0000 mg | ORAL | Status: DC | PRN

## 2023-09-28 MED ORDER — LORAZEPAM 1 MG PO TABS: 1.0000 mg | ORAL_TABLET | ORAL | Status: DC | PRN

## 2023-09-28 MED ORDER — GLYCOPYRROLATE 0.2 MG/ML IJ SOLN: 0.2000 mg | INTRAMUSCULAR | Status: DC | PRN

## 2023-09-28 MED ORDER — GLYCOPYRROLATE 1 MG PO TABS: 1.0000 mg | ORAL_TABLET | ORAL | Status: DC | PRN

## 2023-09-28 MED ORDER — ACETAMINOPHEN 650 MG RE SUPP: 650.0000 mg | Freq: Four times a day (QID) | RECTAL | Status: DC | PRN

## 2023-09-28 NOTE — ED Notes (Signed)
Lab at [facility], pt alert, NAD, calm, interactive.

## 2023-09-28 NOTE — H&P (Signed)
History and Physical    Patient: Steven Rosario:[id number] DOB: 1929/09/17 DOA: 09/28/2023 DOS: the patient was seen and examined on 09/28/2023 PCP: Steven Auer, MD  Patient coming from: Home  Chief Complaint:  Chief Complaint  Patient presents with   Shortness of Breath   HPI: Steven Rosario is a 89 y.o. male with medical history significant of stage V CKD, combined systolic and diastolic heart failure, CAD, complete heart block status post pacemaker, anemia of chronic disease, hypertension presenting to the hospital for failure to thrive and shortness of breath.  Patient lives alone with son staying about 5 minutes away.  He was found by family with generalized weakness and decreased appetite along with failure to thrive and shortness of breath.  Family called EMS who found that patient was hypoxic to 88% and placed him on 2 L nasal cannula.  Per patient and family, he has experienced declining health over the last 3 to 4 weeks.  His appetite has diminished significantly and he has experienced generalized weakness.  He also notes 1 episode of emesis earlier today.  Per family, patient saw nephrology 2 weeks ago with worsening kidney function though patient has opted not to pursue dialysis.  Patient and family would like to pursue comfort focused care near end-of-life.  Son states that they discussed hospice care with patient and feel that this is appropriate.  Patient states that he is ready to go see his wife who passed away 6 years ago.  Patient denies any current pain, fevers, chills, cough, chest pain, palpitations, abdominal pain, urinary changes.  Patient would like to pursue hospice at home.  We discussed an observation stay until the appropriate arrangements are made at home for him.  Patient is alert and oriented x 3 and has capacity to make his own decisions.    Review of Systems: As mentioned in the history of present illness. All other systems reviewed and are  negative. Past Medical History:  Diagnosis Date   Acute kidney injury (AKI) with acute tubular necrosis (ATN) (HCC)    Cervical disc disease    Chronic kidney disease    Coronary artery disease    Gout    Hypertension    Nephrolithiasis    Pneumonia due to COVID-19 virus    Presence of stent in LAD coronary artery    Prostate cancer (HCC)    S/P placement of cardiac pacemaker    Urinary incontinence    Past Surgical History:  Procedure Laterality Date   ANKLE FRACTURE SURGERY Right    cardiac stents x2     caudal epidural injection     CERVICAL DISCECTOMY     COLONOSCOPY     COLONOSCOPY WITH PROPOFOL N/A 12/01/2022   Procedure: COLONOSCOPY WITH PROPOFOL;  Surgeon: Jeani Hawking, MD;  Location: [facility] ENDOSCOPY;  Service: Gastroenterology;  Laterality: N/A;   ESOPHAGOGASTRODUODENOSCOPY (EGD) WITH PROPOFOL N/A 11/30/2022   Procedure: ESOPHAGOGASTRODUODENOSCOPY (EGD) WITH PROPOFOL;  Surgeon: Imogene Burn, MD;  Location: [facility] ENDOSCOPY;  Service: Gastroenterology;  Laterality: N/A;   EXPLORATORY LAPAROTOMY     HEMOSTASIS CLIP PLACEMENT  12/01/2022   Procedure: HEMOSTASIS CLIP PLACEMENT;  Surgeon: Jeani Hawking, MD;  Location: [facility] ENDOSCOPY;  Service: Gastroenterology;;   lumbar facet joint injection Bilateral    medical branch block Bilateral    POLYPECTOMY  12/01/2022   Procedure: POLYPECTOMY;  Surgeon: Jeani Hawking, MD;  Location: [facility] ENDOSCOPY;  Service: Gastroenterology;;   PROSTATECTOMY     RADIOFREQUENCY ABLATION Bilateral  ROTATOR CUFF REPAIR Right    SACROILIAC JOINT ARTHRODESIS Right    SACROILIAC JOINT ARTHRODESIS Bilateral    sacroilliac joint inj     SPIROMETRY     Social History:  reports that he has never smoked. He has never used smokeless tobacco. He reports that he does not currently use alcohol. He reports that he does not use drugs.  No Known Allergies  Family History  Problem Relation Age of Onset   Diabetes Mother    Obesity Mother    Heart failure Mother     CAD Father    Bone cancer Father    Heart disease Sister    Obesity Brother    Lung cancer Son     Prior to Admission medications   Medication Sig Start Date End Date Taking? Authorizing Provider  amLODipine (NORVASC) 5 MG tablet Take 10 mg by mouth daily. 03/29/22   [provider]  apixaban (ELIQUIS) 2.5 MG TABS tablet Take 2.5 mg by mouth 2 (two) times daily. Pt taking 1 time a day    [provider]  atorvastatin (LIPITOR) 20 MG tablet Take 20 mg by mouth daily.    [provider]  benzonatate (TESSALON) 100 MG capsule Take 100 mg by mouth 3 (three) times daily as needed. 07/05/23   [provider]  carvedilol (COREG) 12.5 MG tablet Take 1 tablet (12.5 mg total) by mouth 2 (two) times daily. 07/17/21   Camnitz, Andree Coss, MD  cyanocobalamin (VITAMIN B12) 1000 MCG tablet Take 1,000 mcg by mouth daily.    [provider]  ferrous sulfate 325 (65 FE) MG EC tablet Take 325 mg by mouth 3 (three) times a week.    [provider]  FIBER PO Take 1 tablet by mouth daily.    [provider]  furosemide (LASIX) 40 MG tablet Take 1 tablet (40 mg total) by mouth daily. Please dont take lasix if weight is less than 181 pounds. 12/14/22   Clegg, Amy D, NP  hydrALAZINE (APRESOLINE) 25 MG tablet TAKE 1 AND 1/2 TABLETS THREE TIMES DAILY Patient taking differently: Take 7.5 mg by mouth 3 (three) times daily. 03/14/23   Camnitz, Andree Coss, MD  isosorbide dinitrate (ISORDIL) 20 MG tablet TAKE 1 TABLET THREE TIMES DAILY 03/14/23   Camnitz, Andree Coss, MD  Multiple Vitamins-Minerals (CENTRUM SILVER PO) Take by mouth.    [provider]  Multiple Vitamins-Minerals (PRESERVISION AREDS 2) CAPS Take 1 capsule by mouth 3 (three) times daily.    [provider]  potassium chloride (MICRO-K) 10 MEQ CR capsule Take 10 mEq by mouth daily. 11/08/22   [provider]  sodium bicarbonate 650 MG tablet Take 1 tablet (650 mg total) by  mouth 2 (two) times daily. 07/19/23 07/18/24  [name], MD    Physical Exam: Vitals:   09/28/23 1335 09/28/23 1337 09/28/23 1357  BP: (!) 154/86    Pulse: 60    Resp: 15    Temp: (!) 97.5 F (36.4 C)    TempSrc: Oral    SpO2: 100% (!) 88%   Weight:   77.1 kg   Physical Exam Constitutional:      Appearance: He is well-developed.     Comments: Chronically ill-appearing elderly male, comfortably laying in bed, NAD.  HENT:     Head: Normocephalic and atraumatic.     Mouth/Throat:     Pharynx: Oropharynx is clear.  Eyes:     Extraocular Movements: Extraocular movements intact.  Pupils: Pupils are equal, round, and reactive to light.  Cardiovascular:     Rate and Rhythm: Normal rate and regular rhythm.     Pulses: Normal pulses.     Heart sounds: Normal heart sounds. No murmur heard.    No friction rub. No gallop.  Pulmonary:     Effort: Pulmonary effort is normal. No tachypnea.     Breath sounds: Examination of the right-lower field reveals rales. Examination of the left-lower field reveals rales. Rales present. No wheezing or rhonchi.  Abdominal:     General: Bowel sounds are normal.     Palpations: Abdomen is soft.     Tenderness: There is no abdominal tenderness. There is no guarding or rebound.  Musculoskeletal:     Comments: 1+ edema in bilateral lower extremities  Skin:    General: Skin is warm and dry.  Neurological:     General: No focal deficit present.     Mental Status: He is alert and oriented to person, place, and time.  Psychiatric:        Mood and Affect: Mood normal.        Behavior: Behavior normal.     Data Reviewed:  There are no new results to review at this time.  Assessment and Plan: No notes have been filed under this hospital service. Service: Hospitalist  Comfort Care Acute renal failure superimposed on CKD V Anion gap metabolic acidosis Combined systolic and diastolic heart failure Complete heart block s/p  pacemaker Elevated troponins Anemia of chronic disease Patient presenting with acute on chronic renal failure.  His creatinine is 15.84, up from previous baseline of around 5.7.  CMP also reveals a significant anion gap metabolic acidosis, likely from underlying kidney failure.  Patient's BNP is also significantly elevated, likely component of cardiorenal syndrome and overall picture.  His troponins are in the 1500s, but EKG does not show any active ischemic changes.  This is likely all from demand ischemia.  His anemia of chronic renal disease appears to be at baseline, though down 1 point from about a month ago.  All in all, patient with end-stage renal failure and does not wish to pursue dialysis.  Patient is fully oriented and able to express that he would like to pursue comfort focused care preferably with hospice at home.  Discussed with TOC and palliative care team to help arrange this.  In the meantime, we will admit patient for observation stay and provide comfort focused care while here until appropriate outpatient resources are in place.  Son, Rembert, is the main point of contact and patient's primary caregiver. -Appreciate TOC and palliative care support and assistance with setting up home hospice -DNR - comfort care -Avoid aggressive interventions as able -Dilaudid 0.5 mg every 3 hours as needed for pain control -Lorazepam every 4 hours as needed for anxiety -Zofran every 6 hours as needed for nausea vomiting -Glycopyrrolate every 4 hours as needed for secretions -Liquifilm Tears for dry eyes -Unrestricted visitation status     Advance Care Planning:   Code Status: Do not attempt resuscitation (DNR) - Comfort care Discussed with patient and family at bedside. Patient's wishes are to be DNR and pursue comfort care measures. He is AAOx3 and able to make this decision. He would like to pursue home hospice. Discussed with TOC and palliative care team to help arrange this.   Consults:  palliative care  Family Communication: updated family at bedside  Severity of Illness: The appropriate patient status for this  patient is OBSERVATION. Observation status is judged to be reasonable and necessary in order to provide the required intensity of service to ensure the patient's safety. The patient's presenting symptoms, physical exam findings, and initial radiographic and laboratory data in the context of their medical condition is felt to place them at decreased risk for further clinical deterioration. Furthermore, it is anticipated that the patient will be medically stable for discharge from the hospital within 2 midnights of admission.   Author: Briscoe Burns, MD 09/28/2023 5:53 PM  For on call review [email or web address].

## 2023-09-28 NOTE — Progress Notes (Signed)
     Consult received and chart reviewed. Discussed with Dr. Austin Miles.     Sherlean Foot, NP-C Palliative Medicine   Please call Palliative Medicine team phone with any questions [phone]. For individual providers please see AMION.   No charge

## 2023-09-28 NOTE — ED Notes (Addendum)
Trop elevated, 1570. EDP aware. EDP at [facility] speaking with pt and family. EDP d/w pt & family x2 care/tx plan, including doing nothing, palliative care.

## 2023-09-28 NOTE — ED Notes (Signed)
Pt in xray, not in assigned area h/w 21.

## 2023-09-28 NOTE — ED Notes (Signed)
Pt pending bed assignment, will be hospice/ palliative care.

## 2023-09-28 NOTE — ED Notes (Signed)
EDP at BS 

## 2023-09-28 NOTE — Care Management (Signed)
Transition of Care [facility]) - Emergency Department Mini Assessment   Patient Details  Name: Steven Rosario MRN: [id number] Date of Birth: 03/25/1929  [facility] CM/SW Contact:    Lockie Pares, RN Phone Number: 09/28/2023, 4:49 PM   Clinical Narrative: Patient presented to the ED with [facility], weakness. Patient verbalized readiness for hospice care. Called and spoke to Katheren Puller, patient son. He has been upset with palliative care just calling patient and not checking on him physically. He states he does not wan to do this "on the phone"  that his father is in the hallway and is not private. He has " no idea" who the palliative care agency is that his father has been receiving. Messaged with MD, RN and charge RN about patient placement and  will email list of agencies that reps can come speak with  patient and family at bedside for hospice. Patient son has questions about cost. I did tell him that they would only check on patient family would be providing any care, he stated that will not work, discussed hiring caregivers or residential hospice. The patient does not want residential hospice,  Sending RN print off of Medicare.gov hospice agencies to share with family.   ED Mini Assessment: What brought you to the Emergency Department? : renal Failure     Barrier interventions: Family feels they cannot care for him adequetly.          Patient Contact and Communications        ,                 Admission diagnosis:  Acute renal failure superimposed on stage 5 chronic kidney disease, not on chronic dialysis, unspecified acute renal failure type (HCC) [N17.9, N18.5] Patient Active Problem List   Diagnosis Date Noted   Acute renal failure superimposed on stage 5 chronic kidney disease, not on chronic dialysis, unspecified acute renal failure type (HCC) 09/28/2023   Syncope, vasovagal 07/17/2023   Upper respiratory infection 07/17/2023   Heart failure with  reduced ejection fraction (HCC) 07/17/2023   Anemia of chronic kidney failure 07/17/2023   Chronic kidney disease (CKD), stage IV (severe) (HCC) 12/03/2022   Occult GI bleeding 11/29/2022   Renal dysfunction 11/29/2022   Acute combined systolic and diastolic heart failure (HCC) 11/28/2022   Arrhythmia 11/02/2022   Acute kidney injury superimposed on chronic kidney disease (HCC)    Coronary artery disease    Essential hypertension, benign 11/17/2019   Hypercholesteremia 11/17/2019   Pacemaker [employer] device implant date 08/23/2017 11/17/2019   Senile arthritis 11/17/2019   Esophageal reflux 11/17/2019   Pernicious anemia 11/17/2019   Gout, unspecified 11/17/2019   PCP:  Lise Auer, MD Pharmacy:   [facility] - [place], [place] - [address] [place] [address] Phone: [phone] Fax: [phone]  [facility] [address] [place] [address] Phone: [phone] Fax: [phone]

## 2023-09-28 NOTE — ED Triage Notes (Signed)
BIB [employer] from home for sob, general weakness, decreased apetite, failure to thrive. DNR and MOST present. Lives alone. Per EMS: Pt and family verbalized ready for hospice, palliative care, endo of life care if needed. Pt HOH. SPO2 was 88% on RA, 100% on 2L [place]. BS 171. A&O per baseline, confused on time. Reported h/o 4% kidney function. Declining over last week. Family noted change over christmas, son called EMS, son will be here in ~ 1 hr.

## 2023-09-28 NOTE — ED Provider Notes (Signed)
[place] EMERGENCY DEPARTMENT AT [facility] Provider Note   CSN: [id number] Arrival date & time: 09/28/23  1325     History  Chief Complaint  Patient presents with   Shortness of Breath    Steven Rosario is a 89 y.o. male.   Shortness of Breath 89 year old male history of hypertension, complete heart block status post pacemaker, heart failure, CAD, chronic anemia, CKD 4 presenting for failure to thrive and shortness of breath.  Per EMS report they were called out as patient lives alone and was found by family with generalized weakness, decreased appetite, failure to thrive and some shortness of breath.  Reportedly family and patient verbalized readiness for hospice/palliative care if needed.  Was hypoxic to 88% requiring 2 L nasal cannula with EMS.  Patient states he has had declining health over the last 3 to 4 weeks.  He said generalized fatigue, generalized weakness, poor appetite.  Had 1 episode of emesis this morning.  Is not any chest pain, does feel somewhat short of breath.  Denies any abdominal pain.  No change in bowel movements.  No fevers or chills or cough.  He reports he has not taken his medications for couple days.  I asked him if he and his family had talked about hospice, he stated he had not.  Collateral from son [name]: Several weeks poor appetite, lower activity.  Saw nephrology 2 weeks ago with worsening kidney function.  Patient called son this AM to get him some food.  Very fatigued.  Has been getting blood transfusions for chronic anemia.  Recently switched from Lasix to torsemide, but not taking his medications.  Son is concerned that he is near end-of-life, they have tried to work with palliative care but not had much progress.  Son states patient brought up hospice care last night and then stated he is ready to go see his wife who passed 6 years ago.     Home Medications Prior to Admission medications   Medication Sig Start Date End Date Taking?  Authorizing Provider  amLODipine (NORVASC) 5 MG tablet Take 10 mg by mouth daily. 03/29/22   [provider]  apixaban (ELIQUIS) 2.5 MG TABS tablet Take 2.5 mg by mouth 2 (two) times daily. Pt taking 1 time a day    [provider]  atorvastatin (LIPITOR) 20 MG tablet Take 20 mg by mouth daily.    [provider]  benzonatate (TESSALON) 100 MG capsule Take 100 mg by mouth 3 (three) times daily as needed. 07/05/23   [provider]  carvedilol (COREG) 12.5 MG tablet Take 1 tablet (12.5 mg total) by mouth 2 (two) times daily. 07/17/21   Camnitz, Andree Coss, MD  cyanocobalamin (VITAMIN B12) 1000 MCG tablet Take 1,000 mcg by mouth daily.    [provider]  ferrous sulfate 325 (65 FE) MG EC tablet Take 325 mg by mouth 3 (three) times a week.    [provider]  FIBER PO Take 1 tablet by mouth daily.    [provider]  furosemide (LASIX) 40 MG tablet Take 1 tablet (40 mg total) by mouth daily. Please dont take lasix if weight is less than 181 pounds. 12/14/22   Clegg, Amy D, NP  hydrALAZINE (APRESOLINE) 25 MG tablet TAKE 1 AND 1/2 TABLETS THREE TIMES DAILY Patient taking differently: Take 7.5 mg by mouth 3 (three) times daily. 03/14/23   Camnitz, Andree Coss, MD  isosorbide dinitrate (ISORDIL) 20 MG tablet TAKE 1 TABLET THREE  TIMES DAILY 03/14/23   Camnitz, Andree Coss, MD  Multiple Vitamins-Minerals (CENTRUM SILVER PO) Take by mouth.    [provider]  Multiple Vitamins-Minerals (PRESERVISION AREDS 2) CAPS Take 1 capsule by mouth 3 (three) times daily.    [provider]  potassium chloride (MICRO-K) 10 MEQ CR capsule Take 10 mEq by mouth daily. 11/08/22   [provider]  sodium bicarbonate 650 MG tablet Take 1 tablet (650 mg total) by mouth 2 (two) times daily. 07/19/23 07/18/24  [name], MD      Allergies    Patient has no known allergies.    Review of Systems   Review of Systems  Respiratory:   Positive for shortness of breath.   Review of systems completed and notable as per HPI.  ROS otherwise negative.   Physical Exam Updated Vital Signs BP (!) 141/77 (BP Location: Left Leg)   Pulse (!) 59   Temp (!) 97.5 F (36.4 C) (Oral)   Resp 15   Wt 77.1 kg   SpO2 99%   BMI 25.10 kg/m  Physical Exam Vitals and nursing note reviewed.  Constitutional:      General: He is not in acute distress.    Appearance: He is well-developed.  HENT:     Head: Normocephalic and atraumatic.     Mouth/Throat:     Mouth: Mucous membranes are moist.  Eyes:     Extraocular Movements: Extraocular movements intact.     Conjunctiva/sclera: Conjunctivae normal.     Pupils: Pupils are equal, round, and reactive to light.  Cardiovascular:     Rate and Rhythm: Normal rate and regular rhythm.     Pulses: Normal pulses.     Heart sounds: Normal heart sounds. No murmur heard. Pulmonary:     Effort: Pulmonary effort is normal. No respiratory distress.     Breath sounds: Normal breath sounds.  Abdominal:     Palpations: Abdomen is soft.     Tenderness: There is no abdominal tenderness.  Musculoskeletal:        General: No swelling.     Cervical back: Neck supple.     Right lower leg: No edema.     Left lower leg: No edema.  Skin:    General: Skin is warm and dry.     Capillary Refill: Capillary refill takes less than 2 seconds.  Neurological:     General: No focal deficit present.     Mental Status: He is alert and oriented to person, place, and time.     Cranial Nerves: No cranial nerve deficit.     Motor: No weakness.  Psychiatric:        Mood and Affect: Mood normal.     ED Results / Procedures / Treatments   Labs (all labs ordered are listed, but only abnormal results are displayed) Labs Reviewed  COMPREHENSIVE METABOLIC PANEL - Abnormal; Notable for the following components:      Result Value   CO2 8 (*)    Glucose, Bld 111 (*)    BUN 174 (*)    Creatinine, Ser 15.84 (*)     Calcium 7.3 (*)    Total Protein 6.0 (*)    Albumin 3.0 (*)    GFR, Estimated 3 (*)    Anion gap 24 (*)    All other components within normal limits  CBC WITH DIFFERENTIAL/PLATELET - Abnormal; Notable for the following components:   RBC 2.83 (*)    Hemoglobin 8.6 (*)  HCT 27.2 (*)    RDW 16.2 (*)    Platelets 144 (*)    nRBC 0.9 (*)    Abs Immature Granulocytes 0.11 (*)    All other components within normal limits  BRAIN NATRIURETIC PEPTIDE - Abnormal; Notable for the following components:   B Natriuretic Peptide >4,500.0 (*)    All other components within normal limits  TROPONIN I (HIGH SENSITIVITY) - Abnormal; Notable for the following components:   Troponin I (High Sensitivity) 1,570 (*)    All other components within normal limits  TROPONIN I (HIGH SENSITIVITY) - Abnormal; Notable for the following components:   Troponin I (High Sensitivity) 1,590 (*)    All other components within normal limits  RESP PANEL BY RT-PCR (RSV, FLU A&B, COVID)  [id number]  TSH  I-STAT CG4 LACTIC ACID, ED    EKG EKG Interpretation Date/Time:  Saturday September 28 2023 14:34:29 EST Ventricular Rate:  64 PR Interval:  206 QRS Duration:  158 QT Interval:  508 QTC Calculation: 524 R Axis:   -87  Text Interpretation: AV dual-paced rhythm with occasional atrial-paced complexes Abnormal ECG When compared with ECG of 17-Jul-2023 13:24, PREVIOUS ECG IS PRESENT Confirmed by Fulton Reek [phone]) on 09/28/2023 3:02:12 PM  Radiology DG Chest 2 View Result Date: 09/28/2023 CLINICAL DATA:  Shortness of breath. EXAM: CHEST - 2 VIEW COMPARISON:  07/17/2023. FINDINGS: Low lung volume. There are bilateral layering pleural effusions, right more than left and mild-to-moderate diffuse pulmonary vascular congestion. No pneumothorax. Mildly enlarged cardio-mediastinal silhouette, which is likely accentuated by low lung volume and AP technique. There is a left sided 2-lead pacemaker. No acute osseous abnormalities.  The soft tissues are within normal limits. IMPRESSION: *Findings favor congestive heart failure/pulmonary edema. Electronically Signed   By: Jules Schick M.D.   On: 09/28/2023 15:27    Procedures Procedures    Medications Ordered in ED Medications  acetaminophen (TYLENOL) tablet 650 mg (has no administration in time range)    Or  acetaminophen (TYLENOL) suppository 650 mg (has no administration in time range)  glycopyrrolate (ROBINUL) tablet 1 mg (has no administration in time range)    Or  glycopyrrolate (ROBINUL) injection 0.2 mg (has no administration in time range)    Or  glycopyrrolate (ROBINUL) injection 0.2 mg (has no administration in time range)  polyvinyl alcohol (LIQUIFILM TEARS) 1.4 % ophthalmic solution 1 drop (has no administration in time range)  LORazepam (ATIVAN) tablet 1 mg (has no administration in time range)    Or  LORazepam (ATIVAN) 2 MG/ML concentrated solution 1 mg (has no administration in time range)    Or  LORazepam (ATIVAN) injection 1 mg (has no administration in time range)  ondansetron (ZOFRAN) injection 4 mg (has no administration in time range)  morphine (PF) 2 MG/ML injection 2 mg (has no administration in time range)    ED Course/ Medical Decision Making/ A&P Clinical Course as of 09/28/23 2009  Sat Sep 28, 2023  1607 Discussed with hospitalist for palliative care admission. [JD]    Clinical Course User Index [JD] Laurence Spates, MD                                 Medical Decision Making Amount and/or Complexity of Data Reviewed Labs: ordered. Radiology: ordered.  Risk Decision regarding hospitalization.   Medical Decision Making:   Armin Bresson is a 89 y.o. male who presented to the ED today with  failure to thrive, shortness of breath.  All signs reviewed.  On exam he appears somewhat tired and fatigued.  History from patient and son are notable for worsening failure to thrive, poor appetite.  He is also felt short of breath and  has had worsening kidney function recently without taking his medications.  He has a DNR and MOST form here, however both him and son are okay with workup to evaluate for possible reversible causes such as pneumonia, UTI, metabolic abnormality.  I am worried he may have worsening renal failure.  With patient and son are adamant that he would not want dialysis under any circumstances.   Patient placed on continuous vitals and telemetry monitoring while in ED which was reviewed periodically.  Reviewed and confirmed nursing documentation for past medical history, family history, social history.   Reassessment and Plan:   Reviewed patient's lab work.  Is notable for acute renal failure with uremia and markedly elevated creatinine.  Electrolytes notable for low bicarb.  He has severely elevated BNP as well as severely elevated troponin.  His chest x-ray is concerning for volume overload.  I suspect his troponinemia is demand in setting of heart failure exacerbation likely cardiorenal syndrome.  He is anion gap, lactic acid added on.  I talked with patient, his son Zaiden who is his medical decision-maker if he is unable to as well as his son's wife.  I reviewed the workup concerning for acute renal failure with cardiorenal syndrome and NSTEMI.  Patient still feels well.  I reviewed with him that he would potentially dialysis and quite aggressive care for improvement of his metabolic status, kidney function, cardiorenal syndrome.  Patient and his family are adamant that he does not want any sort of dialysis or aggressive treatment.  Both patient and his family expressed desire to focus on comfort and feel like he is at end-of-life.  Patient was clear that he does not want aggressive management and would rather just focus on comfort.  He states he would not want medicines to try to prolong things as he does not feel like his kidneys are going to recover.  He has not had any urine output in the last 24 hours.  Patient  would like to be admitted to set up home palliative/hospice care.  I talked with the hospitalist and he was admitted for further management.   Patient's presentation is most consistent with acute presentation with potential threat to life or bodily function.           Final Clinical Impression(s) / ED Diagnoses Final diagnoses:  Acute renal failure superimposed on stage 5 chronic kidney disease, not on chronic dialysis, unspecified acute renal failure type [facility])    Rx / DC Orders ED Discharge Orders     None         Laurence Spates, MD 09/28/23 2009

## 2023-09-28 NOTE — ED Notes (Signed)
Admitting MD at [facility] speaking with pt and family

## 2023-09-29 DIAGNOSIS — I2489 Other forms of acute ischemic heart disease: Secondary | ICD-10-CM | POA: Diagnosis present

## 2023-09-29 DIAGNOSIS — Z8546 Personal history of malignant neoplasm of prostate: Secondary | ICD-10-CM | POA: Diagnosis not present

## 2023-09-29 DIAGNOSIS — N179 Acute kidney failure, unspecified: Secondary | ICD-10-CM | POA: Diagnosis present

## 2023-09-29 DIAGNOSIS — I132 Hypertensive heart and chronic kidney disease with heart failure and with stage 5 chronic kidney disease, or end stage renal disease: Secondary | ICD-10-CM | POA: Diagnosis present

## 2023-09-29 DIAGNOSIS — Z515 Encounter for palliative care: Secondary | ICD-10-CM | POA: Diagnosis not present

## 2023-09-29 DIAGNOSIS — Z833 Family history of diabetes mellitus: Secondary | ICD-10-CM | POA: Diagnosis not present

## 2023-09-29 DIAGNOSIS — Z8616 Personal history of COVID-19: Secondary | ICD-10-CM | POA: Diagnosis not present

## 2023-09-29 DIAGNOSIS — I2510 Atherosclerotic heart disease of native coronary artery without angina pectoris: Secondary | ICD-10-CM | POA: Diagnosis present

## 2023-09-29 DIAGNOSIS — Z1152 Encounter for screening for COVID-19: Secondary | ICD-10-CM | POA: Diagnosis not present

## 2023-09-29 DIAGNOSIS — Z950 Presence of cardiac pacemaker: Secondary | ICD-10-CM | POA: Diagnosis not present

## 2023-09-29 DIAGNOSIS — I5043 Acute on chronic combined systolic (congestive) and diastolic (congestive) heart failure: Secondary | ICD-10-CM | POA: Diagnosis present

## 2023-09-29 DIAGNOSIS — Z7189 Other specified counseling: Secondary | ICD-10-CM

## 2023-09-29 DIAGNOSIS — Z66 Do not resuscitate: Secondary | ICD-10-CM | POA: Diagnosis present

## 2023-09-29 DIAGNOSIS — Z555 Less than a high school diploma: Secondary | ICD-10-CM | POA: Diagnosis not present

## 2023-09-29 DIAGNOSIS — R627 Adult failure to thrive: Secondary | ICD-10-CM | POA: Diagnosis present

## 2023-09-29 DIAGNOSIS — Z7901 Long term (current) use of anticoagulants: Secondary | ICD-10-CM | POA: Diagnosis not present

## 2023-09-29 DIAGNOSIS — Z79899 Other long term (current) drug therapy: Secondary | ICD-10-CM | POA: Diagnosis not present

## 2023-09-29 DIAGNOSIS — Z955 Presence of coronary angioplasty implant and graft: Secondary | ICD-10-CM | POA: Diagnosis not present

## 2023-09-29 DIAGNOSIS — Z789 Other specified health status: Secondary | ICD-10-CM

## 2023-09-29 DIAGNOSIS — R0902 Hypoxemia: Secondary | ICD-10-CM | POA: Diagnosis present

## 2023-09-29 DIAGNOSIS — E872 Acidosis, unspecified: Secondary | ICD-10-CM | POA: Diagnosis present

## 2023-09-29 DIAGNOSIS — Z801 Family history of malignant neoplasm of trachea, bronchus and lung: Secondary | ICD-10-CM | POA: Diagnosis not present

## 2023-09-29 DIAGNOSIS — Z8249 Family history of ischemic heart disease and other diseases of the circulatory system: Secondary | ICD-10-CM | POA: Diagnosis not present

## 2023-09-29 DIAGNOSIS — N185 Chronic kidney disease, stage 5: Secondary | ICD-10-CM | POA: Diagnosis present

## 2023-09-29 DIAGNOSIS — D631 Anemia in chronic kidney disease: Secondary | ICD-10-CM | POA: Diagnosis present

## 2023-09-29 DIAGNOSIS — Z634 Disappearance and death of family member: Secondary | ICD-10-CM | POA: Diagnosis not present

## 2023-09-29 NOTE — Discharge Summary (Signed)
Physician Discharge Summary  Steven Rosario WUJ:[id number] DOB: 06/07/1929 DOA: 09/28/2023  PCP: Lise Auer, MD  Admit date: 09/28/2023 Discharge date: 09/29/2023  Admitted From: home Disposition:  residential hospice  Home Health:none Equipment/Devices:none  Discharge Condition:hospice CODE STATUS:dnr Diet recommendation: regular  Brief/Interim Summary:  89 y.o. male with medical history significant of stage V CKD, combined systolic and diastolic heart failure, CAD, complete heart block status post pacemaker, anemia of chronic disease, hypertension presenting to the hospital for failure to thrive and shortness of breath.   Patient lives alone with son staying about 5 minutes away.  He was found by family with generalized weakness and decreased appetite along with failure to thrive and shortness of breath.  Family called EMS who found that patient was hypoxic to 88% and placed him on 2 L nasal cannula.   Per patient and family, he has experienced declining health over the last 3 to 4 weeks.  His appetite has diminished significantly and he has experienced generalized weakness.  He also notes 1 episode of emesis earlier today.  Per family, patient saw nephrology 2 weeks ago with worsening kidney function though patient has opted not to pursue dialysis.  Patient and family would like to pursue comfort focused care near end-of-life.  Son states that they discussed hospice care with patient and feel that this is appropriate.  Patient states that he is ready to go see his wife who passed away 6 years ago. Discharge Diagnoses:  Principal Problem:   Acute renal failure superimposed on stage 5 chronic kidney disease, not on chronic dialysis, unspecified acute renal failure type [facility]) Active Problems:   Pacemaker [employer] device implant date 08/23/2017   Coronary artery disease   Acute combined systolic and diastolic heart failure (HCC)   Acute renal failure superimposed on stage 5 chronic  kidney disease, not on chronic dialysis (HCC)     #1 AKI on CKD stage V with combined diastolic and systolic heart failure patient opted for not to have dialysis. He wants to go to residential hospice.  He is being discharged to residential hospice today.family aware.  Estimated body mass index is 25.1 kg/m as calculated from the following:   Height as of 08/15/23: 5\' 9"  (1.753 m).   Weight as of this encounter: 77.1 kg.  Discharge Instructions   Allergies as of 09/29/2023   No Known Allergies      Medication List     STOP taking these medications    amLODipine 5 MG tablet Commonly known as: NORVASC   apixaban 2.5 MG Tabs tablet Commonly known as: ELIQUIS   ascorbic acid 500 MG tablet Commonly known as: VITAMIN C   atorvastatin 20 MG tablet Commonly known as: LIPITOR   carvedilol 12.5 MG tablet Commonly known as: COREG   CENTRUM SILVER PO   cyanocobalamin 1000 MCG tablet Commonly known as: VITAMIN B12   ferrous sulfate 325 (65 FE) MG EC tablet   FIBER PO   hydrALAZINE 25 MG tablet Commonly known as: APRESOLINE   isosorbide dinitrate 20 MG tablet Commonly known as: ISORDIL   mirtazapine 15 MG tablet Commonly known as: REMERON   potassium chloride 10 MEQ CR capsule Commonly known as: MICRO-K   PreserVision AREDS 2 Caps   sodium bicarbonate 650 MG tablet   torsemide 20 MG tablet Commonly known as: DEMADEX        No Known Allergies  Consultations: palliative   Procedures/Studies: DG Chest 2 View Result Date: 09/28/2023 CLINICAL DATA:  Shortness  of breath. EXAM: CHEST - 2 VIEW COMPARISON:  07/17/2023. FINDINGS: Low lung volume. There are bilateral layering pleural effusions, right more than left and mild-to-moderate diffuse pulmonary vascular congestion. No pneumothorax. Mildly enlarged cardio-mediastinal silhouette, which is likely accentuated by low lung volume and AP technique. There is a left sided 2-lead pacemaker. No acute osseous  abnormalities. The soft tissues are within normal limits. IMPRESSION: *Findings favor congestive heart failure/pulmonary edema. Electronically Signed   By: Jules Schick M.D.   On: 09/28/2023 15:27   (Echo, Carotid, EGD, Colonoscopy, ERCP)    Subjective: Resting in bed   Discharge Exam: Vitals:   09/28/23 2030 09/29/23 0818  BP: 138/73 123/65  Pulse: (!) 59 60  Resp: 16 16  Temp: 97.6 F (36.4 C) (!) 97.4 F (36.3 C)  SpO2: 98% 99%   Vitals:   09/28/23 1357 09/28/23 1846 09/28/23 2030 09/29/23 0818  BP:  (!) 141/77 138/73 123/65  Pulse:  (!) 59 (!) 59 60  Resp:   16 16  Temp:  (!) 97.5 F (36.4 C) 97.6 F (36.4 C) (!) 97.4 F (36.3 C)  TempSrc:  Oral Oral Oral  SpO2:  99% 98% 99%  Weight: 77.1 kg       General: Pt is alert, awake, not in acute distress Cardiovascular: reg Respiratory:cta Abdominal: Soft, NT, ND, bowel sounds + Extremities: edema  The results of significant diagnostics from this hospitalization (including imaging, microbiology, ancillary and laboratory) are listed below for reference.     Microbiology: Recent Results (from the past 240 hours)  Resp panel by RT-PCR (RSV, Flu A&B, Covid) Anterior Nasal Swab     Status: None   Collection Time: 09/28/23  1:54 PM   Specimen: Anterior Nasal Swab  Result Value Ref Range Status   SARS Coronavirus 2 by RT PCR NEGATIVE NEGATIVE Final   Influenza A by PCR NEGATIVE NEGATIVE Final   Influenza B by PCR NEGATIVE NEGATIVE Final    Comment: (NOTE) The Xpert Xpress SARS-CoV-2/FLU/RSV plus assay is intended as an aid in the diagnosis of influenza from Nasopharyngeal swab specimens and should not be used as a sole basis for treatment. Nasal washings and aspirates are unacceptable for Xpert Xpress SARS-CoV-2/FLU/RSV testing.  Fact Sheet for Patients: BloggerCourse.com  Fact Sheet for Healthcare Providers: SeriousBroker.it  This test is not yet approved or  cleared by the [place] FDA and has been authorized for detection and/or diagnosis of SARS-CoV-2 by FDA under an Emergency Use Authorization (EUA). This EUA will remain in effect (meaning this test can be used) for the duration of the COVID-19 declaration under Section 564(b)(1) of the Act, 21 U.S.C. section 360bbb-3(b)(1), unless the authorization is terminated or revoked.     Resp Syncytial Virus by PCR NEGATIVE NEGATIVE Final    Comment: (NOTE) Fact Sheet for Patients: BloggerCourse.com  Fact Sheet for Healthcare Providers: SeriousBroker.it  This test is not yet approved or cleared by the [place] FDA and has been authorized for detection and/or diagnosis of SARS-CoV-2 by FDA under an Emergency Use Authorization (EUA). This EUA will remain in effect (meaning this test can be used) for the duration of the COVID-19 declaration under Section 564(b)(1) of the Act, 21 U.S.C. section 360bbb-3(b)(1), unless the authorization is terminated or revoked.  Performed at [facility], [address] [place], [place] [address]      Labs: BNP (last 3 results) Recent Labs    11/28/22 1122 07/17/23 2000 09/28/23 1405  BNP 1,925.6* 1,683.8* >4,500.0*  Basic Metabolic Panel: Recent Labs  Lab 09/28/23 1405  NA 141  K 5.0  CL 109  CO2 8*  GLUCOSE 111*  BUN 174*  CREATININE 15.84*  CALCIUM 7.3*   Liver Function Tests: Recent Labs  Lab 09/28/23 1405  AST 26  ALT 18  ALKPHOS 55  BILITOT 1.0  PROT 6.0*  ALBUMIN 3.0*   No results for input(s): "LIPASE", "AMYLASE" in the last 168 hours. No results for input(s): "AMMONIA" in the last 168 hours. CBC: Recent Labs  Lab 09/28/23 1405  WBC 5.5  NEUTROABS 4.2  HGB 8.6*  HCT 27.2*  MCV 96.1  PLT 144*   Cardiac Enzymes: No results for input(s): "CKTOTAL", "CKMB", "CKMBINDEX", "TROPONINI" in the last 168 hours. BNP: Invalid input(s): "POCBNP" CBG: No  results for input(s): "GLUCAP" in the last 168 hours. D-Dimer No results for input(s): "DDIMER" in the last 72 hours. Hgb A1c No results for input(s): "HGBA1C" in the last 72 hours. Lipid Profile No results for input(s): "CHOL", "HDL", "LDLCALC", "TRIG", "CHOLHDL", "LDLDIRECT" in the last 72 hours. Thyroid function studies Recent Labs    09/28/23 1405  TSH 2.830   Anemia work up No results for input(s): "VITAMINB12", "FOLATE", "FERRITIN", "TIBC", "IRON", "RETICCTPCT" in the last 72 hours. Urinalysis    Component Value Date/Time   COLORURINE STRAW (A) 07/17/2023 1417   APPEARANCEUR CLEAR 07/17/2023 1417   LABSPEC 1.006 07/17/2023 1417   PHURINE 5.0 07/17/2023 1417   GLUCOSEU NEGATIVE 07/17/2023 1417   HGBUR NEGATIVE 07/17/2023 1417   BILIRUBINUR NEGATIVE 07/17/2023 1417   KETONESUR NEGATIVE 07/17/2023 1417   PROTEINUR NEGATIVE 07/17/2023 1417   NITRITE NEGATIVE 07/17/2023 1417   LEUKOCYTESUR NEGATIVE 07/17/2023 1417   Sepsis Labs Recent Labs  Lab 09/28/23 1405  WBC 5.5   Microbiology Recent Results (from the past 240 hours)  Resp panel by RT-PCR (RSV, Flu A&B, Covid) Anterior Nasal Swab     Status: None   Collection Time: 09/28/23  1:54 PM   Specimen: Anterior Nasal Swab  Result Value Ref Range Status   SARS Coronavirus 2 by RT PCR NEGATIVE NEGATIVE Final   Influenza A by PCR NEGATIVE NEGATIVE Final   Influenza B by PCR NEGATIVE NEGATIVE Final    Comment: (NOTE) The Xpert Xpress SARS-CoV-2/FLU/RSV plus assay is intended as an aid in the diagnosis of influenza from Nasopharyngeal swab specimens and should not be used as a sole basis for treatment. Nasal washings and aspirates are unacceptable for Xpert Xpress SARS-CoV-2/FLU/RSV testing.  Fact Sheet for Patients: BloggerCourse.com  Fact Sheet for Healthcare Providers: SeriousBroker.it  This test is not yet approved or cleared by the [place] FDA and has been  authorized for detection and/or diagnosis of SARS-CoV-2 by FDA under an Emergency Use Authorization (EUA). This EUA will remain in effect (meaning this test can be used) for the duration of the COVID-19 declaration under Section 564(b)(1) of the Act, 21 U.S.C. section 360bbb-3(b)(1), unless the authorization is terminated or revoked.     Resp Syncytial Virus by PCR NEGATIVE NEGATIVE Final    Comment: (NOTE) Fact Sheet for Patients: BloggerCourse.com  Fact Sheet for Healthcare Providers: SeriousBroker.it  This test is not yet approved or cleared by the [place] FDA and has been authorized for detection and/or diagnosis of SARS-CoV-2 by FDA under an Emergency Use Authorization (EUA). This EUA will remain in effect (meaning this test can be used) for the duration of the COVID-19 declaration under Section 564(b)(1) of the Act, 21 U.S.C. section 360bbb-3(b)(1), unless  the authorization is terminated or revoked.  Performed at [facility], [address], [place], [place] [address]      Time coordinating discharge: 37 minutes  SIGNED:  Alwyn Ren, MD  [facility] 09/29/2023, 4:11 PM

## 2023-09-29 NOTE — Progress Notes (Signed)
Chaplain responds to page relaying pt request for visit after pt has been shifted to comfort care and plans are made for him to move to hospice care. Pt engages in life review and shares that he has many things he needs to do before he dies. He speaks of his wife Lucy's death six years ago and how nothing has been the same since. He is tearful as he shares the good fortune he's had and all he's accomplished. Chaplain offers affirmation, validation, and encouragement, as well as prayer. She encourages pt and his son, after the visit, to reach out with spiritual concerns.

## 2023-09-29 NOTE — TOC Transition Note (Signed)
Transition of Care [facility]) - Discharge Note   Patient Details  Name: Steven Rosario MRN: [id number] Date of Birth: [birth year]  Transition of Care [facility]) CM/SW Contact:  Deatra Robinson, [place] Phone Number: 09/29/2023, 4:11 PM   Clinical Narrative:  Pt for dc to [facility]) today. Spoke to [place] with [facility] who confirmed pt's son has completed consents and they are prepared to admit pt today. Pt's son aware and dc and reports agreeable. RN provided with number for report and PTAR arranged for transport. SW signing off at dc.   Dellie Burns, MSW, LCSW [phone] (coverage)      Final next level of care: Hospice Medical Facility Barriers to Discharge: Barriers Resolved   Patient Goals and CMS Choice            Discharge Placement                Patient to be transferred to facility by: PTAR Name of family member notified: Letcher/son Patient and family notified of of transfer: 09/29/23  Discharge Plan and Services Additional resources added to the After Visit Summary for                                       Social Drivers of Health (SDOH) Interventions SDOH Screenings   Food Insecurity: No Food Insecurity (09/29/2023)  Housing: Low Risk  (09/29/2023)  Transportation Needs: No Transportation Needs (09/29/2023)  Utilities: Not At Risk (09/29/2023)  Alcohol Screen: Low Risk  (11/02/2022)  Financial Resource Strain: Low Risk  (11/02/2022)  Tobacco Use: Low Risk  (09/28/2023)     Readmission Risk Interventions    12/04/2022   11:28 AM 11/30/2022    4:26 PM  Readmission Risk Prevention Plan  Transportation Screening Complete Complete  PCP or Specialist Appt within 3-5 Days Complete   HRI or Home Care Consult Complete Complete  Palliative Care Screening Not Applicable Not Applicable  Medication Review (RN Care Manager) Complete Complete

## 2023-09-29 NOTE — Progress Notes (Signed)
PROGRESS NOTE    Steven Rosario  ZOX:[id number] DOB: 12-Apr-1929 DOA: 09/28/2023 PCP: Lise Auer, MD   Brief Narrative:  89 y.o. male with medical history significant of stage V CKD, combined systolic and diastolic heart failure, CAD, complete heart block status post pacemaker, anemia of chronic disease, hypertension presenting to the hospital for failure to thrive and shortness of breath.   Patient lives alone with son staying about 5 minutes away.  He was found by family with generalized weakness and decreased appetite along with failure to thrive and shortness of breath.  Family called EMS who found that patient was hypoxic to 88% and placed him on 2 L nasal cannula.   Per patient and family, he has experienced declining health over the last 3 to 4 weeks.  His appetite has diminished significantly and he has experienced generalized weakness.  He also notes 1 episode of emesis earlier today.  Per family, patient saw nephrology 2 weeks ago with worsening kidney function though patient has opted not to pursue dialysis.  Patient and family would like to pursue comfort focused care near end-of-life.  Son states that they discussed hospice care with patient and feel that this is appropriate.  Patient states that he is ready to go see his wife who passed away 6 years ago.    Assessment & Plan:   Principal Problem:   Acute renal failure superimposed on stage 5 chronic kidney disease, not on chronic dialysis, unspecified acute renal failure type [facility]) Active Problems:   Pacemaker Saint Jude device implant date 08/23/2017   Coronary artery disease   Acute combined systolic and diastolic heart failure (HCC)    #1 AKI on CKD stage V with combined diastolic and systolic heart failure patient opted for not to have dialysis.  He wants to go to residential hospice.  This is being arranged.  Currently continue comfort care as patient is comfort care.  Await residential hospice placement.  Estimated  body mass index is 25.1 kg/m as calculated from the following:   Height as of 08/15/23: 5\' 9"  (1.753 m).   Weight as of this encounter: 77.1 kg.   Subjective: Patient resting comfortably in no acute distress  Objective: Vitals:   09/28/23 1357 09/28/23 1846 09/28/23 2030 09/29/23 0818  BP:  (!) 141/77 138/73 123/65  Pulse:  (!) 59 (!) 59 60  Resp:   16 16  Temp:  (!) 97.5 F (36.4 C) 97.6 F (36.4 C) (!) 97.4 F (36.3 C)  TempSrc:  Oral Oral Oral  SpO2:  99% 98% 99%  Weight: 77.1 kg       Intake/Output Summary (Last 24 hours) at 09/29/2023 1323 Last data filed at 09/29/2023 1209 Gross per 24 hour  Intake 360 ml  Output 0 ml  Net 360 ml   Filed Weights   09/28/23 1357  Weight: 77.1 kg    Examination:  General exam: Appears calm and comfortable  Respiratory system: Crackles at the bases Cardiovascular system: Regular Gastrointestinal system: Abdomen is nondistended, soft and nontender. No organomegaly or masses felt. Normal bowel sounds heard. Central nervous system: Alert and oriented. No focal neurological deficits. Extremities: 1+ pitting edema.  Data Reviewed: I have personally reviewed following labs and imaging studies  CBC: Recent Labs  Lab 09/28/23 1405  WBC 5.5  NEUTROABS 4.2  HGB 8.6*  HCT 27.2*  MCV 96.1  PLT 144*   Basic Metabolic Panel: Recent Labs  Lab 09/28/23 1405  NA 141  K 5.0  CL 109  CO2 8*  GLUCOSE 111*  BUN 174*  CREATININE 15.84*  CALCIUM 7.3*   GFR: Estimated Creatinine Clearance: 3 mL/min (A) (by C-G formula based on SCr of 15.84 mg/dL (H)). Liver Function Tests: Recent Labs  Lab 09/28/23 1405  AST 26  ALT 18  ALKPHOS 55  BILITOT 1.0  PROT 6.0*  ALBUMIN 3.0*   No results for input(s): "LIPASE", "AMYLASE" in the last 168 hours. No results for input(s): "AMMONIA" in the last 168 hours. Coagulation Profile: No results for input(s): "INR", "PROTIME" in the last 168 hours. Cardiac Enzymes: No results for  input(s): "CKTOTAL", "CKMB", "CKMBINDEX", "TROPONINI" in the last 168 hours. BNP (last 3 results) No results for input(s): "PROBNP" in the last 8760 hours. HbA1C: No results for input(s): "HGBA1C" in the last 72 hours. CBG: No results for input(s): "GLUCAP" in the last 168 hours. Lipid Profile: No results for input(s): "CHOL", "HDL", "LDLCALC", "TRIG", "CHOLHDL", "LDLDIRECT" in the last 72 hours. Thyroid Function Tests: Recent Labs    09/28/23 1405  TSH 2.830   Anemia Panel: No results for input(s): "VITAMINB12", "FOLATE", "FERRITIN", "TIBC", "IRON", "RETICCTPCT" in the last 72 hours. Sepsis Labs: Recent Labs  Lab 09/28/23 1555  LATICACIDVEN 1.0    Recent Results (from the past 240 hours)  Resp panel by RT-PCR (RSV, Flu A&B, Covid) Anterior Nasal Swab     Status: None   Collection Time: 09/28/23  1:54 PM   Specimen: Anterior Nasal Swab  Result Value Ref Range Status   SARS Coronavirus 2 by RT PCR NEGATIVE NEGATIVE Final   Influenza A by PCR NEGATIVE NEGATIVE Final   Influenza B by PCR NEGATIVE NEGATIVE Final    Comment: (NOTE) The Xpert Xpress SARS-CoV-2/FLU/RSV plus assay is intended as an aid in the diagnosis of influenza from Nasopharyngeal swab specimens and should not be used as a sole basis for treatment. Nasal washings and aspirates are unacceptable for Xpert Xpress SARS-CoV-2/FLU/RSV testing.  Fact Sheet for Patients: BloggerCourse.com  Fact Sheet for Healthcare Providers: SeriousBroker.it  This test is not yet approved or cleared by the [place] FDA and has been authorized for detection and/or diagnosis of SARS-CoV-2 by FDA under an Emergency Use Authorization (EUA). This EUA will remain in effect (meaning this test can be used) for the duration of the COVID-19 declaration under Section 564(b)(1) of the Act, 21 U.S.C. section 360bbb-3(b)(1), unless the authorization is terminated or revoked.     Resp  Syncytial Virus by PCR NEGATIVE NEGATIVE Final    Comment: (NOTE) Fact Sheet for Patients: BloggerCourse.com  Fact Sheet for Healthcare Providers: SeriousBroker.it  This test is not yet approved or cleared by the [place] FDA and has been authorized for detection and/or diagnosis of SARS-CoV-2 by FDA under an Emergency Use Authorization (EUA). This EUA will remain in effect (meaning this test can be used) for the duration of the COVID-19 declaration under Section 564(b)(1) of the Act, 21 U.S.C. section 360bbb-3(b)(1), unless the authorization is terminated or revoked.  Performed at [facility], [address] [place], [place] [address]      Radiology Studies: DG Chest 2 View Result Date: 09/28/2023 CLINICAL DATA:  Shortness of breath. EXAM: CHEST - 2 VIEW COMPARISON:  07/17/2023. FINDINGS: Low lung volume. There are bilateral layering pleural effusions, right more than left and mild-to-moderate diffuse pulmonary vascular congestion. No pneumothorax. Mildly enlarged cardio-mediastinal silhouette, which is likely accentuated by low lung volume and AP technique. There is a left sided 2-lead pacemaker.  No acute osseous abnormalities. The soft tissues are within normal limits. IMPRESSION: *Findings favor congestive heart failure/pulmonary edema. Electronically Signed   By: Jules Schick M.D.   On: 09/28/2023 15:27   Scheduled Meds: Continuous Infusions:   LOS: 0 days    Time spent: 32 minutes  Alwyn Ren, MD  09/29/2023, 1:23 PM

## 2023-09-29 NOTE — Care Management Obs Status (Signed)
MEDICARE OBSERVATION STATUS NOTIFICATION   Patient Details  Name: Steven Rosario MRN: [id number] Date of Birth: Sep 01, 1929   Medicare Observation Status Notification Given:  Yes    Ronny Bacon, RN 09/29/2023, 8:35 AM

## 2023-09-29 NOTE — Progress Notes (Signed)
Patient has been reviewed and approved for admission to [facility].  Family is in agreement.  Dellie Burns, SW has been notified. Feel free to call with any questions or concerns. Evette Doffing, RN BSN [facility] [phone]

## 2023-09-29 NOTE — TOC Progression Note (Addendum)
Per Palliative Care APP, pt's son agreeable to comfort measures and requesting hospice home placement at [facility] [place]. Spoke to pt's son Johnavon [phone] who confirmed plan for hospice home placement and request for [facility] due to location. Referral made to [facility] with [facility] who will evaluate pt and f/u with pt's son. MD and Palliative Care APP updated.   UPDATE 1540: pt has been accepted to [facility] and they are able to admit today. Pt's son aware, has completed admission consents, and is agreeable to dc plan. MD updated.    Dellie Burns, MSW, LCSW [phone] (coverage)

## 2023-09-29 NOTE — Consult Note (Incomplete)
Consultation Note Date: 09/29/2023   Patient Name: Steven Rosario  DOB: 12-16-1928  MRN: [id number]  Age / Sex: 89 y.o., male  PCP: Lise Auer, MD Referring Physician: Alwyn Ren, MD  Reason for Consultation: {Reason for Consult:23484}  HPI/Patient Profile: 89 y.o. male  with past medical history of *** admitted on 09/28/2023 with ***.     Clinical Assessment and Goals of Care: I have reviewed medical records including EPIC notes, labs, available advanced directives, and imaging. Received report from primary RN - ***   Went to visit patient at bedside - *** family/visitors present. Patient was lying in bed awake, alert, oriented, and able to participate in conversation***. No signs or non-verbal gestures of pain or discomfort noted. No respiratory distress, increased work of breathing, or secretions noted. ***  Met with ***  to discuss diagnosis, prognosis, GOC, EOL wishes, disposition, and options.  I introduced Palliative Medicine as specialized medical care for people living with serious illness. It focuses on providing relief from the symptoms and stress of a serious illness. The goal is to improve quality of life for both the patient and the family.  We discussed a brief life review of the patient as well as functional and nutritional status.   We discussed patient's current illness and what it means in the larger context of patient's on-going co-morbidities.  Natural disease trajectory and expectations at EOL were discussed. I attempted to elicit values and goals of care important to the patient. The difference between aggressive medical intervention and comfort care was considered in light of the patient's goals of care.   Hospice and Palliative Care services outpatient were explained and offered.  Advance directives, concepts specific to code status, artificial feeding and hydration, and  rehospitalization were considered and discussed.  Visit also consisted of discussions dealing with the complex and emotionally intense issues of symptom management and palliative care in the setting of serious and potentially life-threatening illness. Palliative care team will continue to support patient, patient's family, and medical team.  Discussed with patient/family the importance of continued conversation with each other*** and the medical providers regarding overall plan of care and treatment options, ensuring decisions are within the context of the patients values and GOCs.    Questions and concerns were addressed. The patient/family was encouraged to call with questions and/or concerns. PMT card was provided.   Primary Decision Maker: {Primary Decision XBJYN:82956}    SUMMARY OF RECOMMENDATIONS   ***   Code Status/Advance Care Planning: {Palliative Code status:23503}  Palliative Prophylaxis:  {Palliative Prophylaxis:21015}  Additional Recommendations (Limitations, Scope, Preferences): {Recommended Scope and Preferences:21019}  Psycho-social/Spiritual:  Desire for further Chaplaincy support:{YES NO:22349} Created space and opportunity for patient and family*** to express thoughts and feelings regarding patient's current medical situation.  Emotional support and therapeutic listening provided.  Prognosis:  {Palliative Care Prognosis:23504}  Discharge Planning: {Palliative dispostion:23505}      Primary Diagnoses: Present on Admission:  Acute renal failure superimposed on stage 5 chronic kidney disease, not on chronic dialysis, unspecified acute renal failure type (HCC)  Acute combined systolic and diastolic heart failure (HCC)  Pacemaker Saint Jude device implant date 08/23/2017  Coronary artery disease   I have reviewed the medical record, interviewed the patient and family, and examined the patient. The following aspects are pertinent.  Past Medical History:   Diagnosis Date   Acute kidney injury (AKI) with acute tubular necrosis (ATN) (HCC)    Cervical disc disease    Chronic kidney disease  Coronary artery disease    Gout    Hypertension    Nephrolithiasis    Pneumonia due to COVID-19 virus    Presence of stent in LAD coronary artery    Prostate cancer (HCC)    S/P placement of cardiac pacemaker    Urinary incontinence    Social History   Socioeconomic History   Marital status: Widowed    Spouse name: Not on file   Number of children: 4   Years of education: Not on file   Highest education level: 8th grade  Occupational History   Occupation: retired   Tobacco Use   Smoking status: Never   Smokeless tobacco: Never  Vaping Use   Vaping status: Never Used  Substance and Sexual Activity   Alcohol use: Not Currently    Comment: past   Drug use: Never   Sexual activity: Not on file  Other Topics Concern   Not on file  Social History Narrative   Not on file   Social Drivers of Health   Financial Resource Strain: Low Risk  (11/02/2022)   Overall Financial Resource Strain (CARDIA)    Difficulty of Paying Living Expenses: Not very hard  Food Insecurity: No Food Insecurity (07/17/2023)   Hunger Vital Sign    Worried About Running Out of Food in the Last Year: Never true    Ran Out of Food in the Last Year: Never true  Transportation Needs: No Transportation Needs (07/17/2023)   PRAPARE - Administrator, Civil Service (Medical): No    Lack of Transportation (Non-Medical): No  Physical Activity: Not on file  Stress: Not on file  Social Connections: Not on file   Family History  Problem Relation Age of Onset   Diabetes Mother    Obesity Mother    Heart failure Mother    CAD Father    Bone cancer Father    Heart disease Sister    Obesity Brother    Lung cancer Son    Scheduled Meds: Continuous Infusions: PRN Meds:.acetaminophen **OR** acetaminophen, glycopyrrolate **OR** glycopyrrolate **OR**  glycopyrrolate, LORazepam **OR** LORazepam **OR** LORazepam, morphine injection, ondansetron (ZOFRAN) IV, polyvinyl alcohol Medications Prior to Admission:  Prior to Admission medications   Medication Sig Start Date End Date Taking? Authorizing Provider  amLODipine (NORVASC) 5 MG tablet Take 10 mg by mouth daily. 03/29/22   [provider]  apixaban (ELIQUIS) 2.5 MG TABS tablet Take 2.5 mg by mouth 2 (two) times daily. Pt taking 1 time a day    [provider]  atorvastatin (LIPITOR) 20 MG tablet Take 20 mg by mouth daily.    [provider]  benzonatate (TESSALON) 100 MG capsule Take 100 mg by mouth 3 (three) times daily as needed. 07/05/23   [provider]  carvedilol (COREG) 12.5 MG tablet Take 1 tablet (12.5 mg total) by mouth 2 (two) times daily. 07/17/21   Camnitz, Andree Coss, MD  cyanocobalamin (VITAMIN B12) 1000 MCG tablet Take 1,000 mcg by mouth daily.    [provider]  ferrous sulfate 325 (65 FE) MG EC tablet Take 325 mg by mouth 3 (three) times a week.    [provider]  FIBER PO Take 1 tablet by mouth daily.    [provider]  furosemide (LASIX) 40 MG tablet Take 1 tablet (40 mg total) by mouth daily. Please dont take lasix if weight is less than 181 pounds. 12/14/22   Clegg, Amy D, NP  hydrALAZINE (APRESOLINE) 25 MG  tablet TAKE 1 AND 1/2 TABLETS THREE TIMES DAILY Patient taking differently: Take 7.5 mg by mouth 3 (three) times daily. 03/14/23   Camnitz, Andree Coss, MD  isosorbide dinitrate (ISORDIL) 20 MG tablet TAKE 1 TABLET THREE TIMES DAILY 03/14/23   Camnitz, Andree Coss, MD  Multiple Vitamins-Minerals (CENTRUM SILVER PO) Take by mouth.    [provider]  Multiple Vitamins-Minerals (PRESERVISION AREDS 2) CAPS Take 1 capsule by mouth 3 (three) times daily.    [provider]  potassium chloride (MICRO-K) 10 MEQ CR capsule Take 10 mEq by mouth daily. 11/08/22   [provider]  sodium  bicarbonate 650 MG tablet Take 1 tablet (650 mg total) by mouth 2 (two) times daily. 07/19/23 07/18/24  [name], MD   No Known Allergies Review of Systems  Physical Exam  Vital Signs: BP 123/65 (BP Location: Left Arm)   Pulse 60   Temp (!) 97.4 F (36.3 C) (Oral)   Resp 16   Wt 77.1 kg   SpO2 99%   BMI 25.10 kg/m  Pain Scale: 0-10 POSS *See Group Information*: 1-Acceptable,Awake and alert Pain Score: 0-No pain   SpO2: SpO2: 99 % O2 Device:SpO2: 99 % O2 Flow Rate: .   IO: Intake/output summary:  Intake/Output Summary (Last 24 hours) at 09/29/2023 0941 Last data filed at 09/29/2023 0200 Gross per 24 hour  Intake 120 ml  Output 0 ml  Net 120 ml    LBM:   Baseline Weight: Weight: 77.1 kg Most recent weight: Weight: 77.1 kg     Palliative Assessment/Data:     Time In: *** Time Out: *** Time Total: ***  Signed by: Haskel Khan, NP   Please contact Palliative Medicine Team phone at [phone] for questions and concerns.  For individual provider: See Amion  *Portions of this note are a verbal dictation therefore any spelling and/or grammatical errors are due to the "Dragon Medical One" system interpretation.

## 2023-09-30 ENCOUNTER — Encounter (HOSPITAL_COMMUNITY): Payer: Medicare HMO

## 2023-10-02 DEATH — deceased

## 2023-10-14 ENCOUNTER — Encounter (HOSPITAL_COMMUNITY): Payer: Medicare HMO

## 2023-11-11 ENCOUNTER — Ambulatory Visit: Payer: Medicare PPO

## 2024-02-10 ENCOUNTER — Ambulatory Visit: Payer: Medicare PPO

## 2024-05-11 ENCOUNTER — Ambulatory Visit: Payer: Medicare PPO

## 2024-08-10 ENCOUNTER — Ambulatory Visit: Payer: Medicare PPO

## 2024-11-09 ENCOUNTER — Ambulatory Visit: Payer: Medicare PPO
